# Patient Record
Sex: Male | Born: 1950
Health system: Southern US, Community
[De-identification: ages and names within clinical notes are randomized; demographics above are authoritative.]

## PROBLEM LIST (undated history)

## (undated) DIAGNOSIS — N419 Inflammatory disease of prostate, unspecified: Secondary | ICD-10-CM

## (undated) DIAGNOSIS — M75100 Unspecified rotator cuff tear or rupture of unspecified shoulder, not specified as traumatic: Secondary | ICD-10-CM

## (undated) DIAGNOSIS — R413 Other amnesia: Secondary | ICD-10-CM

## (undated) DIAGNOSIS — E785 Hyperlipidemia, unspecified: Secondary | ICD-10-CM

## (undated) DIAGNOSIS — I10 Essential (primary) hypertension: Secondary | ICD-10-CM

## (undated) DIAGNOSIS — Z87442 Personal history of urinary calculi: Secondary | ICD-10-CM

## (undated) DIAGNOSIS — C801 Malignant (primary) neoplasm, unspecified: Secondary | ICD-10-CM

## (undated) DIAGNOSIS — R569 Unspecified convulsions: Secondary | ICD-10-CM

## (undated) DIAGNOSIS — Z9989 Dependence on other enabling machines and devices: Secondary | ICD-10-CM

## (undated) DIAGNOSIS — T8859XA Other complications of anesthesia, initial encounter: Secondary | ICD-10-CM

## (undated) DIAGNOSIS — F32A Depression, unspecified: Secondary | ICD-10-CM

## (undated) DIAGNOSIS — E119 Type 2 diabetes mellitus without complications: Secondary | ICD-10-CM

## (undated) DIAGNOSIS — G4733 Obstructive sleep apnea (adult) (pediatric): Secondary | ICD-10-CM

## (undated) DIAGNOSIS — C642 Malignant neoplasm of left kidney, except renal pelvis: Secondary | ICD-10-CM

## (undated) DIAGNOSIS — N289 Disorder of kidney and ureter, unspecified: Secondary | ICD-10-CM

## (undated) DIAGNOSIS — R51 Headache: Secondary | ICD-10-CM

## (undated) DIAGNOSIS — N189 Chronic kidney disease, unspecified: Secondary | ICD-10-CM

## (undated) HISTORY — DX: Disorder of kidney and ureter, unspecified: N28.9

## (undated) HISTORY — DX: Unspecified convulsions: R56.9

## (undated) HISTORY — DX: Dependence on other enabling machines and devices: Z99.89

## (undated) HISTORY — DX: Essential (primary) hypertension: I10

## (undated) HISTORY — PX: OTHER SURGICAL HISTORY: SHX169

## (undated) HISTORY — DX: Malignant neoplasm of left kidney, except renal pelvis: C64.2

## (undated) HISTORY — DX: Unspecified rotator cuff tear or rupture of unspecified shoulder, not specified as traumatic: M75.100

## (undated) HISTORY — DX: Obstructive sleep apnea (adult) (pediatric): G47.33

## (undated) HISTORY — DX: Other amnesia: R41.3

## (undated) HISTORY — DX: Inflammatory disease of prostate, unspecified: N41.9

## (undated) HISTORY — DX: Hyperlipidemia, unspecified: E78.5

## (undated) HISTORY — DX: Headache: R51

## (undated) HISTORY — PX: INCISIONAL HERNIA REPAIR: SHX193

## (undated) HISTORY — PX: SKIN CANCER DESTRUCTION: SHX778

## (undated) HISTORY — DX: Malignant (primary) neoplasm, unspecified: C80.1

## (undated) HISTORY — DX: Chronic kidney disease, unspecified: N18.9

---

## 1998-07-30 ENCOUNTER — Emergency Department (HOSPITAL_COMMUNITY): Admission: EM | Admit: 1998-07-30 | Discharge: 1998-07-30 | Payer: Self-pay | Admitting: Emergency Medicine

## 2002-12-06 ENCOUNTER — Ambulatory Visit (HOSPITAL_BASED_OUTPATIENT_CLINIC_OR_DEPARTMENT_OTHER): Admission: RE | Admit: 2002-12-06 | Discharge: 2002-12-06 | Payer: Self-pay | Admitting: Orthopedic Surgery

## 2004-06-24 ENCOUNTER — Ambulatory Visit (HOSPITAL_COMMUNITY): Admission: RE | Admit: 2004-06-24 | Discharge: 2004-06-24 | Payer: Self-pay | Admitting: *Deleted

## 2004-06-24 ENCOUNTER — Encounter (INDEPENDENT_AMBULATORY_CARE_PROVIDER_SITE_OTHER): Payer: Self-pay | Admitting: Specialist

## 2005-02-15 ENCOUNTER — Emergency Department (HOSPITAL_COMMUNITY): Admission: EM | Admit: 2005-02-15 | Discharge: 2005-02-15 | Payer: Self-pay | Admitting: Emergency Medicine

## 2005-02-20 ENCOUNTER — Emergency Department (HOSPITAL_COMMUNITY): Admission: EM | Admit: 2005-02-20 | Discharge: 2005-02-20 | Payer: Self-pay | Admitting: Emergency Medicine

## 2005-10-25 HISTORY — PX: KIDNEY SURGERY: SHX687

## 2006-03-23 ENCOUNTER — Emergency Department (HOSPITAL_COMMUNITY): Admission: EM | Admit: 2006-03-23 | Discharge: 2006-03-24 | Payer: Self-pay | Admitting: Emergency Medicine

## 2006-04-07 ENCOUNTER — Encounter (INDEPENDENT_AMBULATORY_CARE_PROVIDER_SITE_OTHER): Payer: Self-pay | Admitting: Specialist

## 2006-04-07 ENCOUNTER — Inpatient Hospital Stay (HOSPITAL_COMMUNITY): Admission: RE | Admit: 2006-04-07 | Discharge: 2006-04-10 | Payer: Self-pay | Admitting: Urology

## 2006-05-17 ENCOUNTER — Ambulatory Visit (HOSPITAL_COMMUNITY): Admission: RE | Admit: 2006-05-17 | Discharge: 2006-05-17 | Payer: Self-pay | Admitting: Urology

## 2007-07-13 ENCOUNTER — Ambulatory Visit (HOSPITAL_COMMUNITY): Admission: RE | Admit: 2007-07-13 | Discharge: 2007-07-14 | Payer: Self-pay | Admitting: General Surgery

## 2007-07-19 ENCOUNTER — Inpatient Hospital Stay (HOSPITAL_COMMUNITY): Admission: EM | Admit: 2007-07-19 | Discharge: 2007-07-22 | Payer: Self-pay | Admitting: Emergency Medicine

## 2007-12-06 ENCOUNTER — Encounter: Admission: RE | Admit: 2007-12-06 | Discharge: 2007-12-06 | Payer: Self-pay | Admitting: Internal Medicine

## 2008-01-08 ENCOUNTER — Ambulatory Visit (HOSPITAL_COMMUNITY): Admission: RE | Admit: 2008-01-08 | Discharge: 2008-01-08 | Payer: Self-pay | Admitting: *Deleted

## 2009-04-30 DIAGNOSIS — C4491 Basal cell carcinoma of skin, unspecified: Secondary | ICD-10-CM

## 2009-04-30 HISTORY — DX: Basal cell carcinoma of skin, unspecified: C44.91

## 2009-10-29 DIAGNOSIS — D229 Melanocytic nevi, unspecified: Secondary | ICD-10-CM

## 2009-10-29 HISTORY — DX: Melanocytic nevi, unspecified: D22.9

## 2010-04-14 ENCOUNTER — Ambulatory Visit (HOSPITAL_COMMUNITY): Admission: RE | Admit: 2010-04-14 | Discharge: 2010-04-14 | Payer: Self-pay | Admitting: Oncology

## 2010-11-15 ENCOUNTER — Encounter: Payer: Self-pay | Admitting: Internal Medicine

## 2011-03-09 NOTE — Discharge Summary (Signed)
NAMEMarland Kitchen  ABDO, DENAULT NO.:  [id number]   MEDICAL RECORD NO.:  [id number]          PATIENT TYPE:  INP   LOCATION:  1417                         FACILITY:  [facility]   PHYSICIAN:  Madaline Savage, MD        DATE OF BIRTH:  [birth year]   DATE OF ADMISSION:  07/19/2007  DATE OF DISCHARGE:  07/22/2007                               DISCHARGE SUMMARY   PRIMARY CARE PHYSICIAN:  Massie Maroon, M.D.   CONSULTATION:  He was seen by Dr. Lesia Sago from neurology.   DISCHARGE DIAGNOSES:  1. Altered mental status secondary to seizures.  2. Seizure disorder.  3. Dyslipidemia.  4. Pulmonary nodule.  5. History of renal cell carcinoma.   DISCHARGE MEDICATIONS:  1. Flomax 0.4 mg daily.  2. Prozac 20 mg daily.  3. Zocor 40 mg daily.  4. Lamictal 200 mg once daily.  5. Keppra 500 mg 3 times daily.  6. Norvasc 10 mg once daily.  7. Zithromax 250 mg daily for 3 more days.  8. Vicodin 5/325 1 tablet twice daily as needed.  9. Ambien 10 mg at bedtime as needed.   HISTORY OF PRESENT ILLNESS:  For full history and physical, see the  history and physical dictated by Dr. Christella Noa on July 19, 2007.  In  short, Mr. Mendiola is a 60 year old gentleman with a history of  depression and seizures who was brought to the ED by his wife with acute  confusion, unsteady gait, and lightheadedness.  He was recently  discharged from the hospital on July 13, 2007 by Dr. Abbey Chatters  after he had a lumbar hernia surgery.  He was apparently on narcotic  pain medications for it.  He is admitted with a diagnosis of altered  mental status for further evaluation and management.   PROCEDURES PERFORMED:  1. He had a CT of the head with and without contrast done on July 19, 2007 which showed no acute abnormalities, but it did show      paranasal sinus disease.  2. He had a CT of the chest without contrast done on July 20, 2007 which showed mild pulmonary edema and stable left  lower      nodule.  3. He had an MRI and MRA of the brain done July 20, 2007 which      showed no acute ischemic event, no occlusions, stenosis, or      dissection in the intracranial vessels noted.  4. He had an EEG done on July 20, 2007 which showed abnormal slow      EEG with a central dominant rhythm of about 6 Hz and high amplitude      sharp wave and occasionally spike wave discharges which appeared      bilaterally frontal and frontopolar dominant.  This is a clearly      abnormal EEG.   PROBLEMS:  1. Altered mental status.  Mr. Kielty altered mental status      resolved in the first 24-48 hours.  This is deemed secondary to  his      seizure.  He had an MRI/MRA which did not show any ischemic event.  2. Seizure disorder.  He was seen by neurology, and he was started on      Keppra.  He was on Lamictal at home.  His Lamictal dose is at 200      mg a day at this time, and the plan is to titrate it up to 250 mg.      He is on Keppra 500 mg 3 times daily.  We will check a Lamictal      level as an outpatient in a week's time.  3. Sinusitis.  He did have some nasal discharge, and he had a CT of      the head which showed paranasal sinusitis.  He was started on      Zithromax.  He will be continued on it for 3 more days.  4. Hypertension.  His blood pressure was elevated on admission, and he      was started on Norvasc.  His blood pressure is well-controlled on      his current dose of Norvasc.  We will continue it at this time.   DISPOSITION:  He is now being discharged home in stable condition.   FOLLOW UP:  He is asked to follow up with his primary care doctor, Dr.  Selena Batten, in 1 week.  He also asked to follow up with his neurologist, Dr.  Anne Hahn, in 1-2 weeks.  He is recommend to get a Lamictal level checked  before he sees Dr. Anne Hahn.      Madaline Savage, MD  Electronically Signed     PKN/MEDQ  D:  07/22/2007  T:  07/23/2007  Job:  [id number]   cc:   Massie Maroon,  MD  Fax: [phone]   C. Lesia Sago, M.D.  Fax: [phone]   Adolph Pollack, M.D.  [address]  [place]  [place] [address]

## 2011-03-09 NOTE — Op Note (Signed)
NAME:  REEDY, BIERNAT NO.:  [id number]   MEDICAL RECORD NO.:  [id number]          PATIENT TYPE:  AMB   LOCATION:  ENDO                         FACILITY:  [facility]   PHYSICIAN:  Georgiana Spinner, M.D.    DATE OF BIRTH:  [birth year]   DATE OF PROCEDURE:  01/08/2008  DATE OF DISCHARGE:                               OPERATIVE REPORT   PROCEDURE:  Upper endoscopy.   INDICATIONS:  Hemoccult positivity.   ANESTHESIA:  Fentanyl 75 mcg, Versed 7.5 mg.   DESCRIPTION OF PROCEDURE:  With the patient mildly sedated in the left  lateral decubitus position the Pentax videoscopic endoscope was inserted  in the mouth, and passed under direct vision through the esophagus,  which appeared normal; into the stomach.  Fundus, body, antrum, duodenal  bulb, and second portion of the duodenum all appeared normal.  From this  point the endoscope was slowly withdrawn taking circumferential views of  duodenal mucosa until the endoscope had been pulled back, and the  stomach placed in retroflexion to view the stomach from below.  The  endoscope was then straightened and withdrawn taking circumferential  views of the remaining gastric and esophageal mucosa; stopping, in the  fundus, to photograph an area where there was a small fleck of blood  seen, but without any underlying pathology.  The endoscope was withdrawn  taking circumferential views of the remaining gastric and esophageal  mucosa.  The patient's vital signs, and pulse oximeter remained stable.  The patient tolerated the procedure well without apparent complications.   FINDINGS:  A small amount of blood seen in the stomach etiology of which  was not clear from this exam.   PLAN:  Proceed to colonoscopy.           ______________________________  Georgiana Spinner, M.D.     GMO/MEDQ  D:  01/08/2008  T:  01/08/2008  Job:  [id number]

## 2011-03-09 NOTE — Procedures (Signed)
EEG NUMBER:  [id number].   This patient is followed by Dr. Lesia Sago as a neurologic consultant.  The primary attending is not mentioned. This is a portable EEG study on  this patient without hyperventilation and performed without photic  stimulation maneuvers.  The patient is described as admitted with  confusion, disorientation, poor mobility and weakness.  The mental  status changes were the reason for the EEG evaluation.   CURRENT MEDICATIONS:  1. Lovenox.  2. Aspirin.  3. Protonix.  4. Zocor.  5. Aggrenox.  6. Norvasc.  7. Lamictal.  8. Phenergan.  9. Vasotec.  10.Ativan.  11.Oxycodone.  12.Prozac.   DESCRIPTION:  A posterior dominant rhythm cannot be established.  This  EEG shows an ongoing seizure activity with high amplitude sharp waves as  well as artifact over the left temporal region.  Remarkable is that the  EKG remains in normal sinus rhythm throughout at about 80-88 beats per  minute.  High amplitude discharges are seen throughout the EEG recording  in transverse, bilateral and referential montages.  There is also  intermittent movement noticed which the technician noted as feet  movement.  She did not notice any convulsions.   CONCLUSION:  This is an abnormal slow EEG with a central dominant rhythm  of about 6 Hz and high amplitude sharp wave and occasionally spike wave  discharges which appear bilaterally frontal and frontal polar dominant.  This is clearly an abnormal EEG.      Melvyn Novas, M.D.  Electronically Signed     EA:VWUJ  D:  07/20/2007 19:33:41  T:  07/21/2007 12:09:58  Job #:  [id number]   cc:   Marlan Palau, M.D.  Fax: [phone]

## 2011-03-09 NOTE — Op Note (Signed)
NAME:  JOHNAVON, MCCLAFFERTY NO.:  [id number]   MEDICAL RECORD NO.:  [id number]          PATIENT TYPE:  AMB   LOCATION:  ENDO                         FACILITY:  [facility]   PHYSICIAN:  Georgiana Spinner, M.D.    DATE OF BIRTH:  1951-01-13   DATE OF PROCEDURE:  DATE OF DISCHARGE:                               OPERATIVE REPORT   PROCEDURE:  Colonoscopy.   INDICATIONS:  Colon cancer screening, hemoccult positivity.   ANESTHESIA:  Fentanyl 50 mcg, Versed 5 mg.   PROCEDURE:  With the patient mildly sedated in the left lateral  decubitus position, a rectal exam was performed which was unremarkable.  The prostate felt normal.  Subsequently, the Pentax videoscopic  colonoscope was inserted in the rectum and passed under direct vision to  the cecum, identified by ileocecal valve and appendiceal orifice, both  of which were photographed.  From this point the colonoscope was slowly  withdrawn, taking circumferential views of the colonic mucosa, stopping  only in the rectum which appeared normal on direct and retroflexed view.  The endoscope was straightened and withdrawn.  The patient's vital signs  and pulse oximeter remained stable.  The patient tolerated the procedure  well without apparent complication.   FINDINGS:  Negative colonoscopic examination.   PLAN:  Consider repeat examination in 5-10 years.           ______________________________  Georgiana Spinner, M.D.     GMO/MEDQ  D:  01/08/2008  T:  01/08/2008  Job:  [id number]

## 2011-03-09 NOTE — H&P (Signed)
NAMESLOAN, TAKAGI NO.:  [id number]   MEDICAL RECORD NO.:  [id number]          PATIENT TYPE:  INP   LOCATION:  1417                         [facility]ACILITY:  [facility]   PHYSICIAN:  Herbie Saxon, MDDATE OF BIRTH:  08-20-1951   DATE OF ADMISSION:  07/19/2007  DATE OF DISCHARGE:                              HISTORY & PHYSICAL   PRIMARY CARE PHYSICIAN:  Massie Maroon, M.D.   PRESENTING COMPLAINT:  Confusion of 1 day duration.   HISTORY OF PRESENTING COMPLAINT:  This is a 60 year old Caucasian male  who was quite well, according to wife, until this morning, when he  became suddenly acutely confused, associated with unsteadiness of gait,  lightheadedness, and dizziness.  There was no weakness or seizure.  The  patient has a past medical history of multiple partial  seizures, and  wife has noticed that the patient has been having auditory and  visualizations for the last 3 days, which she was attributing to the  oxycodone he was placed on post his hernia surgery.  The patient was  just discharged from the hospital on July 13, 2007, status post  left lumbar hernia surgery by Dr. Zoila Shutter.  He had been quite well  until 2 days ago, when he was having constipation.  He used 2 laxative  tablets, and then subsequently developed diarrhea.  He had been having,  according to the wife, low-grade fever associated cough with blood-  streaked mucoid phlegm in the last 3 days.  No wheezing, no shortness of  breath, no syncopal episodes.  The patient has not been complaining of  headache, blurred vision, or difficulty with hearing to her.   PAST MEDICAL HISTORY:  1. Kidney cancer.  2. Depression.  3. Seizures.  4. BPH.  5. Chronic prostatitis.   PAST SURGERIES:  Left kidney removal in 2007.   FAMILY HISTORY:  Father died of throat cancer.  Mother is alive.  She  has breast cancer.   The patient has been married 34 years.  He is an Equities trader.  There are no  kids.   Note:  The patient is now recognizing the wife.  He is still confused  and is only saying I don't know to all the questions presented to him.  Review of systems could not be obtained, as the patient is too confused  and has no memory of recent events.  There are no signs of lip or tongue  biting or trauma to the extremities.   The patient has been diagnosed in the last 1 year with elevated blood  pressure but has not been put on any medications for this.   There are no known drug allergies.   MEDICATIONS AT HOME:  1. Fluoxetine 20 mg daily.  2. Zocor 40 mg daily.  3. Oxycodone/APAP 5/500, 1 q.6 h. p.r.n.   PHYSICAL EXAMINATION:  GENERAL:  He is an elderly man, not in acute  respiratory distress.  He is confused, pleasantly.  Currently not  agitated.  He is not oriented in time, place, or person.  VITAL SIGNS:  Temperature is 98, pulse  is 86, respiratory rate is 20,  blood pressure 155/103.  HEENT:  Right pupil is 2 mm, left pupil is 4 mm, sluggishly reacting.  NECK:  Supple.  CHEST:  Clear.  HEART:  Heart sounds 1 and 2 regular.  No murmurs.  ABDOMEN : There is left lumbar ecchymosis at the surgery site.  Mild  bulge in the left lumbar region.  No organomegaly.  Bowel sounds are  normoactive.  NEUROLOGIC:  Cranial nerves and sensory system could not be tested.  Power is 5 globally.  Deep tendon reflexes 2 globally.  There is no  dysarthria.  There is no obvious nystagmus or ataxia.  SKIN:  There are no skin ulcers, no pedal edema.   Available labs showed urine toxicology is negative.  Alcohol level is  less than 5.  Chest x-ray shows mild peribronchial thickening, otherwise  normal.  EKG shows normal sinus rhythm at 78 per minute, with left  atrial enlargement.  WBC is 10, hematocrit 41, platelet count is 343.  Urinalysis is negative.  Troponin is less than 0.05.  Chemistry shows a  sodium of 140, potassium 4.7, chloride 104, bicarbonate 24, BUN 22,  creatinine 1.3,  glucose is 134.   ASSESSMENT:  Altered mental status, rule out seizure ?brain metastases .  Rule out psychiatric manifestation.  Query underlying cerebrovascular  accident.  Rule out brain metastases.  Rule out lung metastases from the  kidney cancer.  The patient is to be admitted to a telemetry bed.  Will  obtain a CT brain and CT chest.  Consider doing MRI brain and MRA head  and neck.  His diet will be low cholesterol, cardiac.  IV fluids of  normal saline at 25 cc/hour.  Will obtain ESR, RPR, and Hemoccult.  Will  get a sputum analysis and culture.  Will put him on Aggrenox 1 tablet  b.i.d.  Will continue with his fluoxetine 20 mg p.o. daily.  Will get  physical therapy and speech therapy to evaluate him.  Hold off with his  oxycodone.  Tylenol 650 mg p.r.n. for pain.  DVT prophylaxis with  Lovenox 40 mg subcutaneously daily, and Phenergan 12.5 mg IV q.8 h.  p.r.n. nausea.  Start him on amlodipine 10 mg daily, and enalapril 2.5  mg IV q.6 h. p.r.n. for blood pressure greater than 160/109.  Will  obtain an EEG.  Consider neurology and psychiatric input after reviewing  the labs and radiology tests.  Rule out viral encephalitis.  Will  consider starting the patient on Rocephin and acyclovir if  neurologically not improving.  Possibly also consider doing a lumbar  puncture for CSF analysis.      Herbie Saxon, MD  Electronically Signed     MIO/MEDQ  D:  07/19/2007  T:  07/20/2007  Job:  [id number]   cc:   Massie Maroon, MD  Fax: [phone]

## 2011-03-09 NOTE — Op Note (Signed)
NAME:  AVYUKT, CIMO NO.:  [id number]   MEDICAL RECORD NO.:  [id number]          PATIENT TYPE:  AMB   LOCATION:  DAY                          FACILITY:  [facility]   PHYSICIAN:  Adolph Pollack, M.D.DATE OF BIRTH:  30-Apr-1951   DATE OF PROCEDURE:  07/13/2007  DATE OF DISCHARGE:                               OPERATIVE REPORT   PREOPERATIVE DIAGNOSIS:  Left flank ventral incisional hernia.   POSTOPERATIVE DIAGNOSIS:  Left flank ventral incisional hernia.   PROCEDURE:  Laparoscopic repair of left flank incisional hernia with  mesh and mobilization of splenic flexure.   SURGEON:  Adolph Pollack, M.D.   ASSISTANT:  Leonie Man, M.D.   ANESTHESIA:  General.   INDICATION:  The patient is a 60 year old male who underwent a left  nephrectomy for renal cell carcinoma.  He had been noticing a bulge in  the incision and he states he can hear gurgling out of it sometimes.  On  examination, he has an obvious hernia and upon reduction, it appears  that his bowel is being reduced.  He now presents for laparoscopic,  possible open repair.   TECHNIQUE:  He was seen in the holding area and then brought to the  operating room, placed supine on the operating table.  General  anesthetic was administered.  He was then placed into the right lateral  decubitus position and padded appropriately.  The hair on the abdominal  wall and flank area was clipped and then the abdominal wall flank and  left back were sterilely prepped and draped.  The patient was then  turned into a near supine position and a transverse incision was made in  the supraumbilical region through the skin and subcutaneous tissue.  The  midline fascia was identified and a small incision made in it and the  peritoneal cavity was entered.  A pursestring suture of 0 Vicryl was  placed around the fascial edges.  A Hassan trocar was introduced into  the peritoneal cavity and the pneumoperitoneum was created by  insufflation of CO2 gas.   Next, the angled laparoscope was introduced.  I then placed a 5 mm  trocar into the left lower quadrant region and one into the upper  midline just to the left of the midline.  I identified one hernia  defect.  The patient was then placed back in the decubitus position.  I  perform adhesiolysis of some of the omentum that was around the hernias.  I then identified the left colon and found that part of it was going up  into a second hernia which was more lateral.  There was a medial defect  with a normal fascial bridge and then a lateral defect present.  Using  careful sharp and blunt dissection, I dissect the colon free from the  lateral hernia without injuring it.  I then dissected some of the  extraperitoneal fat away from the rim of both hernias.  I end up having  to mobilize the splenic flexure of the colon in order to have an area of  abdominal wall for anchoring purposes.  I did this sharply and bluntly  without injury to the spleen or colon.  I then lysed more adhesions of  scar exposing more abdominal wall laterally.  Once I had both hernias  identified with adequate abdominal wall around them for overlap, I then  brought a piece of Parietex mesh with a nonadherent barrier into the  field measuring 20 x 15.  I placed four anchoring sutures of #1 Novofil  at four quadrants of the mesh.  I then hydrated the mesh and then placed  it into abdominal cavity, positioned it so that the rough side was  facing upward and the nonadherent barrier side was facing the viscera.  I then made four stab incisions.  The superior stab incision was made  just over the twelfth rib and I had the anesthetist hold  him in end  expiration for this.  Through the four stab incisions, I threaded up the  stay sutures across a fascial bridge and then tied these down, initially  anchoring it to the abdominal wall.  I then further anchored to the mesh  to the abdominal wall with spiral  tacks of both the outer and inner  layer.  This provided for more than adequate overlap and good anchoring.   I then irrigated the area and inspected the colon once again and small  bile and noticed no intestinal injury.  There was no bleeding noted.  I  then evacuated the irrigation fluid and released the CO2 and watch the  viscera approximate the mesh.  Trocars were then removed.   The supraumbilical fascial defect was closed by tightening up and tying  down the pursestring tissue.  All incisions were closed with 4-0  Monocryl subcuticular stitches followed by sterile dressings.   He tolerated the procedure well without any apparent complications and  was taken to the recovery room in satisfactory condition.      Adolph Pollack, M.D.  Electronically Signed     TJR/MEDQ  D:  07/13/2007  T:  07/13/2007  Job:  [id number]   cc:   Veverly Fells. Vernie Ammons, M.D.  Fax: [phone]   Janae Bridgeman. Eloise Harman., M.D.  Fax: [phone]

## 2011-03-09 NOTE — Consult Note (Signed)
NAME:  Kenneth Ortega, Kenneth Ortega NO.:  [id number]   MEDICAL RECORD NO.:  [id number]          PATIENT TYPE:  INP   LOCATION:  1417                         FACILITY:  [facility]   PHYSICIAN:  Marlan Palau, M.D.  DATE OF BIRTH:  01/28/1951   DATE OF CONSULTATION:  07/19/2007  DATE OF DISCHARGE:                                 CONSULTATION   HISTORY OF PRESENT ILLNESS:  The patient is a 59 year old right-handed  white male born 01/17/1951, with a history of seizure disorder  and renal cell carcinoma status post resection.  This patient comes to  the [facility] Emergency Room after being noted to be somewhat  confused this morning.  Onset was sometime between 5:30 a.m. and 7  o'clock a.m.  The patient has not noted any numbness or weakness of arms  or legs.  Denies headache, but has had significant confusion that  mainly manifests itself with language disorder with dysnomia  perseveration.  Patient has undergone a CT scan of the brain that is  unremarkable.  He is admitted for evaluation.  The patient has been on  Lamictal prior to coming in.  The wife is not sure with the dosing  regimen is.  Neurology is called for an evaluation.   PAST MEDICAL HISTORY:  Significant for  1. Acute onset of confusion and aphasia, rule out stroke versus      seizure.  2. History of seizures.  3. Hernia repair a week ago.  4. Nephrectomy secondary to renal cell carcinoma on the left.  5. Benign prostatic hypertrophy.  6. Renal calculi.   MEDICATIONS:  1. Prozac 20 mg daily.  2. Zocor 40 mg daily.  3. Tylox p.r.n.  4. Lamictal unknown dose.   The patient does not smoke or drink,   ALLERGIES:  No known allergies.   SOCIAL HISTORY:  Patient is married, has no children, lives in the  [place], [place], area.   FAMILY MEDICAL HISTORY:  Notable that mother is alive with rheumatoid  arthritis and breast cancer.  Father died with throat cancer.  The  patient has one  brother and two sisters, all alive and well, some have  borderline diabetes.   REVIEW OF SYSTEMS:  Notable for no fevers, chills.  Denies headache,  vision changes, trouble swallowing, chest pain, shortness of breath,  abdominal pain, problems controlling bowels or bladder, numbness,  weakness on the arms or legs.   PHYSICAL EXAMINATION:  VITALS:  Blood pressure is 153/97, heart rate 85,  respiratory 20, temperature afebrile.  GENERAL:  This patient is a fairly well-developed white male who is  alert, cooperative at the time of examination.  HEENT:  Head is atraumatic.  Eyes:  Pupils equal, round and react to  light.  Disks are flat bilaterally.  NECK:  Supple.  No carotid bruits noted.  RESPIRATORY:  Examination is clear.  CARDIOVASCULAR:  Regular rate and rhythm.  No obvious murmurs or rubs  noted.  EXTREMITIES:  Without significant edema.  NEUROLOGIC:  Cranial nerves as above.  Facial symmetry is present.  The  patient has good sensation of the face to pinprick, soft touch  bilaterally.  Has good strength to facial muscles, muscles to head  turning, shrug bilaterally.  Speech is well enunciated but is at times  nonsensical, a lot of paraphasic errors, dysnomias.  Spontaneous speech  is significantly impaired.  Repetition is good, however.  Naming is very  poor.  The patient appears to have full extraocular movements, visual  fields are full.  Motor testing shows 5/5 strength in all fours.  Good  symmetric motor tone is noted throughout.  Sensory testing is intact to  pinprick, soft touch, vibratory sensation throughout.  The patient has  fair finger-nose-finger and toe-to-finger bilaterally.  Gait was not  tested.  Deep tendon reflexes depressed but symmetric.  Toes neutral  bilaterally.  No drift is seen in the upper extremities.  The patient  has a lot of perseveration.  The patient could not state place or date.   LABORATORY VALUES:  Notable for white count of 10, hemoglobin  of 14.2,  hematocrit 41.4, MCV 89.3, platelets of 343, sed rate of 52.  INR 1.1.  Sodium 140, potassium of 4.7, chloride of 104, CO2 24, glucose 134, BUN  of 22, creatinine 1.32.  Total bilirubin 0.5, alkaline phosphatase of  89, SGOT 27, SGPT of 30, total protein 7.6, albumin 3.7, calcium 9.5,  phos of 3.4, magnesium 2.3.  CK of 46, MB fraction 1.4, troponin-I less  than 0.05.  Urine drug screen negative.  Alcohol level less than 5.  Urinalysis reveals specific gravity of 1.020, pH of 7.   CT of the head is as above.   IMPRESSION:  Onset of confusion/aphasia.   This patient does have a history of seizures.  The patient is on  Lamictal, dose is not known.  Will need to evaluate this patient for  possible stroke or a postictal confusion.  Main deficit currently is an  aphasia/dysnomia syndrome.   PLAN:  1. Will obtain MRI of the brain.  2. MRI angiogram of intracranial vessels.  3. EEG study.  4. Continue Lamictal.  5. Will follow the patient's clinical course while in-house.  Will      need to discontinue the Depakote as this interacts significantly      with the Lamictal.  Will follow the patient's clinical course.      Marlan Palau, M.D.  Electronically Signed     CKW/MEDQ  D:  07/19/2007  T:  07/20/2007  Job:  [id number]   cc:   [facility]  [address]  [address].

## 2011-03-12 NOTE — Discharge Summary (Signed)
NAMEMarland Kitchen  Kenneth Ortega, Kenneth Ortega NO.:  [id number]   MEDICAL RECORD NO.:  [id number]          PATIENT TYPE:  INP   LOCATION:  1430                         FACILITY:  [facility]   PHYSICIAN:  Mark C. Vernie Ammons, M.D.  DATE OF BIRTH:  10-26-1950   DATE OF ADMISSION:  04/07/2006  DATE OF DISCHARGE:  04/10/2006                                 DISCHARGE SUMMARY   PRINCIPAL DIAGNOSIS:  Renal cell carcinoma of the left kidney.   OTHER DIAGNOSIS:  Benign prostatic hypertrophy with bladder outlet  obstruction.   MAJOR OPERATION:  Left radical nephrectomy.   DISPOSITION:  The patient is discharged home in stable and satisfactory  condition.  He is overall improved.  He is voiding without difficulty.  His  discharge medications will include his preadmission admission medications  with no change except for the addition of Demerol 50 mg tabs one to two  q.4h. p.r.n. pain, #40, and Flomax 0.4 mg daily.  Follow-up will be in my  office in 1 week for skin staple removal.  His activity will be limited to  no heavy lifting, straining or vigorous activity.  His diet will be  unrestricted.   BRIEF HISTORY:  The patient is a 60 year old white male who was found on CT  scan to have a left renal mass.  The scan was performed for left flank pain.  The mass appeared to be enhancing and was without evidence of any evidence  of metastatic disease.  He was therefore electively admitted for left  radical nephrectomy.  His entire history and physical was previously  dictated and will not be repeated at this time.   HOSPITAL COURSE:  On April 07, 2006, he was taken to the operating room,  where he underwent left radical nephrectomy without difficulty.  The evening  of his surgery his Foley was draining clear and his incision was intact, and  by the following morning his creatinine had elevated slightly to 1.6 from  his admission creatinine of 1.0.  Hemoglobin was stable at 12.2.  The  remainder of his  laboratory results were unremarkable.  By the following  morning he was doing well but his Foley catheter was removed and was he  unable to void the previous day, so I&O catheterization was reformed.  The  Foley was then replaced and Flomax was started.  He then underwent repeat  voiding trial on the following morning and voided without difficulty.  His  hemoglobin remained stable.  His creatinine on discharge was 1.5.  His  incision was healing well without any sign of infection.  He was felt ready  for discharge at that time.      Mark C. Vernie Ammons, M.D.  Electronically Signed     MCO/MEDQ  D:  05/17/2006  T:  05/17/2006  Job:  [id number]

## 2011-03-12 NOTE — Op Note (Signed)
NAME:  Kenneth Ortega, Kenneth Ortega NO.:  [id number]   MEDICAL RECORD NO.:  [id number]                   PATIENT TYPE:  AMB   LOCATION:  DSC                                  FACILITY:  [facility]   PHYSICIAN:  Loreta Ave, M.D.              DATE OF BIRTH:  08/14/1951   DATE OF PROCEDURE:  DATE OF DISCHARGE:                                 OPERATIVE REPORT   PREOPERATIVE DIAGNOSIS:  Left knee medial meniscal tear.   POSTOPERATIVE DIAGNOSIS:  Left knee medial lateral meniscal tear with grade  2-3 chondromalacia patella and medial femoral condyle.   PROCEDURE:  1. Left knee examination under anesthesia.  2. Arthroscopy.  3. Partial medial and lateral meniscectomy.  4. Chondroplasty of the medial and femoral condyle and patella.   SURGEON:  Dr. Loreta Ave.   ANESTHESIA:  Knee block with sedation.   SPECIMENS:  None.   CULTURES:  None.   COMPLICATIONS:  None.   DRESSINGS:  Sterile compressive.   DESCRIPTION OF PROCEDURE:  The patient was brought to the operating room,  placed on the operating table in the supine position.  After adequate  anesthesia had been obtained, the left knee was examined. Full motion, good  stability, good patellofemoral tracking, minimal crepitus of the  patellofemoral joint.  No instability.  Tourniquet and leg holder applied,  the leg was prepped and draped in the usual sterile fashion.  Three portals  were created, one superolateral, one each medial and lateral parapatellar.  Inflow catheter induced.  Knee distended.  Arthroscope introduced and  inspected.  Local grade 2-3 chondromalacia in the patella and over towards  the medial facet, treated with chondroplasty to a stable surface.  Good  tracking.  Trochlea looked good.  Hypertrophic synovitis removed.  No  substantial medial plica.  Medial compartment had grade 2-3 changes, weight  bearing dome medial femoral condyle, treated with chondroplasty.  Medial  meniscus  otherwise had extensive tearing over the posterior half, multiple  displaced removable fragments.  Posterior __________ with a shaver to a  stable rim.  Tapered into remaining meniscus, salvaging much in the anterior  half.  Lateral meniscus had complex tearing with a flap off the posterior  third and a radial tear in the middle third.  These were both saucerized  out, tapered down smoothly, retaining a fair amount of meniscus at  completion.  No significant degenerative changes laterally.  Cruciate  ligament was intact.  The entire knee was examined, no other findings were  appreciated.  Instruments were completely  removed.  Portals of the knee were injected with Marcaine.  Portals were  closed with 4-0 nylon.  Sterile compressive dressing applied.  Anesthesia  reversed.  Brought to the recovery room.  Tolerated the surgery well with no  complications.  Loreta Ave, M.D.    DFM/MEDQ  D:  12/06/2002  T:  12/06/2002  Job:  [id number]

## 2011-03-12 NOTE — Op Note (Signed)
NAME:  Kenneth Ortega, Kenneth Ortega NO.:  [id number]   MEDICAL RECORD NO.:  [id number]                   PATIENT TYPE:  AMB   LOCATION:  ENDO                                 FACILITY:  [facility]   PHYSICIAN:  Georgiana Spinner, M.D.                 DATE OF BIRTH:  June 26, 1951   DATE OF PROCEDURE:  06/24/2004  DATE OF DISCHARGE:                                 OPERATIVE REPORT   PROCEDURE:  Colonoscopy.   INDICATIONS:  Colon polyps.   ANESTHESIA:  An additional Demerol 30, Versed 8 mg.   DESCRIPTION OF PROCEDURE:  With patient mildly sedated in the left lateral  decubitus position, a rectal examination was performed which was  unremarkable.  Subsequently, the Olympus videoscopic colonoscope was  inserted in the rectum and passed under direct vision to the cecum,  identified by the ileocecal valve and appendiceal orifice.  In the cecum  were 2 polyps that were photographed and removed using snare cautery  technique setting of 20/200 blended current.  Both were suctioned into the  endoscope and retrieved.  The endoscope was then withdrawn, taking  circumferential views of the remaining colonic mucosa, stopping in the  descending colon where a polyp was seen, photographed, and removed using hot  biopsy forceps technique with the same setting, and then also we stopped in  the rectum where a polyp was seen just above the anal verge, and it too was  photographed and removed using hot biopsy forceps technique.  The endoscope  was then placed in retroflexion to view the anal canal from above.  It was  then straightened and withdrawn, taking circumferential views of the  remaining mucosa.  The patient's vital signs and pulse oximeter remained  stable.  The patient tolerated the procedure well without apparent  complications.   FINDINGS:  Polyp at cecum, descending colon, rectum.   PLAN:  1.  Await biopsy report.  2.  The patient will call me for results and follow up with me as  an      outpatient.                                               Georgiana Spinner, M.D.    GMO/MEDQ  D:  06/24/2004  T:  06/24/2004  Job:  [id number]
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
## 2011-03-12 NOTE — Op Note (Signed)
NAME:  BJ, MORLOCK NO.:  [id number]   MEDICAL RECORD NO.:  [id number]                   PATIENT TYPE:  AMB   LOCATION:  ENDO                                 FACILITY:  [facility]   PHYSICIAN:  Georgiana Spinner, M.D.                 DATE OF BIRTH:  09-25-1951   DATE OF PROCEDURE:  06/24/2004  DATE OF DISCHARGE:                                 OPERATIVE REPORT   PROCEDURE:  Upper endoscopy.   INDICATIONS:  Abdominal pain.   ANESTHESIA:  1.  Demerol 50 mg.  2.  Versed 7 mg.   DESCRIPTION OF PROCEDURE:  With patient mildly sedated in the left lateral  decubitus position, the Olympus videoscopic endoscope was inserted in the  mouth, passed under direct vision through the esophagus, which appeared  normal, into the stomach.  Fundus, body, antrum, duodenal bulb, and second  portion of duodenum appeared normal.  From this point, the endoscope was  slowly withdrawn, taking circumferential views of duodenal mucosa until the  endoscope then pulled back into the stomach, placed in retroflexion to view  the stomach from below.  The endoscope was straightened and withdrawn,  taking circumferential views of the remaining gastric and esophageal mucosa.  The patient's vital signs and pulse oximeter remained stable.  The patient  tolerated the procedure well without apparent complications.   FINDINGS:  Unremarkable examination.   PLAN:  Proceed to colonoscopy.                                               Georgiana Spinner, M.D.    GMO/MEDQ  D:  06/24/2004  T:  06/24/2004  Job:  [id number]

## 2011-03-12 NOTE — Op Note (Signed)
NAMEMarland Kitchen  BRAHM, BARBEAU NO.:  [id number]   MEDICAL RECORD NO.:  [id number]          PATIENT TYPE:  INP   LOCATION:  1430                         FACILITY:  [facility]   PHYSICIAN:  Mark C. Vernie Ammons, M.D.  DATE OF BIRTH:  July 02, 1951   DATE OF PROCEDURE:  04/07/2006  DATE OF DISCHARGE:                                 OPERATIVE REPORT   PREOPERATIVE DIAGNOSIS:  Left renal mass.   POSTOPERATIVE DIAGNOSIS:  Left renal mass.   PROCEDURE:  Left radical nephrectomy.   SURGEON:  Mark C. Vernie Ammons, M.D.   ASSISTANT SURGEON:  Terie Purser, M.D.   ANESTHESIA:  General endotracheal.   COMPLICATIONS:  None.   DRAINS:  16-French Foley catheter to straight drain.   ESTIMATED BLOOD LOSS:  200 mL.   SPECIMENS:  Left kidney and adrenal to pathology.   DISPOSITION:  Stable to postanesthesia care unit.   INDICATION FOR PROCEDURE:  Mr. Lindeman is a 60 year old male, who had a  recent episode of left flank pain.  He underwent evaluation and was found to  have an approximate 3.5-cm enhancing renal mass, involving the left upper  pole.  This tumor appeared to extend deep into the parenchyma, abuting the  hilum.  Because of the above, the patient was offered a left radical  nephrectomy.  The benefits and risks of the procedure were explained and his  consent was obtained.   DESCRIPTION OF PROCEDURE:  The patient was brought to the operating room and  properly identified.  A time out was performed to determine correct patient,  procedure, and side.  He was administered general anesthesia, given  preoperative antibiotics, then placed in a left flank position and prepped  and draped in a sterile fashion.  Careful attention was paid to proper  positioning to eliminate DVT, rhabdomyolysis, or neuropraxia.  We then made  an approximate 12-cm subcostal incision on the left.  Dissection was carried  down through the subcutaneous tissues.  The muscles were divided in three  layers.  Our  dissection was just off of the 12th rib.  We incised the  lumbodorsal fascia and entered the retroperitoneum.  The peritoneum was  dissected medially.  We then identified the left kidney and mobilized it  posterolaterally.  We then worked our way inferiorly to identify the ureter  and the gonadal vessel.  The ureter and gonadal were doubly clipped and  divided.  We then used the ureter to work our way superiorly to the renal  hilum.  We encountered the renal vein first, and this was carefully  dissected free and isolated with a vessel loop.  We were then able to  isolate the renal artery after mobilizing the kidney anteriorly and came at  the artery from the posterior side of the kidney.  We were able to isolate  the single renal artery.  This was ligated with 0-silk suture and clipped  proximally and then divided.  When we had taken the artery, we then doubly  ligated the renal vein, clipped it proximally, and divided the vein.  We  then continued our mobilization  of the kidney outside of Gerota's fascia  superiorly.  This was somewhat difficult, given our incision, but we were  able to free up the kidney with blunt dissection with the Bovie  electrocautery.  The kidney was then removed, without difficulty.  Careful  inspection of the surgical bed did reveal the adrenal gland.  This was  carefully dissected free of its attachments and the vasculature was clipped  and divided and the left adrenal was passed off with the specimen.  We then  copiously irrigated the surgical bed.  Hemostasis was excellent.  Upon  examination, we did find a small opening in the peritoneum.  This was closed  in a running fashion with a 3-0 Chromic suture.  We then removed our  retractors and proceeded to close the wound.  The muscle layers were closed  in two layers with 1-0 PDS.  The skin was then closed with staples.  There  were no complications.  All sponge and needle counts were correct x2 at the  end of  the case.  A sterile dressing was applied.  The patient was awoken  from anesthesia and transported to the recovery room in stable condition.  There were no complications.   Please note that Dr. Vernie Ammons was present and participated in all aspects of  this procedure, as he is primary [employer] consultant.     ______________________________  Terie Purser, MD      Veverly Fells. Vernie Ammons, M.D.     JH/MEDQ  D:  04/07/2006  T:  04/07/2006  Job:  [id number]

## 2011-06-29 ENCOUNTER — Other Ambulatory Visit: Payer: Self-pay | Admitting: Internal Medicine

## 2011-06-29 DIAGNOSIS — M25512 Pain in left shoulder: Secondary | ICD-10-CM

## 2011-07-03 ENCOUNTER — Ambulatory Visit
Admission: RE | Admit: 2011-07-03 | Discharge: 2011-07-03 | Disposition: A | Discharge status: Home / Self Care | Payer: BC Managed Care – PPO | Source: Ambulatory Visit | Attending: Internal Medicine | Admitting: Internal Medicine

## 2011-07-03 DIAGNOSIS — M25512 Pain in left shoulder: Secondary | ICD-10-CM

## 2011-08-05 LAB — URINALYSIS, ROUTINE W REFLEX MICROSCOPIC
Bilirubin Urine: NEGATIVE
Ketones, ur: NEGATIVE
Nitrite: NEGATIVE
Specific Gravity, Urine: 1.02
Urobilinogen, UA: 0.2
pH: 7

## 2011-08-05 LAB — COMPREHENSIVE METABOLIC PANEL
ALT: 16
ALT: 30
AST: 15
Albumin: 4.1
Alkaline Phosphatase: 59
Alkaline Phosphatase: 89
BUN: 22
CO2: 24
Calcium: 10.1
Chloride: 104
Creatinine, Ser: 1.47
GFR calc Af Amer: 60 — ABNORMAL LOW
GFR calc non Af Amer: 56 — ABNORMAL LOW
Glucose, Bld: 134 — ABNORMAL HIGH
Potassium: 4.7
Sodium: 140
Total Bilirubin: 0.5
Total Protein: 7.2

## 2011-08-05 LAB — CALCIUM
Calcium: 9
Calcium: 9.5

## 2011-08-05 LAB — DIFFERENTIAL
Basophils Absolute: 0
Basophils Relative: 0
Basophils Relative: 0
Eosinophils Absolute: 0.2
Eosinophils Absolute: 0.3
Monocytes Relative: 5
Neutro Abs: 8.1 — ABNORMAL HIGH
Neutrophils Relative %: 81 — ABNORMAL HIGH

## 2011-08-05 LAB — PROTIME-INR
Prothrombin Time: 14.2
Prothrombin Time: 14.8

## 2011-08-05 LAB — CBC
HCT: 41.4
HCT: 41.5
Hemoglobin: 14.2
Hemoglobin: 14.3
MCHC: 34.3
RBC: 4.63
RDW: 12.1
WBC: 8.8

## 2011-08-05 LAB — TROPONIN I: Troponin I: 0.02

## 2011-08-05 LAB — LIPID PANEL
LDL Cholesterol: 85
Total CHOL/HDL Ratio: 10.6
Triglycerides: 295 — ABNORMAL HIGH
VLDL: 59 — ABNORMAL HIGH

## 2011-08-05 LAB — POCT CARDIAC MARKERS
Myoglobin, poc: 149
Operator id: 1627

## 2011-08-05 LAB — MAGNESIUM
Magnesium: 2.1
Magnesium: 2.3

## 2011-08-05 LAB — TYPE AND SCREEN

## 2011-08-05 LAB — RAPID URINE DRUG SCREEN, HOSP PERFORMED
Cocaine: NOT DETECTED
Opiates: NOT DETECTED
Tetrahydrocannabinol: NOT DETECTED

## 2011-08-05 LAB — BASIC METABOLIC PANEL
BUN: 19
Chloride: 104
Glucose, Bld: 149 — ABNORMAL HIGH
Potassium: 4.2

## 2011-08-05 LAB — CK TOTAL AND CKMB (NOT AT ARMC)
CK, MB: 1.4
CK, MB: 1.4

## 2011-08-05 LAB — SEDIMENTATION RATE: Sed Rate: 52 — ABNORMAL HIGH

## 2011-08-05 LAB — HSV 2 ANTIBODY, IGG: HSV 2 Glycoprotein G Ab, IgG: 0.06

## 2011-08-05 LAB — ETHANOL: Alcohol, Ethyl (B): <5

## 2011-11-20 ENCOUNTER — Ambulatory Visit: Payer: BC Managed Care – PPO

## 2011-11-20 DIAGNOSIS — M25519 Pain in unspecified shoulder: Secondary | ICD-10-CM

## 2012-03-14 ENCOUNTER — Other Ambulatory Visit: Payer: Self-pay | Admitting: Dermatology

## 2012-03-14 DIAGNOSIS — C439 Malignant melanoma of skin, unspecified: Secondary | ICD-10-CM

## 2012-03-14 HISTORY — DX: Malignant melanoma of skin, unspecified: C43.9

## 2012-05-02 ENCOUNTER — Other Ambulatory Visit (HOSPITAL_COMMUNITY): Payer: Self-pay | Admitting: Urology

## 2012-05-02 ENCOUNTER — Ambulatory Visit (HOSPITAL_COMMUNITY)
Admission: RE | Admit: 2012-05-02 | Discharge: 2012-05-02 | Disposition: A | Discharge status: Home / Self Care | Payer: BC Managed Care – PPO | Source: Ambulatory Visit | Attending: Urology | Admitting: Urology

## 2012-05-02 DIAGNOSIS — C649 Malignant neoplasm of unspecified kidney, except renal pelvis: Secondary | ICD-10-CM | POA: Insufficient documentation

## 2012-05-02 DIAGNOSIS — Z85528 Personal history of other malignant neoplasm of kidney: Secondary | ICD-10-CM

## 2013-05-07 ENCOUNTER — Ambulatory Visit (HOSPITAL_COMMUNITY)
Admission: RE | Admit: 2013-05-07 | Discharge: 2013-05-07 | Disposition: A | Discharge status: Home / Self Care | Payer: BC Managed Care – PPO | Source: Ambulatory Visit | Attending: Urology | Admitting: Urology

## 2013-05-07 ENCOUNTER — Other Ambulatory Visit (HOSPITAL_COMMUNITY): Payer: Self-pay | Admitting: Urology

## 2013-05-07 DIAGNOSIS — Z125 Encounter for screening for malignant neoplasm of prostate: Secondary | ICD-10-CM

## 2013-05-07 DIAGNOSIS — Z8546 Personal history of malignant neoplasm of prostate: Secondary | ICD-10-CM | POA: Insufficient documentation

## 2013-05-07 DIAGNOSIS — Z122 Encounter for screening for malignant neoplasm of respiratory organs: Secondary | ICD-10-CM | POA: Insufficient documentation

## 2013-06-19 ENCOUNTER — Encounter: Payer: Self-pay | Admitting: Neurology

## 2013-06-19 DIAGNOSIS — G43909 Migraine, unspecified, not intractable, without status migrainosus: Secondary | ICD-10-CM | POA: Insufficient documentation

## 2013-06-19 DIAGNOSIS — D649 Anemia, unspecified: Secondary | ICD-10-CM | POA: Insufficient documentation

## 2013-06-19 DIAGNOSIS — N189 Chronic kidney disease, unspecified: Secondary | ICD-10-CM

## 2013-06-19 DIAGNOSIS — F329 Major depressive disorder, single episode, unspecified: Secondary | ICD-10-CM

## 2013-06-19 DIAGNOSIS — E785 Hyperlipidemia, unspecified: Secondary | ICD-10-CM

## 2013-06-19 DIAGNOSIS — G4733 Obstructive sleep apnea (adult) (pediatric): Secondary | ICD-10-CM | POA: Insufficient documentation

## 2013-06-19 DIAGNOSIS — N183 Chronic kidney disease, stage 3 unspecified: Secondary | ICD-10-CM | POA: Insufficient documentation

## 2013-06-19 DIAGNOSIS — N411 Chronic prostatitis: Secondary | ICD-10-CM

## 2013-06-19 DIAGNOSIS — G40209 Localization-related (focal) (partial) symptomatic epilepsy and epileptic syndromes with complex partial seizures, not intractable, without status epilepticus: Secondary | ICD-10-CM | POA: Insufficient documentation

## 2013-06-19 DIAGNOSIS — N179 Acute kidney failure, unspecified: Secondary | ICD-10-CM | POA: Insufficient documentation

## 2013-06-19 DIAGNOSIS — M75102 Unspecified rotator cuff tear or rupture of left shoulder, not specified as traumatic: Secondary | ICD-10-CM | POA: Insufficient documentation

## 2013-06-19 DIAGNOSIS — I10 Essential (primary) hypertension: Secondary | ICD-10-CM | POA: Insufficient documentation

## 2013-06-28 ENCOUNTER — Ambulatory Visit (INDEPENDENT_AMBULATORY_CARE_PROVIDER_SITE_OTHER): Payer: BC Managed Care – PPO | Admitting: Neurology

## 2013-06-28 ENCOUNTER — Encounter: Payer: Self-pay | Admitting: Neurology

## 2013-06-28 VITALS — BP 120/65 | HR 82 | Temp 97.6°F | Ht 71.0 in | Wt 194.0 lb

## 2013-06-28 DIAGNOSIS — R569 Unspecified convulsions: Secondary | ICD-10-CM | POA: Insufficient documentation

## 2013-06-28 DIAGNOSIS — G43909 Migraine, unspecified, not intractable, without status migrainosus: Secondary | ICD-10-CM

## 2013-06-28 MED ORDER — ZOLPIDEM TARTRATE 10 MG PO TABS: 10.0000 mg | ORAL_TABLET | Freq: Every evening | ORAL | Status: DC | PRN

## 2013-06-28 MED ORDER — LAMOTRIGINE 200 MG PO TABS: 200.0000 mg | ORAL_TABLET | Freq: Two times a day (BID) | ORAL | Status: DC

## 2013-06-28 NOTE — Progress Notes (Signed)
Reason for visit: Seizures  Kenneth Ortega is an 62 y.o. male  History of present illness:  Kenneth Ortega is a 62 year old right-handed white male with a history of seizures with partial complex features. The patient indicates that as far as he knows, he has not had any seizures. The patient indicates that if he is alone, he will not know if a seizure has occurred. The patient indicates that his wife is usually the one who will indicate that he has had an event. The patient was having migraine headaches when he was seen last in October 2013. The patient was placed on Topamax in low dose, and the headaches went away. The patient stopped the Topamax. The patient remains on Lamictal for the seizures, and he is tolerating the seizure medications well. The patient had a blood level done of the Lamictal on the last visit which was adequate. The patient returns for an evaluation. Since last seen, the patient has had a melanoma resected.  Past Medical History  Diagnosis Date  . Seizure   . Cancer     Skin, melanoma  . Prostatitis   . Renal cell carcinoma of left kidney   . Chronic renal insufficiency   . Memory disturbance   . Headache(784.0)     Migraine  . Hypertension   . Dyslipidemia   . Obstructive sleep apnea on CPAP   . Rotator cuff tear     Left    Past Surgical History  Procedure Laterality Date  . Skin cancer destruction    . Kidney surgery  2007    Removed  . Pilonidal cyst resection    . Arthroscopic surgery      Left knee  . Incisional hernia repair      Left flank    Family History  Problem Relation Age of Onset  . Cancer Mother   . Heart Problems Mother   . Throat cancer Father     Social history:  reports that he has never smoked. He has never used smokeless tobacco. He reports that he drinks about 0.6 ounces of alcohol per week. He reports that he does not use illicit drugs.    Allergies  Allergen Reactions  . Tylox [Oxycodone-Acetaminophen]   . Keppra  [Levetiracetam]     Medications:  Current Outpatient Prescriptions on File Prior to Visit  Medication Sig Dispense Refill  . amLODipine (NORVASC) 10 MG tablet Take 10 mg by mouth daily.      Boris Lown Oil 1000 MG CAPS Take 1,000 mg by mouth daily.      . tamsulosin (FLOMAX) 0.4 MG CAPS capsule Take 0.4 mg by mouth daily.       No current facility-administered medications on file prior to visit.    ROS:  Out of a complete 14 system review of symptoms, the patient complains only of the following symptoms, and all other reviewed systems are negative.  Fatigue Itching Allergies Mild memory disturbance History of seizures Sleep apnea Depression  Blood pressure 120/65, pulse 82, temperature 97.6 F (36.4 C), height 5\' 11"  (1.803 m), weight 194 lb (87.998 kg).  Physical Exam  General: The patient is alert and cooperative at the time of the examination.  Skin: No significant peripheral edema is noted.   Neurologic Exam  Cranial nerves: Facial symmetry is present. Speech is normal, no aphasia or dysarthria is noted. Extraocular movements are full. Visual fields are full.  Motor: The patient has good strength in all 4 extremities.  Coordination:  The patient has good finger-nose-finger and heel-to-shin bilaterally.  Gait and station: The patient has a normal gait. Tandem gait is normal. Romberg is negative. No drift is seen.  Reflexes: Deep tendon reflexes are symmetric.   Assessment/Plan:  1. History seizures  2. Mild memory disturbance  3. Migraine headache  The patient appears to be doing fairly well at this time. The patient will continue the Lamictal, and he will remain off of the Topamax. The patient will followup in one year. The patient will contact our office if new issues arise.  Marlan Palau MD 06/28/2013 7:17 PM  [facility] [address] [place], [place] [address]  Phone [phone] Fax [phone]

## 2013-08-30 ENCOUNTER — Other Ambulatory Visit: Payer: Self-pay

## 2013-10-02 ENCOUNTER — Encounter (HOSPITAL_COMMUNITY): Payer: Self-pay | Admitting: Emergency Medicine

## 2013-10-02 ENCOUNTER — Emergency Department (HOSPITAL_COMMUNITY)
Admission: EM | Admit: 2013-10-02 | Discharge: 2013-10-02 | Disposition: A | Discharge status: Home / Self Care | Payer: BC Managed Care – PPO | Attending: Emergency Medicine | Admitting: Emergency Medicine

## 2013-10-02 DIAGNOSIS — Z85528 Personal history of other malignant neoplasm of kidney: Secondary | ICD-10-CM | POA: Insufficient documentation

## 2013-10-02 DIAGNOSIS — I129 Hypertensive chronic kidney disease with stage 1 through stage 4 chronic kidney disease, or unspecified chronic kidney disease: Secondary | ICD-10-CM | POA: Insufficient documentation

## 2013-10-02 DIAGNOSIS — Z87448 Personal history of other diseases of urinary system: Secondary | ICD-10-CM | POA: Insufficient documentation

## 2013-10-02 DIAGNOSIS — Z87828 Personal history of other (healed) physical injury and trauma: Secondary | ICD-10-CM | POA: Insufficient documentation

## 2013-10-02 DIAGNOSIS — Z85820 Personal history of malignant melanoma of skin: Secondary | ICD-10-CM | POA: Insufficient documentation

## 2013-10-02 DIAGNOSIS — N189 Chronic kidney disease, unspecified: Secondary | ICD-10-CM | POA: Insufficient documentation

## 2013-10-02 DIAGNOSIS — B029 Zoster without complications: Secondary | ICD-10-CM | POA: Insufficient documentation

## 2013-10-02 DIAGNOSIS — G4733 Obstructive sleep apnea (adult) (pediatric): Secondary | ICD-10-CM | POA: Insufficient documentation

## 2013-10-02 DIAGNOSIS — IMO0002 Reserved for concepts with insufficient information to code with codable children: Secondary | ICD-10-CM | POA: Insufficient documentation

## 2013-10-02 DIAGNOSIS — G40909 Epilepsy, unspecified, not intractable, without status epilepticus: Secondary | ICD-10-CM | POA: Insufficient documentation

## 2013-10-02 DIAGNOSIS — Z8639 Personal history of other endocrine, nutritional and metabolic disease: Secondary | ICD-10-CM | POA: Insufficient documentation

## 2013-10-02 DIAGNOSIS — Z79899 Other long term (current) drug therapy: Secondary | ICD-10-CM | POA: Insufficient documentation

## 2013-10-02 DIAGNOSIS — Z862 Personal history of diseases of the blood and blood-forming organs and certain disorders involving the immune mechanism: Secondary | ICD-10-CM | POA: Insufficient documentation

## 2013-10-02 MED ORDER — VALACYCLOVIR HCL 500 MG PO TABS
1000.0000 mg | ORAL_TABLET | Freq: Three times a day (TID) | ORAL | Status: DC
  Administered 2013-10-02: 1000 mg via ORAL
  Filled 2013-10-02 (×3): qty 2

## 2013-10-02 MED ORDER — IBUPROFEN 600 MG PO TABS: 600.0000 mg | ORAL_TABLET | Freq: Three times a day (TID) | ORAL | Status: DC | PRN

## 2013-10-02 MED ORDER — HYDROCODONE-ACETAMINOPHEN 5-325 MG PO TABS: 1.0000 | ORAL_TABLET | Freq: Four times a day (QID) | ORAL | Status: DC | PRN

## 2013-10-02 MED ORDER — VALACYCLOVIR HCL 500 MG PO TABS: 500.0000 mg | ORAL_TABLET | Freq: Three times a day (TID) | ORAL | Status: DC

## 2013-10-02 MED ORDER — VALACYCLOVIR HCL 1 G PO TABS: 1000.0000 mg | ORAL_TABLET | Freq: Three times a day (TID) | ORAL | Status: DC

## 2013-10-02 NOTE — ED Notes (Signed)
Pt c/o pain left sided upper abd pain radiating to left flank area; tender to touch; pain x 3 days; no other symptoms

## 2013-10-02 NOTE — ED Provider Notes (Signed)
I saw and evaluated the patient, reviewed the resident's note and I agree with the findings and plan.  EKG Interpretation   None       Patient's pain is in a dermatomal location on the left side of his abdomen.  I suspect this is shingles prior to rash.  Doubt intra-abdominal pathology.  Patient be treated as if this is herpes zoster outbreak.  Patient understands return to the ER for new or worsening symptoms  Lyanne Co, MD 10/02/13 1635

## 2013-10-02 NOTE — ED Provider Notes (Signed)
CSN: [id number]     Arrival date & time 10/02/13  1030 History   First MD Initiated Contact with Patient 10/02/13 1039     Chief Complaint  Patient presents with  . Abdominal Pain   (Consider location/radiation/quality/duration/timing/severity/associated sxs/prior Treatment) HPI Comments: Kenneth Ortega is a 62 year old male with a PMH of RCC (L nephrectomy in 2007), epilepsy, melanoma and HTN presenting with a three day history of "sensitive skin" of left side of abdomen and mid back.  He feels 4/10 discomfort that he says is worse when clothing brushes up against the skin and when he stands up to walk.  He denies rash, trauma, recent strenuous exercise, N/V, diarrhea, constipation, change in appetite, symptoms of reflux or CP/XOB.  He did have chicken pox as a child and he had a shingles shot about a year ago.   Past Medical History  Diagnosis Date  . Seizure   . Cancer     Skin, melanoma  . Prostatitis   . Renal cell carcinoma of left kidney   . Chronic renal insufficiency   . Memory disturbance   . Headache(784.0)     Migraine  . Hypertension   . Dyslipidemia   . Obstructive sleep apnea on CPAP   . Rotator cuff tear     Left   Past Surgical History  Procedure Laterality Date  . Skin cancer destruction    . Kidney surgery  2007    Removed  . Pilonidal cyst resection    . Arthroscopic surgery      Left knee  . Incisional hernia repair      Left flank   Family History  Problem Relation Age of Onset  . Cancer Mother   . Heart Problems Mother   . Throat cancer Father    History  Substance Use Topics  . Smoking status: Never Smoker   . Smokeless tobacco: Never Used  . Alcohol Use: 0.6 oz/week    1 Cans of beer per week     Comment: OCC    Review of Systems  Constitutional: Negative for fever, activity change and appetite change.  HENT: Negative for congestion, rhinorrhea, sneezing and sore throat.   Respiratory: Negative for shortness of breath.   Cardiovascular:  Negative for chest pain and leg swelling.  Gastrointestinal: Positive for abdominal pain. Negative for nausea, vomiting, diarrhea, constipation and blood in stool.       Patient describes "sensitive skin" on left side of abdomen.  Genitourinary: Negative for dysuria and hematuria.  Musculoskeletal: Negative for gait problem.  Skin: Negative for rash.  Neurological: Negative for headaches.  Hematological: Does not bruise/bleed easily.    Allergies  Tylox and Keppra  Home Medications   Current Outpatient Rx  Name  Route  Sig  Dispense  Refill  . BENICAR 20 MG tablet   Oral   Take 20 mg by mouth daily.         Marland Kitchen FLUoxetine (PROZAC) 20 MG capsule   Oral   Take 20 mg by mouth daily.         Kenneth Ortega 1000 MG CAPS   Oral   Take 1,000 mg by mouth daily.         Marland Kitchen lamoTRIgine (LAMICTAL) 200 MG tablet   Oral   Take 1 tablet (200 mg total) by mouth 2 (two) times daily.   180 tablet   3   . mometasone (NASONEX) 50 MCG/ACT nasal spray   Nasal   Place  2 sprays into the nose daily.         . tamsulosin (FLOMAX) 0.4 MG CAPS capsule   Oral   Take 0.4 mg by mouth daily.         Marland Kitchen zolpidem (AMBIEN) 10 MG tablet   Oral   Take 1 tablet (10 mg total) by mouth at bedtime as needed for sleep.   90 tablet   0    BP 125/72  Pulse 64  Temp(Src) 98 F (36.7 C) (Oral)  Resp 14  SpO2 99% Physical Exam  Constitutional: He is oriented to person, place, and time. He appears well-developed and well-nourished. No distress.  HENT:  Head: Normocephalic and atraumatic.  Mouth/Throat: Oropharynx is clear and moist. No oropharyngeal exudate.  Eyes: Conjunctivae and EOM are normal. Pupils are equal, round, and reactive to light. Right eye exhibits no discharge. Left eye exhibits no discharge. No scleral icterus.  Neck: Neck supple.  Cardiovascular: Normal rate, regular rhythm and normal heart sounds.   Pulmonary/Chest: Effort normal and breath sounds normal. No respiratory  distress. He has no wheezes. He has no rales.  Abdominal: Soft. Bowel sounds are normal. He exhibits no distension. There is no guarding.  Skin of LUQ tender to light palpation (tenderness does not cross midline). No rashes, vesicles or lesions apparent.  LLQ, RUQ and RLQ non-tender.  Musculoskeletal: Normal range of motion. He exhibits no edema and no tenderness.  Back from mid thoracic vertebrae to left flank tender to light palpation.  The area of discomfort follows a mid thoracic dermatomal pattern, does not cross midline.  Spine non-tender.  No rashes, vesicles or lesions apparent.  Patient has full ROM.   Neurological: He is alert and oriented to person, place, and time.  Skin: Skin is warm. He is not diaphoretic.  Psychiatric: He has a normal mood and affect. His behavior is normal.    ED Course  Procedures (including critical care time) Labs Review Labs Reviewed - No data to display Imaging Review No results found.  EKG Interpretation   None       MDM  62 year old with PMH of epilepsy, HTN, RCC (L nephrectomy in 2007) and melanoma presenting with three day history of sensitive skin of the L back and abdomen.  Unremarkable abdominal exam without concern for acute abdomen; no N/V or change in bowel or bladder habits, no fever.  Pain is in a dermatomal pattern consistent with shingles.  Will give dose of Valtrex in ED and d/c home with 1 week of Valtrex 1g TID, ibuprofen and Vicodin prn.   Patient informed that a rash will likely appear within the next day or so.  He was instructed to take Valtrex for 1 week to help reduce the chance of post-herpetic neuralgia.  He was instructed to return to the ED with new or worsening symptoms.  Evelena Peat, DO 10/02/13 1238

## 2013-11-02 ENCOUNTER — Other Ambulatory Visit: Payer: Self-pay

## 2013-11-02 MED ORDER — ZOLPIDEM TARTRATE 10 MG PO TABS: 10.0000 mg | ORAL_TABLET | Freq: Every evening | ORAL | Status: DC | PRN

## 2013-11-29 DIAGNOSIS — M6281 Muscle weakness (generalized): Secondary | ICD-10-CM | POA: Diagnosis not present

## 2013-11-29 DIAGNOSIS — M75120 Complete rotator cuff tear or rupture of unspecified shoulder, not specified as traumatic: Secondary | ICD-10-CM | POA: Diagnosis not present

## 2013-12-03 DIAGNOSIS — N182 Chronic kidney disease, stage 2 (mild): Secondary | ICD-10-CM | POA: Diagnosis not present

## 2013-12-03 DIAGNOSIS — Z85528 Personal history of other malignant neoplasm of kidney: Secondary | ICD-10-CM | POA: Diagnosis not present

## 2013-12-03 DIAGNOSIS — I10 Essential (primary) hypertension: Secondary | ICD-10-CM | POA: Diagnosis not present

## 2013-12-03 DIAGNOSIS — E785 Hyperlipidemia, unspecified: Secondary | ICD-10-CM | POA: Diagnosis not present

## 2013-12-05 DIAGNOSIS — M75120 Complete rotator cuff tear or rupture of unspecified shoulder, not specified as traumatic: Secondary | ICD-10-CM | POA: Diagnosis not present

## 2013-12-05 DIAGNOSIS — M6281 Muscle weakness (generalized): Secondary | ICD-10-CM | POA: Diagnosis not present

## 2013-12-07 DIAGNOSIS — M75120 Complete rotator cuff tear or rupture of unspecified shoulder, not specified as traumatic: Secondary | ICD-10-CM | POA: Diagnosis not present

## 2013-12-07 DIAGNOSIS — M6281 Muscle weakness (generalized): Secondary | ICD-10-CM | POA: Diagnosis not present

## 2013-12-10 DIAGNOSIS — M6281 Muscle weakness (generalized): Secondary | ICD-10-CM | POA: Diagnosis not present

## 2013-12-10 DIAGNOSIS — M75120 Complete rotator cuff tear or rupture of unspecified shoulder, not specified as traumatic: Secondary | ICD-10-CM | POA: Diagnosis not present

## 2013-12-14 DIAGNOSIS — M75120 Complete rotator cuff tear or rupture of unspecified shoulder, not specified as traumatic: Secondary | ICD-10-CM | POA: Diagnosis not present

## 2013-12-14 DIAGNOSIS — M6281 Muscle weakness (generalized): Secondary | ICD-10-CM | POA: Diagnosis not present

## 2013-12-17 DIAGNOSIS — M6281 Muscle weakness (generalized): Secondary | ICD-10-CM | POA: Diagnosis not present

## 2013-12-17 DIAGNOSIS — M75120 Complete rotator cuff tear or rupture of unspecified shoulder, not specified as traumatic: Secondary | ICD-10-CM | POA: Diagnosis not present

## 2013-12-24 DIAGNOSIS — M75120 Complete rotator cuff tear or rupture of unspecified shoulder, not specified as traumatic: Secondary | ICD-10-CM | POA: Diagnosis not present

## 2013-12-24 DIAGNOSIS — M6281 Muscle weakness (generalized): Secondary | ICD-10-CM | POA: Diagnosis not present

## 2013-12-28 DIAGNOSIS — M75120 Complete rotator cuff tear or rupture of unspecified shoulder, not specified as traumatic: Secondary | ICD-10-CM | POA: Diagnosis not present

## 2013-12-28 DIAGNOSIS — M6281 Muscle weakness (generalized): Secondary | ICD-10-CM | POA: Diagnosis not present

## 2013-12-31 DIAGNOSIS — M75120 Complete rotator cuff tear or rupture of unspecified shoulder, not specified as traumatic: Secondary | ICD-10-CM | POA: Diagnosis not present

## 2013-12-31 DIAGNOSIS — M6281 Muscle weakness (generalized): Secondary | ICD-10-CM | POA: Diagnosis not present

## 2014-01-02 DIAGNOSIS — IMO0002 Reserved for concepts with insufficient information to code with codable children: Secondary | ICD-10-CM | POA: Diagnosis not present

## 2014-01-04 DIAGNOSIS — M6281 Muscle weakness (generalized): Secondary | ICD-10-CM | POA: Diagnosis not present

## 2014-01-04 DIAGNOSIS — M75120 Complete rotator cuff tear or rupture of unspecified shoulder, not specified as traumatic: Secondary | ICD-10-CM | POA: Diagnosis not present

## 2014-01-08 DIAGNOSIS — M75120 Complete rotator cuff tear or rupture of unspecified shoulder, not specified as traumatic: Secondary | ICD-10-CM | POA: Diagnosis not present

## 2014-01-08 DIAGNOSIS — M6281 Muscle weakness (generalized): Secondary | ICD-10-CM | POA: Diagnosis not present

## 2014-01-11 DIAGNOSIS — M75120 Complete rotator cuff tear or rupture of unspecified shoulder, not specified as traumatic: Secondary | ICD-10-CM | POA: Diagnosis not present

## 2014-01-11 DIAGNOSIS — M6281 Muscle weakness (generalized): Secondary | ICD-10-CM | POA: Diagnosis not present

## 2014-01-14 DIAGNOSIS — M75120 Complete rotator cuff tear or rupture of unspecified shoulder, not specified as traumatic: Secondary | ICD-10-CM | POA: Diagnosis not present

## 2014-01-14 DIAGNOSIS — M6281 Muscle weakness (generalized): Secondary | ICD-10-CM | POA: Diagnosis not present

## 2014-01-18 DIAGNOSIS — M75120 Complete rotator cuff tear or rupture of unspecified shoulder, not specified as traumatic: Secondary | ICD-10-CM | POA: Diagnosis not present

## 2014-01-18 DIAGNOSIS — M6281 Muscle weakness (generalized): Secondary | ICD-10-CM | POA: Diagnosis not present

## 2014-01-21 DIAGNOSIS — M75120 Complete rotator cuff tear or rupture of unspecified shoulder, not specified as traumatic: Secondary | ICD-10-CM | POA: Diagnosis not present

## 2014-01-21 DIAGNOSIS — M6281 Muscle weakness (generalized): Secondary | ICD-10-CM | POA: Diagnosis not present

## 2014-01-23 DIAGNOSIS — M75120 Complete rotator cuff tear or rupture of unspecified shoulder, not specified as traumatic: Secondary | ICD-10-CM | POA: Diagnosis not present

## 2014-01-23 DIAGNOSIS — M6281 Muscle weakness (generalized): Secondary | ICD-10-CM | POA: Diagnosis not present

## 2014-05-17 ENCOUNTER — Other Ambulatory Visit (HOSPITAL_COMMUNITY): Payer: Self-pay | Admitting: Urology

## 2014-05-17 ENCOUNTER — Ambulatory Visit (HOSPITAL_COMMUNITY)
Admission: RE | Admit: 2014-05-17 | Discharge: 2014-05-17 | Disposition: A | Discharge status: Home / Self Care | Payer: Medicare Other | Source: Ambulatory Visit | Attending: Urology | Admitting: Urology

## 2014-05-17 DIAGNOSIS — Z85528 Personal history of other malignant neoplasm of kidney: Secondary | ICD-10-CM

## 2014-05-17 DIAGNOSIS — C649 Malignant neoplasm of unspecified kidney, except renal pelvis: Secondary | ICD-10-CM | POA: Diagnosis not present

## 2014-05-17 DIAGNOSIS — Z905 Acquired absence of kidney: Secondary | ICD-10-CM | POA: Diagnosis not present

## 2014-05-17 DIAGNOSIS — N139 Obstructive and reflux uropathy, unspecified: Secondary | ICD-10-CM | POA: Diagnosis not present

## 2014-05-17 DIAGNOSIS — Z125 Encounter for screening for malignant neoplasm of prostate: Secondary | ICD-10-CM | POA: Diagnosis not present

## 2014-05-17 DIAGNOSIS — N138 Other obstructive and reflux uropathy: Secondary | ICD-10-CM | POA: Diagnosis not present

## 2014-05-17 DIAGNOSIS — N401 Enlarged prostate with lower urinary tract symptoms: Secondary | ICD-10-CM | POA: Diagnosis not present

## 2014-05-24 DIAGNOSIS — N401 Enlarged prostate with lower urinary tract symptoms: Secondary | ICD-10-CM | POA: Diagnosis not present

## 2014-05-24 DIAGNOSIS — N138 Other obstructive and reflux uropathy: Secondary | ICD-10-CM | POA: Diagnosis not present

## 2014-05-24 DIAGNOSIS — Z85528 Personal history of other malignant neoplasm of kidney: Secondary | ICD-10-CM | POA: Diagnosis not present

## 2014-06-17 DIAGNOSIS — H04129 Dry eye syndrome of unspecified lacrimal gland: Secondary | ICD-10-CM | POA: Diagnosis not present

## 2014-06-17 DIAGNOSIS — H2510 Age-related nuclear cataract, unspecified eye: Secondary | ICD-10-CM | POA: Diagnosis not present

## 2014-06-28 ENCOUNTER — Encounter: Payer: Self-pay | Admitting: Neurology

## 2014-06-28 ENCOUNTER — Ambulatory Visit (INDEPENDENT_AMBULATORY_CARE_PROVIDER_SITE_OTHER): Payer: Medicare Other | Admitting: Neurology

## 2014-06-28 VITALS — BP 110/80 | HR 68 | Wt 192.0 lb

## 2014-06-28 DIAGNOSIS — G43909 Migraine, unspecified, not intractable, without status migrainosus: Secondary | ICD-10-CM | POA: Diagnosis not present

## 2014-06-28 DIAGNOSIS — Z5181 Encounter for therapeutic drug level monitoring: Secondary | ICD-10-CM | POA: Diagnosis not present

## 2014-06-28 DIAGNOSIS — R569 Unspecified convulsions: Secondary | ICD-10-CM

## 2014-06-28 MED ORDER — LAMOTRIGINE 200 MG PO TABS: 200.0000 mg | ORAL_TABLET | Freq: Two times a day (BID) | ORAL | Status: DC

## 2014-06-28 NOTE — Patient Instructions (Signed)

## 2014-06-28 NOTE — Progress Notes (Signed)
Reason for visit: Seizures  Kenneth Ortega is an 63 y.o. male  History of present illness:  Kenneth Ortega is a 63 year old right-handed white male with a history of seizures and migraine headache. He has done well with both of these issues since last seen one year ago. The patient reports no recurrent seizures, and he remains on Lamictal, tolerating the medication well. The patient denies any migraine headaches over the last year. The patient sleeps well at night, and he is operating a motor vehicle. The patient denies any new medical issues that have come up since last seen.  Past Medical History  Diagnosis Date  . Seizure   . Cancer     Skin, melanoma  . Prostatitis   . Renal cell carcinoma of left kidney   . Chronic renal insufficiency   . Memory disturbance   . Headache(784.0)     Migraine  . Hypertension   . Dyslipidemia   . Obstructive sleep apnea on CPAP   . Rotator cuff tear     Left    Past Surgical History  Procedure Laterality Date  . Skin cancer destruction    . Kidney surgery  2007    Removed  . Pilonidal cyst resection    . Arthroscopic surgery      Left knee  . Incisional hernia repair      Left flank    Family History  Problem Relation Age of Onset  . Cancer Mother   . Heart Problems Mother   . Throat cancer Father     Social history:  reports that he has never smoked. He has never used smokeless tobacco. He reports that he drinks about .6 ounces of alcohol per week. He reports that he does not use illicit drugs.    Allergies  Allergen Reactions  . Tylox [Oxycodone-Acetaminophen]   . Keppra [Levetiracetam]     Patient unsure of reaction    Medications:  Current Outpatient Prescriptions on File Prior to Visit  Medication Sig Dispense Refill  . BENICAR 20 MG tablet Take 20 mg by mouth daily.      Marland Kitchen FLUoxetine (PROZAC) 20 MG capsule Take 20 mg by mouth daily.      Marland Kitchen HYDROcodone-acetaminophen (NORCO/VICODIN) 5-325 MG per tablet Take 1-2  tablets by mouth every 6 (six) hours as needed for moderate pain or severe pain.  30 tablet  0  . Krill Oil 1000 MG CAPS Take 1,000 mg by mouth daily.      . mometasone (NASONEX) 50 MCG/ACT nasal spray Place 2 sprays into the nose daily.      . tamsulosin (FLOMAX) 0.4 MG CAPS capsule Take 0.4 mg by mouth daily.      . valACYclovir (VALTREX) 1000 MG tablet Take 1 tablet (1,000 mg total) by mouth 3 (three) times daily.  20 tablet  0  . zolpidem (AMBIEN) 10 MG tablet Take 1 tablet (10 mg total) by mouth at bedtime as needed for sleep.  90 tablet  1   No current facility-administered medications on file prior to visit.    ROS:  Out of a complete 14 system review of symptoms, the patient complains only of the following symptoms, and all other reviewed systems are negative.  History of headache History of seizures  Blood pressure 110/80, pulse 68, weight 192 lb (87.091 kg).  Physical Exam  General: The patient is alert and cooperative at the time of the examination.  Skin: No significant peripheral edema is noted.  Neurologic Exam  Mental status: The patient is oriented x 3. Mini-Mental status examination done today shows a total score of 25/30.  Cranial nerves: Facial symmetry is present. Speech is normal, no aphasia or dysarthria is noted. Extraocular movements are full. Visual fields are full.  Motor: The patient has good strength in all 4 extremities.  Sensory examination: Soft touch sensation is symmetric on the face, arms, and legs.  Coordination: The patient has good finger-nose-finger and heel-to-shin bilaterally.  Gait and station: The patient has a normal gait. Tandem gait is normal. Romberg is negative. No drift is seen.  Reflexes: Deep tendon reflexes are symmetric.   Assessment/Plan:  1. History of seizures  2. History of migraine headache  3. Mild memory disturbance  The patient is doing well at this time. He will continue on the Lamictal, and he will  followup in one year. Blood work for a Lamictal level will be done.  Jill Alexanders MD 06/29/2014 9:43 AM  [facility] [address] [place], [place] [address]  Phone [phone] Fax [phone]

## 2014-07-03 LAB — LAMOTRIGINE LEVEL: LAMOTRIGINE LVL: 12.6 ug/mL (ref 2.0–20.0)

## 2014-07-08 DIAGNOSIS — D239 Other benign neoplasm of skin, unspecified: Secondary | ICD-10-CM | POA: Diagnosis not present

## 2014-07-08 DIAGNOSIS — L280 Lichen simplex chronicus: Secondary | ICD-10-CM | POA: Diagnosis not present

## 2014-08-03 DIAGNOSIS — Z23 Encounter for immunization: Secondary | ICD-10-CM | POA: Diagnosis not present

## 2014-09-12 DIAGNOSIS — F329 Major depressive disorder, single episode, unspecified: Secondary | ICD-10-CM | POA: Diagnosis not present

## 2014-09-13 DIAGNOSIS — I10 Essential (primary) hypertension: Secondary | ICD-10-CM | POA: Diagnosis not present

## 2014-09-13 DIAGNOSIS — Z125 Encounter for screening for malignant neoplasm of prostate: Secondary | ICD-10-CM | POA: Diagnosis not present

## 2014-09-13 DIAGNOSIS — E780 Pure hypercholesterolemia: Secondary | ICD-10-CM | POA: Diagnosis not present

## 2014-09-18 DIAGNOSIS — E780 Pure hypercholesterolemia: Secondary | ICD-10-CM | POA: Diagnosis not present

## 2014-09-18 DIAGNOSIS — I10 Essential (primary) hypertension: Secondary | ICD-10-CM | POA: Diagnosis not present

## 2014-09-18 DIAGNOSIS — Z23 Encounter for immunization: Secondary | ICD-10-CM | POA: Diagnosis not present

## 2014-09-18 DIAGNOSIS — F329 Major depressive disorder, single episode, unspecified: Secondary | ICD-10-CM | POA: Diagnosis not present

## 2014-09-18 DIAGNOSIS — R5383 Other fatigue: Secondary | ICD-10-CM | POA: Diagnosis not present

## 2015-01-01 DIAGNOSIS — K649 Unspecified hemorrhoids: Secondary | ICD-10-CM | POA: Diagnosis not present

## 2015-01-20 DIAGNOSIS — I10 Essential (primary) hypertension: Secondary | ICD-10-CM | POA: Diagnosis not present

## 2015-01-20 DIAGNOSIS — N182 Chronic kidney disease, stage 2 (mild): Secondary | ICD-10-CM | POA: Diagnosis not present

## 2015-01-20 DIAGNOSIS — E785 Hyperlipidemia, unspecified: Secondary | ICD-10-CM | POA: Diagnosis not present

## 2015-01-20 DIAGNOSIS — Z85528 Personal history of other malignant neoplasm of kidney: Secondary | ICD-10-CM | POA: Diagnosis not present

## 2015-01-22 DIAGNOSIS — K641 Second degree hemorrhoids: Secondary | ICD-10-CM | POA: Diagnosis not present

## 2015-01-22 DIAGNOSIS — K645 Perianal venous thrombosis: Secondary | ICD-10-CM | POA: Diagnosis not present

## 2015-01-24 DIAGNOSIS — N419 Inflammatory disease of prostate, unspecified: Secondary | ICD-10-CM | POA: Diagnosis not present

## 2015-01-24 DIAGNOSIS — R399 Unspecified symptoms and signs involving the genitourinary system: Secondary | ICD-10-CM | POA: Diagnosis not present

## 2015-02-05 DIAGNOSIS — Z2089 Contact with and (suspected) exposure to other communicable diseases: Secondary | ICD-10-CM | POA: Diagnosis not present

## 2015-02-05 DIAGNOSIS — K641 Second degree hemorrhoids: Secondary | ICD-10-CM | POA: Diagnosis not present

## 2015-02-19 DIAGNOSIS — K641 Second degree hemorrhoids: Secondary | ICD-10-CM | POA: Diagnosis not present

## 2015-03-06 ENCOUNTER — Encounter: Payer: Self-pay | Admitting: Nurse Practitioner

## 2015-03-06 ENCOUNTER — Ambulatory Visit (INDEPENDENT_AMBULATORY_CARE_PROVIDER_SITE_OTHER): Payer: Medicare Other | Admitting: Nurse Practitioner

## 2015-03-06 VITALS — BP 113/73 | HR 58 | Ht 70.5 in | Wt 190.8 lb

## 2015-03-06 DIAGNOSIS — I951 Orthostatic hypotension: Secondary | ICD-10-CM | POA: Insufficient documentation

## 2015-03-06 DIAGNOSIS — R569 Unspecified convulsions: Secondary | ICD-10-CM

## 2015-03-06 DIAGNOSIS — Z5181 Encounter for therapeutic drug level monitoring: Secondary | ICD-10-CM | POA: Diagnosis not present

## 2015-03-06 DIAGNOSIS — G43909 Migraine, unspecified, not intractable, without status migrainosus: Secondary | ICD-10-CM

## 2015-03-06 DIAGNOSIS — R42 Dizziness and giddiness: Secondary | ICD-10-CM | POA: Diagnosis not present

## 2015-03-06 MED ORDER — LAMOTRIGINE 200 MG PO TABS: 200.0000 mg | ORAL_TABLET | Freq: Two times a day (BID) | ORAL | Status: DC

## 2015-03-06 NOTE — Progress Notes (Signed)
I have read the note, and I agree with the clinical assessment and plan.  Chaneka Trefz KEITH   

## 2015-03-06 NOTE — Patient Instructions (Signed)
B/P lying 120/70 Seated 124/70 Standing 100/70 Please strength plenty fluids daily which will help with your symptoms of dizziness and unsteadiness Continue Lamictal at current dose will refill Check Lamictal level today Follow-up in 6-8 months

## 2015-03-06 NOTE — Progress Notes (Signed)
[facility]  PATIENT: Kenneth Ortega DOB: 16-Sep-1951   REASON FOR VISIT: follow up for seizure disorder, new complaint of dizziness  HISTORY FROM:patient and wife    HISTORY OF PRESENT ILLNESS:Kenneth Ortega is a 64 year old right-handed white male with a history of seizures and migraine headache. He was last seen by Kenneth Ortega 06/28/2014. He has done well with both of these issues since last seen one year ago. The patient reports no recurrent seizures, and he remains on Lamictal, tolerating the medication well.  Last seizure according to the patient 2007 .The patient denies any migraine headaches over the last year.He has a new complaint today of dizziness with changing positions especially when arising from the bed  . He only has one kidney and drinks little water during the day The patient sleeps well at night, and he is operating a motor vehicle. The patient denies any new medical issues that have come up since last seen.    REVIEW OF SYSTEMS: Full 14 system review of systems performed and notable only for those listed, all others are neg:  Constitutional: neg  Cardiovascular: neg Ear/Nose/Throat: neg  Skin: Itching Eyes: neg Respiratory: neg Gastroitestinal: neg  Hematology/Lymphatic: neg  Endocrine: neg Musculoskeletal: Back pain neck pain Stiffness Allergy/Immunology: neg Neurological: History of migraine, seizure disorder, dizziness Psychiatric: Depression Sleep : neg   ALLERGIES: Allergies  Allergen Reactions  . Tylox [Oxycodone-Acetaminophen]   . Keppra [Levetiracetam]     Patient unsure of reaction    HOME MEDICATIONS: Outpatient Prescriptions Prior to Visit  Medication Sig Dispense Refill  . BENICAR 20 MG tablet Take 20 mg by mouth daily.    Marland Kitchen FLUoxetine (PROZAC) 20 MG capsule Take 20 mg by mouth daily.    . mometasone (NASONEX) 50 MCG/ACT nasal spray Place 2 sprays into the nose daily.    . tamsulosin (FLOMAX) 0.4 MG CAPS capsule Take  0.4 mg by mouth daily.    Marland Kitchen zolpidem (AMBIEN) 10 MG tablet Take 1 tablet (10 mg total) by mouth at bedtime as needed for sleep. 90 tablet 1  . lamoTRIgine (LAMICTAL) 200 MG tablet Take 1 tablet (200 mg total) by mouth 2 (two) times daily. 180 tablet 3  . Krill Oil 1000 MG CAPS Take 1,000 mg by mouth daily.    . valACYclovir (VALTREX) 1000 MG tablet Take 1 tablet (1,000 mg total) by mouth 3 (three) times daily. (Patient not taking: Reported on 03/06/2015) 20 tablet 0  . HYDROcodone-acetaminophen (NORCO/VICODIN) 5-325 MG per tablet Take 1-2 tablets by mouth every 6 (six) hours as needed for moderate pain or severe pain. (Patient not taking: Reported on 03/06/2015) 30 tablet 0   No facility-administered medications prior to visit.    PAST MEDICAL HISTORY: Past Medical History  Diagnosis Date  . Seizure   . Cancer     Skin, melanoma  . Prostatitis   . Renal cell carcinoma of left kidney   . Chronic renal insufficiency   . Memory disturbance   . Headache(784.0)     Migraine  . Hypertension   . Dyslipidemia   . Obstructive sleep apnea on CPAP   . Rotator cuff tear     Left    PAST SURGICAL HISTORY: Past Surgical History  Procedure Laterality Date  . Skin cancer destruction    . Kidney surgery  2007    Removed  . Pilonidal cyst resection    . Arthroscopic surgery      Left knee  . Incisional hernia repair  Left flank    FAMILY HISTORY: Family History  Problem Relation Age of Onset  . Cancer Mother   . Heart Problems Mother   . Throat cancer Father   . Diabetes Sister     SOCIAL HISTORY: History   Social History  . Marital Status: Married    Spouse Name: Olin Hauser  . Number of Children: 0  . Years of Education: HS   Occupational History  . 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 Smoking status: Never Smoker   . Smokeless tobacco: Never Used  . Alcohol Use: 0.6 oz/week    1 Cans of beer per week     Comment: OCC  . Drug Use: No  .  Sexual Activity: Not on file   Other Topics Concern  . Not on file   Social History Narrative   Patient lives at home with his wife Olin Hauser). Patient is retired. High school education.   Right handed.   Caffeine- one or two cups daily     PHYSICAL EXAM  Filed Vitals:   03/06/15 1008  BP: lying 120/70, seated 124/70/ standing 100/70  Pulse: 58  Height: 5' 10.5" (1.791 m)  Weight: 190 lb 12.8 oz (86.546 kg)   Body mass index is 26.98 kg/(m^2). General: The patient is alert and cooperative at the time of the examination. Skin: No significant peripheral edema is noted.  Neurologic Exam Mental status: The patient is oriented x 3.  Cranial nerves: Facial symmetry is present. Speech is normal, no aphasia or dysarthria is noted. Extraocular movements are full. Visual fields are full. Motor: The patient has good strength in all 4 extremities. No focal weakness Sensory examination: Soft touch sensation is symmetric on the face, arms, and legs. Coordination: The patient has good finger-nose-finger and heel-to-shin bilaterally. Gait and station: The patient has a normal gait. Tandem gait is normal. Romberg is negative. No drift is seen. Reflexes: Deep tendon reflexes are symmetric.    DIAGNOSTIC DATA (LABS, IMAGING, TESTING) -  ASSESSMENT AND PLAN  64 y.o. year old male  has a past medical history of Seizure; Renal cell carcinoma of left kidney; Chronic renal insufficiency; Memory disturbance; Headache(784.0); Hypertension; Dyslipidemia; Obstructive sleep apnea on CPAP; and orthostatic hypotension here to follow-up  B/P lying 120/70, Seated 124/70 Standing 100/70 Please drink plenty fluids daily which will help with your symptoms of dizziness and unsteadiness Sit on the side of bed for 5 minutes prior to getting up to get your  bearings Continue Lamictal at current dose will refill Check Lamictal level today Follow-up in 6-8 months Dennie Bible, [facility], [facility], [facility] [address], [place], [place] [address] [phone]

## 2015-03-07 LAB — LAMOTRIGINE LEVEL: LAMOTRIGINE LVL: 1.2 ug/mL — AB (ref 2.0–20.0)

## 2015-03-11 ENCOUNTER — Telehealth: Payer: Self-pay | Admitting: Neurology

## 2015-03-11 DIAGNOSIS — Z5181 Encounter for therapeutic drug level monitoring: Secondary | ICD-10-CM

## 2015-03-11 MED ORDER — LAMOTRIGINE 200 MG PO TABS: 200.0000 mg | ORAL_TABLET | Freq: Two times a day (BID) | ORAL | Status: DC

## 2015-03-11 MED ORDER — LAMOTRIGINE 25 MG PO TABS: 25.0000 mg | ORAL_TABLET | Freq: Every day | ORAL | Status: DC

## 2015-03-11 MED ORDER — LAMOTRIGINE 25 MG PO TABS: ORAL_TABLET | ORAL | Status: DC

## 2015-03-11 NOTE — Telephone Encounter (Signed)
I called back.  They asked if we can send Rx's to them instead of [facility] because the patient wants to pick up Rx's today rather than waiting on mail order.  Rx's have been resent.  Pharmacy confirmed receipt.

## 2015-03-11 NOTE — Telephone Encounter (Signed)
Mechele Claude from [facility] in [place] called and needs some clarification on the Rx.  lamoTRIgine (LAMICTAL) 25 MG tablet and Rx. lamoTRIgine (LAMICTAL) 200 MG tablet. Please call and advise.

## 2015-03-11 NOTE — Telephone Encounter (Signed)
Lamictal level 1.2 trough. Please add 50mg  to night dose. Repeat level in 2 weeks Patient verbalizes understanding

## 2015-03-11 NOTE — Telephone Encounter (Signed)
Patient is returning Kenneth Ortega's call in regard his blood work last week. Please call him at his home # but if you cannot reach him call on his cell [phone].  Thanks!

## 2015-03-14 DIAGNOSIS — I10 Essential (primary) hypertension: Secondary | ICD-10-CM | POA: Diagnosis not present

## 2015-03-14 DIAGNOSIS — F329 Major depressive disorder, single episode, unspecified: Secondary | ICD-10-CM | POA: Diagnosis not present

## 2015-03-14 DIAGNOSIS — R5383 Other fatigue: Secondary | ICD-10-CM | POA: Diagnosis not present

## 2015-03-19 DIAGNOSIS — E780 Pure hypercholesterolemia: Secondary | ICD-10-CM | POA: Diagnosis not present

## 2015-03-19 DIAGNOSIS — I10 Essential (primary) hypertension: Secondary | ICD-10-CM | POA: Diagnosis not present

## 2015-03-19 DIAGNOSIS — E291 Testicular hypofunction: Secondary | ICD-10-CM | POA: Diagnosis not present

## 2015-03-19 DIAGNOSIS — F329 Major depressive disorder, single episode, unspecified: Secondary | ICD-10-CM | POA: Diagnosis not present

## 2015-03-25 ENCOUNTER — Other Ambulatory Visit (INDEPENDENT_AMBULATORY_CARE_PROVIDER_SITE_OTHER): Payer: Self-pay

## 2015-03-25 DIAGNOSIS — Z0289 Encounter for other administrative examinations: Secondary | ICD-10-CM

## 2015-03-25 DIAGNOSIS — Z5181 Encounter for therapeutic drug level monitoring: Secondary | ICD-10-CM

## 2015-03-26 LAB — LAMOTRIGINE LEVEL: Lamotrigine Lvl: 11.9 ug/mL (ref 2.0–20.0)

## 2015-03-27 ENCOUNTER — Telehealth: Payer: Self-pay

## 2015-03-27 NOTE — Telephone Encounter (Signed)
Patient wife was informed that husbands Lamictal level was good.  She has no questions at this time.

## 2015-03-31 DIAGNOSIS — Z86010 Personal history of colonic polyps: Secondary | ICD-10-CM | POA: Diagnosis not present

## 2015-03-31 DIAGNOSIS — D123 Benign neoplasm of transverse colon: Secondary | ICD-10-CM | POA: Diagnosis not present

## 2015-03-31 DIAGNOSIS — Z1211 Encounter for screening for malignant neoplasm of colon: Secondary | ICD-10-CM | POA: Diagnosis not present

## 2015-03-31 DIAGNOSIS — D122 Benign neoplasm of ascending colon: Secondary | ICD-10-CM | POA: Diagnosis not present

## 2015-03-31 DIAGNOSIS — D126 Benign neoplasm of colon, unspecified: Secondary | ICD-10-CM | POA: Diagnosis not present

## 2015-03-31 DIAGNOSIS — D120 Benign neoplasm of cecum: Secondary | ICD-10-CM | POA: Diagnosis not present

## 2015-03-31 DIAGNOSIS — K5730 Diverticulosis of large intestine without perforation or abscess without bleeding: Secondary | ICD-10-CM | POA: Diagnosis not present

## 2015-04-01 DIAGNOSIS — R238 Other skin changes: Secondary | ICD-10-CM | POA: Diagnosis not present

## 2015-04-01 DIAGNOSIS — W57XXXA Bitten or stung by nonvenomous insect and other nonvenomous arthropods, initial encounter: Secondary | ICD-10-CM | POA: Diagnosis not present

## 2015-04-07 ENCOUNTER — Telehealth: Payer: Self-pay | Admitting: Neurology

## 2015-04-07 MED ORDER — LAMOTRIGINE 25 MG PO TABS: ORAL_TABLET | ORAL | Status: DC

## 2015-04-07 NOTE — Telephone Encounter (Signed)
Rx has been sent.  Receipt confirmed by pharmacy.   

## 2015-04-07 NOTE — Telephone Encounter (Signed)
I called pt and confirmed dose lamictal 200mg  po am and lamictal 250mg  po pm.  He would like to get the 25mg  tabs (2 tabs ) [facility] ([facility] days supply and 3 refills).   More cost effective.

## 2015-04-07 NOTE — Telephone Encounter (Signed)
Patient is calling in regard to Lamictal 25 1 x day and 2 at night. According to results of bloodwork please advise patient of dosage he needs to take of the Lamictal.  He need his Rx Lamictal 25 called to [facility] for 90 day supply.  Thanks!

## 2015-04-21 ENCOUNTER — Other Ambulatory Visit: Payer: Self-pay

## 2015-05-19 ENCOUNTER — Other Ambulatory Visit (HOSPITAL_COMMUNITY): Payer: Self-pay | Admitting: Urology

## 2015-05-19 ENCOUNTER — Ambulatory Visit (HOSPITAL_COMMUNITY)
Admission: RE | Admit: 2015-05-19 | Discharge: 2015-05-19 | Disposition: A | Discharge status: Home / Self Care | Payer: Medicare Other | Source: Ambulatory Visit | Attending: Urology | Admitting: Urology

## 2015-05-19 DIAGNOSIS — Z85528 Personal history of other malignant neoplasm of kidney: Secondary | ICD-10-CM | POA: Insufficient documentation

## 2015-05-19 DIAGNOSIS — Z125 Encounter for screening for malignant neoplasm of prostate: Secondary | ICD-10-CM | POA: Diagnosis not present

## 2015-05-19 DIAGNOSIS — N401 Enlarged prostate with lower urinary tract symptoms: Secondary | ICD-10-CM | POA: Diagnosis not present

## 2015-05-28 DIAGNOSIS — Z85528 Personal history of other malignant neoplasm of kidney: Secondary | ICD-10-CM | POA: Diagnosis not present

## 2015-05-28 DIAGNOSIS — Z125 Encounter for screening for malignant neoplasm of prostate: Secondary | ICD-10-CM | POA: Diagnosis not present

## 2015-05-28 DIAGNOSIS — N183 Chronic kidney disease, stage 3 (moderate): Secondary | ICD-10-CM | POA: Diagnosis not present

## 2015-05-28 DIAGNOSIS — N401 Enlarged prostate with lower urinary tract symptoms: Secondary | ICD-10-CM | POA: Diagnosis not present

## 2015-05-28 DIAGNOSIS — N138 Other obstructive and reflux uropathy: Secondary | ICD-10-CM | POA: Diagnosis not present

## 2015-06-12 DIAGNOSIS — F329 Major depressive disorder, single episode, unspecified: Secondary | ICD-10-CM | POA: Diagnosis not present

## 2015-06-12 DIAGNOSIS — I10 Essential (primary) hypertension: Secondary | ICD-10-CM | POA: Diagnosis not present

## 2015-06-12 DIAGNOSIS — E780 Pure hypercholesterolemia: Secondary | ICD-10-CM | POA: Diagnosis not present

## 2015-06-12 DIAGNOSIS — E291 Testicular hypofunction: Secondary | ICD-10-CM | POA: Diagnosis not present

## 2015-06-18 DIAGNOSIS — E0590 Thyrotoxicosis, unspecified without thyrotoxic crisis or storm: Secondary | ICD-10-CM | POA: Diagnosis not present

## 2015-06-18 DIAGNOSIS — R42 Dizziness and giddiness: Secondary | ICD-10-CM | POA: Diagnosis not present

## 2015-06-20 ENCOUNTER — Other Ambulatory Visit: Payer: Self-pay | Admitting: Internal Medicine

## 2015-06-20 DIAGNOSIS — R42 Dizziness and giddiness: Secondary | ICD-10-CM

## 2015-06-24 ENCOUNTER — Ambulatory Visit
Admission: RE | Admit: 2015-06-24 | Discharge: 2015-06-24 | Disposition: A | Discharge status: Home / Self Care | Payer: Medicare Other | Source: Ambulatory Visit | Attending: Internal Medicine | Admitting: Internal Medicine

## 2015-06-24 DIAGNOSIS — R42 Dizziness and giddiness: Secondary | ICD-10-CM | POA: Diagnosis not present

## 2015-06-24 DIAGNOSIS — Z6827 Body mass index (BMI) 27.0-27.9, adult: Secondary | ICD-10-CM | POA: Diagnosis not present

## 2015-06-24 DIAGNOSIS — N183 Chronic kidney disease, stage 3 (moderate): Secondary | ICD-10-CM | POA: Diagnosis not present

## 2015-06-24 DIAGNOSIS — R569 Unspecified convulsions: Secondary | ICD-10-CM | POA: Diagnosis not present

## 2015-06-24 DIAGNOSIS — I10 Essential (primary) hypertension: Secondary | ICD-10-CM | POA: Diagnosis not present

## 2015-06-26 DIAGNOSIS — R42 Dizziness and giddiness: Secondary | ICD-10-CM | POA: Diagnosis not present

## 2015-06-26 DIAGNOSIS — I10 Essential (primary) hypertension: Secondary | ICD-10-CM | POA: Diagnosis not present

## 2015-07-01 ENCOUNTER — Ambulatory Visit: Payer: BC Managed Care – PPO | Admitting: Adult Health

## 2015-07-02 DIAGNOSIS — H2513 Age-related nuclear cataract, bilateral: Secondary | ICD-10-CM | POA: Diagnosis not present

## 2015-07-16 DIAGNOSIS — E0590 Thyrotoxicosis, unspecified without thyrotoxic crisis or storm: Secondary | ICD-10-CM | POA: Diagnosis not present

## 2015-07-16 DIAGNOSIS — R739 Hyperglycemia, unspecified: Secondary | ICD-10-CM | POA: Diagnosis not present

## 2015-07-21 DIAGNOSIS — R739 Hyperglycemia, unspecified: Secondary | ICD-10-CM | POA: Diagnosis not present

## 2015-07-21 DIAGNOSIS — E780 Pure hypercholesterolemia: Secondary | ICD-10-CM | POA: Diagnosis not present

## 2015-07-21 DIAGNOSIS — F329 Major depressive disorder, single episode, unspecified: Secondary | ICD-10-CM | POA: Diagnosis not present

## 2015-07-21 DIAGNOSIS — I10 Essential (primary) hypertension: Secondary | ICD-10-CM | POA: Diagnosis not present

## 2015-07-22 ENCOUNTER — Other Ambulatory Visit: Payer: Self-pay | Admitting: Dermatology

## 2015-07-22 DIAGNOSIS — D239 Other benign neoplasm of skin, unspecified: Secondary | ICD-10-CM | POA: Diagnosis not present

## 2015-07-22 DIAGNOSIS — D485 Neoplasm of uncertain behavior of skin: Secondary | ICD-10-CM | POA: Diagnosis not present

## 2015-07-22 DIAGNOSIS — D2262 Melanocytic nevi of left upper limb, including shoulder: Secondary | ICD-10-CM | POA: Diagnosis not present

## 2015-07-22 DIAGNOSIS — L570 Actinic keratosis: Secondary | ICD-10-CM | POA: Diagnosis not present

## 2015-07-22 DIAGNOSIS — Z85820 Personal history of malignant melanoma of skin: Secondary | ICD-10-CM | POA: Diagnosis not present

## 2015-07-23 DIAGNOSIS — R42 Dizziness and giddiness: Secondary | ICD-10-CM | POA: Diagnosis not present

## 2015-08-04 DIAGNOSIS — R42 Dizziness and giddiness: Secondary | ICD-10-CM | POA: Diagnosis not present

## 2015-08-04 DIAGNOSIS — I119 Hypertensive heart disease without heart failure: Secondary | ICD-10-CM | POA: Diagnosis not present

## 2015-08-04 DIAGNOSIS — N183 Chronic kidney disease, stage 3 (moderate): Secondary | ICD-10-CM | POA: Diagnosis not present

## 2015-09-05 ENCOUNTER — Encounter: Payer: Self-pay | Admitting: Neurology

## 2015-09-05 ENCOUNTER — Ambulatory Visit (INDEPENDENT_AMBULATORY_CARE_PROVIDER_SITE_OTHER): Payer: Medicare Other | Admitting: Neurology

## 2015-09-05 VITALS — BP 122/68 | HR 62 | Ht 70.5 in | Wt 194.5 lb

## 2015-09-05 DIAGNOSIS — G43909 Migraine, unspecified, not intractable, without status migrainosus: Secondary | ICD-10-CM | POA: Diagnosis not present

## 2015-09-05 DIAGNOSIS — R569 Unspecified convulsions: Secondary | ICD-10-CM

## 2015-09-05 NOTE — Patient Instructions (Signed)

## 2015-09-05 NOTE — Progress Notes (Signed)
Reason for visit: Seizures  Kenneth Ortega is an 64 y.o. male  History of present illness:  Kenneth Ortega is a 64 year old right-handed white male with a history of seizures. The patient has done very well, he has not had any recurrence since last seen. The patient is on Lamictal, tolerating the medication well. The patient had some episodes of dizziness in the spring of 2016, the patient underwent a cardiology evaluation, and he had MRI evaluation of the brain. He underwent 2-D echocardiogram that was relatively unremarkable. He had adjustments in his blood pressure medications, this seemed to alleviate his symptoms. The patient denies any other new medical issues that have come up since last seen. He does complain of some arthritis in the left shoulder and left knee.   Past Medical History  Diagnosis Date  . Seizure ([place])   . Cancer ([facility])     Skin, melanoma  . Prostatitis   . Renal cell carcinoma of left kidney ([facility])   . Chronic renal insufficiency   . Memory disturbance   . Headache(784.0)     Migraine  . Hypertension   . Dyslipidemia   . Obstructive sleep apnea on CPAP   . Rotator cuff tear     Left    Past Surgical History  Procedure Laterality Date  . Skin cancer destruction    . Kidney surgery  2007    Removed  . Pilonidal cyst resection    . Arthroscopic surgery      Left knee  . Incisional hernia repair      Left flank    Family History  Problem Relation Age of Onset  . Cancer Mother   . Heart Problems Mother   . Throat cancer Father   . Diabetes Sister     Social history:  reports that he has never smoked. He has never used smokeless tobacco. He reports that he drinks about 0.6 oz of alcohol per week. He reports that he does not use illicit drugs.    Allergies  Allergen Reactions  . Tylox [Oxycodone-Acetaminophen]   . Keppra [Levetiracetam]     Patient unsure of reaction    Medications:  Prior to Admission medications   Medication Sig Start  Date End Date Taking? Authorizing Provider  BENICAR 5 MG tablet Take 1 tablet by mouth daily. 08/04/15  Yes Historical Provider, MD  FLUoxetine (PROZAC) 20 MG capsule Take 20 mg by mouth daily. 06/09/13  Yes Historical Provider, MD  lamoTRIgine (LAMICTAL) 200 MG tablet Take 1 tablet (200 mg total) by mouth 2 (two) times daily. 03/11/15  Yes Dennie Bible, NP  Medium Chain Triglycerides 70 GM/100GM POWD Take 1 scoop by mouth daily.   Yes Historical Provider, MD  mometasone (NASONEX) 50 MCG/ACT nasal spray Place 2 sprays into the nose daily.   Yes Historical Provider, MD  simvastatin (ZOCOR) 20 MG tablet Take 1 tablet by mouth daily. 06/09/15  Yes Historical Provider, MD  tamsulosin (FLOMAX) 0.4 MG CAPS capsule Take 0.4 mg by mouth daily.   Yes Historical Provider, MD  zolpidem (AMBIEN) 10 MG tablet Take 1 tablet (10 mg total) by mouth at bedtime as needed for sleep. 11/02/13  Yes Kathrynn Ducking, MD    ROS:  Out of a complete 14 system review of symptoms, the patient complains only of the following symptoms, and all other reviewed systems are negative.  Fatigue Sleep apnea, daytime sleepiness, snoring Joint pain, achy muscles, neck pain, neck stiffness Dizziness, seizures Depression  Blood pressure 122/68, pulse 62, height 5' 10.5" (1.791 m), weight 194 lb 8 oz (88.225 kg).  Physical Exam  General: The patient is alert and cooperative at the time of the examination.  Skin: No significant peripheral edema is noted.   Neurologic Exam  Mental status: The patient is alert and oriented x 3 at the time of the examination. The patient has apparent normal recent and remote memory, with an apparently normal attention span and concentration ability.   Cranial nerves: Facial symmetry is present. Speech is normal, no aphasia or dysarthria is noted. Extraocular movements are full. Visual fields are full.  Motor: The patient has good strength in all 4 extremities.  Sensory examination:  Soft touch sensation is symmetric on the face, arms, and legs.  Coordination: The patient has good finger-nose-finger and heel-to-shin bilaterally.  Gait and station: The patient has a normal gait. Tandem gait is normal. Romberg is negative. No drift is seen.  Reflexes: Deep tendon reflexes are symmetric.   Assessment/Plan:  1. History of seizures  The patient is doing quite well at this time. He is operating a motor vehicle without difficulty. We will continue the Lamictal, he will follow-up in one year, sooner if needed.  Jill Alexanders MD 09/05/2015 1:12 PM  [facility] [address] [place], [place] [address]  Phone [phone] Fax [phone]

## 2015-11-14 DIAGNOSIS — F329 Major depressive disorder, single episode, unspecified: Secondary | ICD-10-CM | POA: Diagnosis not present

## 2015-11-14 DIAGNOSIS — I10 Essential (primary) hypertension: Secondary | ICD-10-CM | POA: Diagnosis not present

## 2015-11-14 DIAGNOSIS — R739 Hyperglycemia, unspecified: Secondary | ICD-10-CM | POA: Diagnosis not present

## 2015-11-20 DIAGNOSIS — Z1389 Encounter for screening for other disorder: Secondary | ICD-10-CM | POA: Diagnosis not present

## 2015-11-20 DIAGNOSIS — G4700 Insomnia, unspecified: Secondary | ICD-10-CM | POA: Diagnosis not present

## 2015-11-20 DIAGNOSIS — E119 Type 2 diabetes mellitus without complications: Secondary | ICD-10-CM | POA: Diagnosis not present

## 2015-11-20 DIAGNOSIS — E785 Hyperlipidemia, unspecified: Secondary | ICD-10-CM | POA: Diagnosis not present

## 2015-11-20 DIAGNOSIS — N400 Enlarged prostate without lower urinary tract symptoms: Secondary | ICD-10-CM | POA: Diagnosis not present

## 2016-01-26 ENCOUNTER — Ambulatory Visit (INDEPENDENT_AMBULATORY_CARE_PROVIDER_SITE_OTHER): Payer: Medicare Other | Admitting: Neurology

## 2016-01-26 ENCOUNTER — Encounter: Payer: Self-pay | Admitting: Neurology

## 2016-01-26 VITALS — BP 130/80 | HR 70 | Resp 20 | Ht 70.0 in | Wt 189.0 lb

## 2016-01-26 DIAGNOSIS — G4730 Sleep apnea, unspecified: Secondary | ICD-10-CM

## 2016-01-26 DIAGNOSIS — Z9989 Dependence on other enabling machines and devices: Secondary | ICD-10-CM

## 2016-01-26 DIAGNOSIS — G4700 Insomnia, unspecified: Secondary | ICD-10-CM

## 2016-01-26 DIAGNOSIS — G4733 Obstructive sleep apnea (adult) (pediatric): Secondary | ICD-10-CM | POA: Diagnosis not present

## 2016-01-26 NOTE — Progress Notes (Signed)
SLEEP MEDICINE CLINIC   Provider:  Larey Seat, M D  Referring Provider: [name], MD Primary Care Physician:  [name], MD  Chief Complaint  Patient presents with  . New Patient (Initial Visit)    needs new cpap, uses [facility], rm 10, alone    HPI:  Kenneth Ortega is a 65 y.o. male , seen here as a referral  from Dr. Maudie Mercury and Dr. Jannifer Franklin,  Kenneth Ortega is an established sleep patient and has been followed by Dr. Floyde Parkins here in the practice for many years.  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 He is referred by Dr. Maudie Mercury for a new consultation because his sleep apnea machine is no longer technically supported, there are no longer parts available. He states that he sleeps terrible without CPAP and therefore needs soon and new one. The patient has a history of diabetes type 2, sleep apnea with insomnia, benign prostatic hyperplasia, hyperlipidemia, he is taking a generic form of Ambien at bedtime as needed I had seen Kenneth Ortega for a sleep study 10 years ago in 2007. The patient's medical history has to be updated he has now high cholesterol, he has some respiratory allergies, depression, leg edema, he was diagnosed with a hypernephroma kidney cancer and his left kidney was removed, he also was diagnosed with melanoma and he had a hernia following the kidney surgery.  The patient was seen for a diagnostic polysomnography on 08/29/2006 at the time he presented at age 66 with a history of a seizure disorder, depression, this a sleep complaint of snoring and nonrestorative sleep. His AHI was 15.9, oxygen nadir 88% saturation without prolonged desaturation time. He was snoring loudly, total arousal index was only 5.2. He returned for titration. And he has been on CPAP ever since it is the same machine he originally acquired. He brought his CPAP  with him today. Unfortunately the are no longer able to download data from the machine. He is definitely qualify to have a new machine but as he is now on Medicare we need to requalify him for that criteria. I will therefore order a new sleep study, officially as a CPAP re-titration. He cannot sleep without the machine. He uses a fit bit, too. He is a restless sleeper.    Sleep habits are as follows: 9 PM to 7 AM.  He reads in bed. He sleeps quickly. He has periods of insomnia, but less on CPAP.  1-2 nocturia, usually he sleeps through the night until about 7 AM. He will wake spontaneously at that time if he has to rise early he will need to set an alarm. He sleeps supine and in lateral position, and usually sleeps on one pillow only. The bedroom is cool, quiet and dark. Because he is still a very restless sleeper his wife sleeps in a separate room.  Sleep medical history and family sleep history:  Insomnia, Snorer, OSA now on CPAP for almost 10 years.   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    Review of Systems: Out of a complete 14 system review, the patient complains of only the following symptoms, and all other reviewed systems are negative. Restless, snoring.   Epworth score 3  , Fatigue severity  score 27  , depression score    Social History   Social History  . Marital Status: Married    Spouse Name: Olin Hauser  . Number of Children: 0  . Years of Education: HS   Occupational History  . 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 Smoking status: Never Smoker   . Smokeless tobacco: Never Used  . Alcohol Use: 0.6 oz/week    1 Cans of beer per week     Comment: OCC  . Drug Use: No  . Sexual Activity: Not on file   Other Topics Concern  . Not on file   Social History Narrative   Patient lives at home with his wife Olin Hauser). Patient is retired. High school education.     Right handed.   Caffeine- one or two cups daily    Family History  Problem Relation Age of Onset  . Cancer Mother   . Heart Problems Mother   . Throat cancer Father   . Diabetes Sister     Past Medical History  Diagnosis Date  . Seizure ([place])   . Cancer (HCC)     Skin, melanoma  . Prostatitis   . Renal cell carcinoma of left kidney (HCC)   . Chronic renal insufficiency   . Memory disturbance   . Headache(784.0)     Migraine  . Hypertension   . Dyslipidemia   . Obstructive sleep apnea on CPAP   . Rotator cuff tear     Left    Past Surgical History  Procedure Laterality Date  . Skin cancer destruction    . Kidney surgery  2007    Removed  . Pilonidal cyst resection    . Arthroscopic surgery      Left knee  . Incisional hernia repair      Left flank    Current Outpatient Prescriptions  Medication Sig Dispense Refill  . BENICAR 5 MG tablet Take 1 tablet by mouth daily.    Marland Kitchen FLUoxetine (PROZAC) 20 MG capsule Take 20 mg by mouth daily.    Marland Kitchen lamoTRIgine (LAMICTAL) 200 MG tablet Take 1 tablet (200 mg total) by mouth 2 (two) times daily. 60 tablet 2  . Medium Chain Triglycerides 70 GM/100GM POWD Take 1 scoop by mouth daily.    . mometasone (NASONEX) 50 MCG/ACT nasal spray Place 2 sprays into the nose daily.    . simvastatin (ZOCOR) 20 MG tablet Take 1 tablet by mouth daily.    . tamsulosin (FLOMAX) 0.4 MG CAPS capsule Take 0.4 mg by mouth daily.    Marland Kitchen zolpidem (AMBIEN) 10 MG tablet Take 1 tablet (10 mg total) by mouth at bedtime as needed for sleep. 90 tablet 1   No current facility-administered medications for this visit.    Allergies as of 01/26/2016 - Review Complete 01/26/2016  Allergen Reaction Noted  . Tylox [oxycodone-acetaminophen]  06/19/2013  . Keppra [levetiracetam]  06/19/2013  . Oxycodone Other (See Comments) 01/26/2016    Vitals: BP 130/80 mmHg  Pulse 70  Resp 20  Ht 5\' 10"  (1.778 m)  Wt 189 lb (85.73 kg)  BMI 27.12 kg/m2 Last Weight:  Wt  Readings from Last 1 Encounters:  01/26/16 189 lb (85.73 kg)   [id number] mass index is 27.12 kg/(m^2).     Last Height:   Ht Readings from Last 1 Encounters:  01/26/16 5\' 10"  (1.778 m)    Physical exam:  General: The patient is awake, alert and  appears not in acute distress. The patient is well groomed. Head: Normocephalic, atraumatic. Neck is supple. Mallampati 3,  neck circumference:16.75 . Nasal airflow unrestricted , TMJ click not evident  . Retrognathia is not seen.  Cardiovascular:  Regular rate and rhythm , without  murmurs or carotid bruit, and without distended neck veins. Respiratory: Lungs are clear to auscultation. Skin:  Without evidence of edema, or rash Trunk:  The patient's posture is erect   Neurologic exam : The patient is awake and alert, oriented to place and time.   Memory subjective described as intact.  Attention span & concentration ability appears normal.  Speech is fluent,  without dysarthria, dysphonia or aphasia.  Mood and affect are appropriate.  Cranial nerves: Pupils are equal and briskly reactive to light.  Extraocular movements  in vertical and horizontal planes intact and without nystagmus. Visual fields by finger perimetry are intact. Hearing to finger rub intact.   Facial sensation intact to fine touch.  Facial motor strength is symmetric and tongue and uvula move midline. Shoulder shrug was symmetrical.   Motor exam:   Normal tone, muscle bulk and symmetric strength in all extremities. Sensory:  Fine touch, pinprick and vibration were tested in all extremities. Proprioception tested in the upper extremities was normal. Coordination: Rapid alternating movements in the fingers/hands are normal.  Gait and station: Patient walks without assistive device and is able unassisted to climb up to the exam table. Strength within normal limits. Stance is stable and normal.   Deep tendon reflexes: in the upper and lower extremities are symmetric and intact.  Babinski maneuver response is downgoing.  The patient was advised of the nature of the diagnosed sleep disorder, the treatment options and risks for general a health and wellness arising from not treating the condition.  I spent more than 30 minutes of face to face time with the patient. Greater than 50% of time was spent in counseling and coordination of care. We have discussed the diagnosis and differential and I answered the patient's questions.     Assessment:  After physical and neurologic examination, review of laboratory studies,  Personal review of imaging studies, reports of other /same  Imaging studies,  Results of polysomnography/ neurophysiology testing and pre-existing records as far as provided in visit., my assessment is   1) Retirtration ordered, with one hour baseline as patient needs to qualify for medicare for a new CPAP.   2) patient has been a very compliant patient for 10 years, insomnia when not using CPAP !   Plan:  Treatment plan and additional workup :  CPAP retitration as a modified SPLIT- one hour baseline.   Asencion Partridge Heston Widener MD  01/26/2016   CC: [name], Md [address] [place], [place] [address]

## 2016-01-28 ENCOUNTER — Encounter: Payer: Self-pay | Admitting: *Deleted

## 2016-01-29 ENCOUNTER — Ambulatory Visit (INDEPENDENT_AMBULATORY_CARE_PROVIDER_SITE_OTHER): Payer: Medicare Other | Admitting: Neurology

## 2016-01-29 DIAGNOSIS — G4730 Sleep apnea, unspecified: Principal | ICD-10-CM

## 2016-01-29 DIAGNOSIS — G4700 Insomnia, unspecified: Secondary | ICD-10-CM

## 2016-01-29 DIAGNOSIS — G4733 Obstructive sleep apnea (adult) (pediatric): Secondary | ICD-10-CM

## 2016-01-29 DIAGNOSIS — Z9989 Dependence on other enabling machines and devices: Secondary | ICD-10-CM

## 2016-01-30 NOTE — Sleep Study (Signed)
Please see the scanned sleep study interpretation located in the procedure tab in the chart view section.  

## 2016-02-05 ENCOUNTER — Telehealth: Payer: Self-pay

## 2016-02-05 DIAGNOSIS — G4733 Obstructive sleep apnea (adult) (pediatric): Secondary | ICD-10-CM

## 2016-02-05 NOTE — Telephone Encounter (Signed)
Pt called back, was parked, but when I picked up, he was not there. I called his cell and home again but no answer on either one.

## 2016-02-05 NOTE — Telephone Encounter (Signed)
Called pt to discuss his sleep study results. No answer, left a message asking him to call me back. Left a message on both home and cell numbers.

## 2016-02-09 NOTE — Telephone Encounter (Signed)
Spoke to pt regarding his sleep study results. I advised him that moderate sleep apnea was seen in his study and Dr. Brett Fairy recommends starting a cpap. Pt is agreeable and says that he has been using [facility]. I advised pt that I would send the order to [facility] and they should call him within a week to get his cpap set up. Pt verbalized understanding. A follow up appt was made for 6/22 at 2:30. Pt verbalized understanding.

## 2016-02-16 DIAGNOSIS — G4700 Insomnia, unspecified: Secondary | ICD-10-CM | POA: Diagnosis not present

## 2016-02-16 DIAGNOSIS — Z79899 Other long term (current) drug therapy: Secondary | ICD-10-CM | POA: Diagnosis not present

## 2016-02-19 DIAGNOSIS — I10 Essential (primary) hypertension: Secondary | ICD-10-CM | POA: Diagnosis not present

## 2016-02-19 DIAGNOSIS — E7800 Pure hypercholesterolemia, unspecified: Secondary | ICD-10-CM | POA: Diagnosis not present

## 2016-02-19 DIAGNOSIS — J301 Allergic rhinitis due to pollen: Secondary | ICD-10-CM | POA: Diagnosis not present

## 2016-02-19 DIAGNOSIS — Z23 Encounter for immunization: Secondary | ICD-10-CM | POA: Diagnosis not present

## 2016-02-19 DIAGNOSIS — R05 Cough: Secondary | ICD-10-CM | POA: Diagnosis not present

## 2016-04-02 DIAGNOSIS — Z9989 Dependence on other enabling machines and devices: Secondary | ICD-10-CM | POA: Diagnosis not present

## 2016-04-02 DIAGNOSIS — D649 Anemia, unspecified: Secondary | ICD-10-CM | POA: Diagnosis not present

## 2016-04-02 DIAGNOSIS — E785 Hyperlipidemia, unspecified: Secondary | ICD-10-CM | POA: Diagnosis not present

## 2016-04-02 DIAGNOSIS — G4733 Obstructive sleep apnea (adult) (pediatric): Secondary | ICD-10-CM | POA: Diagnosis not present

## 2016-04-02 DIAGNOSIS — C439 Malignant melanoma of skin, unspecified: Secondary | ICD-10-CM | POA: Diagnosis not present

## 2016-04-02 DIAGNOSIS — N182 Chronic kidney disease, stage 2 (mild): Secondary | ICD-10-CM | POA: Diagnosis not present

## 2016-04-02 DIAGNOSIS — Z85528 Personal history of other malignant neoplasm of kidney: Secondary | ICD-10-CM | POA: Diagnosis not present

## 2016-04-02 DIAGNOSIS — I10 Essential (primary) hypertension: Secondary | ICD-10-CM | POA: Diagnosis not present

## 2016-04-15 ENCOUNTER — Encounter: Payer: Self-pay | Admitting: Neurology

## 2016-04-15 ENCOUNTER — Ambulatory Visit (INDEPENDENT_AMBULATORY_CARE_PROVIDER_SITE_OTHER): Payer: Medicare Other | Admitting: Neurology

## 2016-04-15 VITALS — BP 126/82 | HR 70 | Resp 20 | Ht 70.0 in | Wt 190.0 lb

## 2016-04-15 DIAGNOSIS — G4733 Obstructive sleep apnea (adult) (pediatric): Secondary | ICD-10-CM | POA: Diagnosis not present

## 2016-04-15 DIAGNOSIS — Z9989 Dependence on other enabling machines and devices: Principal | ICD-10-CM

## 2016-04-15 DIAGNOSIS — G4761 Periodic limb movement disorder: Secondary | ICD-10-CM | POA: Diagnosis not present

## 2016-04-15 NOTE — Progress Notes (Signed)
SLEEP MEDICINE CLINIC   Provider:  Larey Seat, M D  Referring Provider: [name], MD Primary Care Physician:  [name], MD  Chief Complaint  Patient presents with  . Follow-up    cpap going well    HPI:  Kenneth Ortega is a 65 y.o. male , seen here as a referral  from Dr. Maudie Mercury and Dr. Jannifer Franklin,  Mr. Bessett is an established sleep patient and has been followed by Dr. Floyde Parkins here in the practice for many years.  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 He is referred by Dr. Maudie Mercury for a new consultation because his sleep apnea machine is no longer technically supported, there are no longer parts available. He states that he sleeps terrible without CPAP and therefore needs soon and new one. The patient has a history of diabetes type 2, sleep apnea with insomnia, benign prostatic hyperplasia, hyperlipidemia, he is taking a generic form of Ambien at bedtime as needed I had seen Mr. Lowers for a sleep study 10 years ago in 2007. The patient's medical history has to be updated he has now high cholesterol, he has some respiratory allergies, depression, leg edema, he was diagnosed with a hypernephroma kidney cancer and his left kidney was removed, he also was diagnosed with melanoma and he had a hernia following the kidney surgery.The patient was seen for a diagnostic polysomnography on 08/29/2006 at the time he presented at age 1 with a history of a seizure disorder, depression, this a sleep complaint of snoring and nonrestorative sleep. His AHI was 15.9, oxygen nadir 88% saturation without prolonged desaturation time. He was snoring loudly, total arousal index was only 5.2. He returned for titration. And he has been on CPAP ever since it is the same machine he originally acquired. He brought his CPAP with him today. Unfortunately the are no longer able  to download data from the machine. He is definitely qualify to have a new machine but as he is now on Medicare we need to requalify him for that criteria. I will therefore order a new sleep study, officially as a CPAP re-titration. He cannot sleep without the machine. He uses a fit bit, too. He is a restless sleeper.    Sleep habits are as follows: 9 PM to 7 AM.  He reads in bed. He sleeps quickly. He has periods of insomnia, but less on CPAP.  1-2 nocturia, usually he sleeps through the night until about 7 AM. He will wake spontaneously at that time if he has to rise early he will need to set an alarm. He sleeps supine and in lateral position, and usually sleeps on one pillow only. The bedroom is cool, quiet and dark. Because he is still a very restless sleeper his wife sleeps in a separate room.  Sleep medical history and family sleep history:  Insomnia, Snorer, OSA now on CPAP for almost 10 years.  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     Interval history 04/15/2016, Mr. Romberg underwent a sleep study on 01/29/2016 he was diagnosed with an AHI of 21.3 during supine sleep there was a significant increase in his apnea to 76.4, REM sleep was not seen during the first 2 hours of the study. In  addition to the significant positional apnea there were also frequent periodic limb movements with an arousal index of 12.2. He had no signs of tachybradycardia arrhythmia. He was titrated starting with 5 cm water pressure to a final CPAP pressure of 9 cm the AHI was still not completely alleviated but reduced to 2.4 per hour. The periodic limb movements did not improve significantly he was very restless throughout the night with frequent leg limb twitching. I decided to order an auto Pap machine between 6 and 12 cm water and he was fitted for an interface in form of a small size nasal pillow by [employer] the so-called  P10. Mr. Goodreau has seldom felt restless leg symptoms before initiating sleep but he states that the twitchiness at night is running in the family. His mother had the same sleep habits as him. I was also able to see today's CPAP compliance data. The patient is 100% compliant over the last 30 days with 7 hours and 47 minutes of daily use, the pressure is set between 6 and 12 cm water he does have quite significant air leaks with his nasal pillow. The AHI was 9.4 related to be air leaks.  I will change his interface . Rv in 90 days.   Review of Systems: Out of a complete 14 system review, the patient complains of only the following symptoms, and all other reviewed systems are negative. Restless, snoring.   Epworth score 3  , Fatigue severity score 27  , depression score    Social History   Social History  . Marital Status: Married    Spouse Name: Olin Hauser  . Number of Children: 0  . Years of Education: HS   Occupational History  . 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 Smoking status: Never Smoker   . Smokeless tobacco: Never Used  . Alcohol Use: 0.6 oz/week    1 Cans of beer per week     Comment: OCC  . Drug Use: No  . Sexual Activity: Not on file   Other Topics Concern  . Not on file   Social History Narrative   Patient lives at home with his wife Olin Hauser). Patient is retired. High school education.   Right handed.   Caffeine- one or two cups daily    Family History  Problem Relation Age of Onset  . Cancer Mother   . Heart Problems Mother   . Throat cancer Father   . Diabetes Sister     Past Medical History  Diagnosis Date  . Seizure ([facility])   . Cancer (H[facility])     Skin, melanoma  . Prostatitis   . Renal cell carcinoma of left kidney (HCC)   . Chronic renal insufficiency   . Memory disturbance   . Headache(784.0)     Migraine  . Hypertension   . Dyslipidemia   . Obstructive sleep apnea on CPAP   . Rotator cuff tear     Left     Past Surgical History  Procedure Laterality Date  . Skin cancer destruction    . Kidney surgery  2007    Removed  . Pilonidal cyst resection    . Arthroscopic surgery      Left knee  . Incisional hernia repair      Left flank    Current Outpatient Prescriptions  Medication Sig Dispense Refill  . BENICAR 5 MG tablet Take 1 tablet by mouth daily.    Marland Kitchen FLUoxetine (  PROZAC) 20 MG capsule Take 20 mg by mouth daily.    Marland Kitchen lamoTRIgine (LAMICTAL) 200 MG tablet Take 1 tablet (200 mg total) by mouth 2 (two) times daily. 60 tablet 2  . mometasone (NASONEX) 50 MCG/ACT nasal spray Place 2 sprays into the nose daily.    . simvastatin (ZOCOR) 20 MG tablet Take 1 tablet by mouth daily.    . tamsulosin (FLOMAX) 0.4 MG CAPS capsule Take 0.4 mg by mouth daily.    Marland Kitchen zolpidem (AMBIEN) 10 MG tablet Take 1 tablet (10 mg total) by mouth at bedtime as needed for sleep. 90 tablet 1   No current facility-administered medications for this visit.    Allergies as of 04/15/2016 - Review Complete 04/15/2016  Allergen Reaction Noted  . Tylox [oxycodone-acetaminophen]  06/19/2013  . Keppra [levetiracetam]  06/19/2013  . Oxycodone Other (See Comments) 01/26/2016    Vitals: BP 126/82 mmHg  Pulse 70  Resp 20  Ht 5\' 10"  (1.778 m)  Wt 190 lb (86.183 kg)  BMI 27.26 kg/m2 Last Weight:  Wt Readings from Last 1 Encounters:  04/15/16 190 lb (86.183 kg)   [id number] mass index is 27.26 kg/(m^2).     Last Height:   Ht Readings from Last 1 Encounters:  04/15/16 5\' 10"  (1.778 m)    Physical exam:  General: The patient is awake, alert and appears not in acute distress. The patient is well groomed. Head: Normocephalic, atraumatic. Neck is supple. Mallampati 3,  neck circumference:16.75 . Nasal airflow unrestricted , TMJ click not evident  . Retrognathia is not seen.  Cardiovascular:  Regular rate and rhythm , without  murmurs or carotid bruit, and without distended neck veins. Respiratory: Lungs are clear to  auscultation. Skin:  Without evidence of edema, or rash Trunk:  The patient's posture is erect   Neurologic exam : The patient is awake and alert, oriented to place and time.   Memory subjective described as intact.  Attention span & concentration ability appears normal.  Speech is fluent,  without dysarthria, dysphonia or aphasia.  Mood and affect are appropriate.  Cranial nerves: Pupils are equal and briskly reactive to light.  Extraocular movements  in vertical and horizontal planes intact and without nystagmus. Visual fields by finger perimetry are intact. Hearing to finger rub intact.   Facial sensation intact to fine touch.  Facial motor strength is symmetric and tongue and uvula move midline. Shoulder shrug was symmetrical.   The patient was advised of the nature of the diagnosed sleep disorder, the treatment options and risks for general a health and wellness arising from not treating the condition.  I spent more than 30 minutes of face to face time with the patient. Greater than 50% of time was spent in counseling and coordination of care. We have discussed the diagnosis and differential and I answered the patient's questions.     Assessment:  After physical and neurologic examination, review of laboratory studies,  Personal review of imaging studies, reports of other /same  Imaging studies,  Results of polysomnography/ neurophysiology testing and pre-existing records as far as provided in visit., my assessment is   1)CPAP for OSA, 2) PLM disorder, 3) insomnia when not using CPAP !   Plan:  Treatment plan and additional workup :  CPAP refitted for nasal mask with Robin Hyler.   Asencion Partridge Deandra Goering MD  04/15/2016   CC: [name], Md [address] [place], [place] [address]

## 2016-04-17 ENCOUNTER — Other Ambulatory Visit: Payer: Self-pay | Admitting: Nurse Practitioner

## 2016-04-29 ENCOUNTER — Other Ambulatory Visit: Payer: Self-pay | Admitting: Internal Medicine

## 2016-04-29 DIAGNOSIS — R200 Anesthesia of skin: Secondary | ICD-10-CM | POA: Diagnosis not present

## 2016-04-29 DIAGNOSIS — W57XXXA Bitten or stung by nonvenomous insect and other nonvenomous arthropods, initial encounter: Secondary | ICD-10-CM | POA: Diagnosis not present

## 2016-05-03 ENCOUNTER — Telehealth: Payer: Self-pay | Admitting: Neurology

## 2016-05-03 ENCOUNTER — Ambulatory Visit
Admission: RE | Admit: 2016-05-03 | Discharge: 2016-05-03 | Disposition: A | Discharge status: Home / Self Care | Payer: Medicare Other | Source: Ambulatory Visit | Attending: Internal Medicine | Admitting: Internal Medicine

## 2016-05-03 DIAGNOSIS — R200 Anesthesia of skin: Secondary | ICD-10-CM | POA: Diagnosis not present

## 2016-05-03 MED ORDER — LAMOTRIGINE 200 MG PO TABS
200.0000 mg | ORAL_TABLET | Freq: Two times a day (BID) | ORAL | Status: DC
Start: 2016-05-03 — End: 2017-05-17

## 2016-05-03 NOTE — Telephone Encounter (Signed)
Patient requesting refill of lamoTRIgine (LAMICTAL) 200 MG tablet Pharmacy [facility]

## 2016-05-03 NOTE — Telephone Encounter (Signed)
Refills sent to mailorder pharmacy as requested

## 2016-05-25 ENCOUNTER — Encounter: Payer: Self-pay | Admitting: Neurology

## 2016-05-25 ENCOUNTER — Ambulatory Visit (INDEPENDENT_AMBULATORY_CARE_PROVIDER_SITE_OTHER): Payer: Medicare Other | Admitting: Neurology

## 2016-05-25 VITALS — BP 114/71 | HR 60 | Ht 70.0 in | Wt 185.5 lb

## 2016-05-25 DIAGNOSIS — I10 Essential (primary) hypertension: Secondary | ICD-10-CM | POA: Diagnosis not present

## 2016-05-25 DIAGNOSIS — R202 Paresthesia of skin: Secondary | ICD-10-CM

## 2016-05-25 DIAGNOSIS — R739 Hyperglycemia, unspecified: Secondary | ICD-10-CM | POA: Diagnosis not present

## 2016-05-25 DIAGNOSIS — R569 Unspecified convulsions: Secondary | ICD-10-CM

## 2016-05-25 DIAGNOSIS — Z125 Encounter for screening for malignant neoplasm of prostate: Secondary | ICD-10-CM | POA: Diagnosis not present

## 2016-05-25 DIAGNOSIS — E291 Testicular hypofunction: Secondary | ICD-10-CM | POA: Diagnosis not present

## 2016-05-25 NOTE — Progress Notes (Signed)
Reason for visit: Seizures  Kenneth Ortega is an 65 y.o. male  History of present illness:  Kenneth Ortega is a 65 year old right-handed white male with a history of seizures that have been well controlled on Lamictal. The patient also has obstructive sleep apnea on CPAP. He is doing well with this. He denies any further seizures since last seen. The patient had an episode of numbness of the lower face, hands, and feet that developed around 04/24/2016. Interestingly, the patient indicates that he had run out of his Lamictal several days prior, over the next 2 weeks, the numbness gradually improved. The patient underwent a CT scan of the head on 05/03/2016 that was unremarkable. The patient has had some blood work done through his primary care physician, the patient indicates that this included a Lyme antibody panel that was unremarkable. The patient at this time feels back to his usual baseline. He returns to this office for an evaluation.  Past Medical History:  Diagnosis Date  . Cancer (HCC)    Skin, melanoma  . Chronic renal insufficiency   . Dyslipidemia   . Headache(784.0)    Migraine  . Hypertension   . Memory disturbance   . Obstructive sleep apnea on CPAP   . Prostatitis   . Renal cell carcinoma of left kidney (HCC)   . Rotator cuff tear    Left  . Seizure [facility])     Past Surgical History:  Procedure Laterality Date  . Arthroscopic surgery     Left knee  . INCISIONAL HERNIA REPAIR     Left flank  . KIDNEY SURGERY  2007   Removed  . Pilonidal cyst resection    . SKIN CANCER DESTRUCTION      Family History  Problem Relation Age of Onset  . Cancer Mother   . Heart Problems Mother   . Throat cancer Father   . Diabetes Sister     Social history:  reports that he has never smoked. He has never used smokeless tobacco. He reports that he drinks about 0.6 oz of alcohol per week . He reports that he does not use drugs.    Allergies  Allergen Reactions  . Tylox  [Oxycodone-Acetaminophen]   . Keppra [Levetiracetam]     Patient unsure of reaction  . Oxycodone Other (See Comments)    hallucinations     Medications:  Prior to Admission medications   Medication Sig Start Date End Date Taking? Authorizing Provider  BENICAR 5 MG tablet Take 1 tablet by mouth daily. 08/04/15  Yes Historical Provider, MD  FLUoxetine (PROZAC) 20 MG capsule Take 20 mg by mouth daily. 06/09/13  Yes Historical Provider, MD  lamoTRIgine (LAMICTAL) 200 MG tablet Take 1 tablet (200 mg total) by mouth 2 (two) times daily. 05/03/16  Yes Kathrynn Ducking, MD  mometasone (NASONEX) 50 MCG/ACT nasal spray Place 2 sprays into the nose daily.   Yes Historical Provider, MD  simvastatin (ZOCOR) 20 MG tablet Take 1 tablet by mouth daily. 06/09/15  Yes Historical Provider, MD  tamsulosin (FLOMAX) 0.4 MG CAPS capsule Take 0.4 mg by mouth daily.   Yes Historical Provider, MD  zolpidem (AMBIEN) 10 MG tablet Take 1 tablet (10 mg total) by mouth at bedtime as needed for sleep. 11/02/13  Yes Kathrynn Ducking, MD    ROS:  Out of a complete 14 system review of symptoms, the patient complains only of the following symptoms, and all other reviewed systems are negative.  Numbness,  seizures Depression Sleep apnea Itching  Blood pressure 114/71, pulse 60, height 5\' 10"  (1.778 m), weight 185 lb 8 oz (84.1 kg).  Physical Exam  General: The patient is alert and cooperative at the time of the examination.  Skin: No significant peripheral edema is noted.   Neurologic Exam  Mental status: The patient is alert and oriented x 3 at the time of the examination. The patient has apparent normal recent and remote memory, with an apparently normal attention span and concentration ability.   Cranial nerves: Facial symmetry is present. Speech is normal, no aphasia or dysarthria is noted. Extraocular movements are full. Visual fields are full.  Motor: The patient has good strength in all 4  extremities.  Sensory examination: Soft touch sensation is symmetric on the face, arms, and legs.  Coordination: The patient has good finger-nose-finger and heel-to-shin bilaterally.  Gait and station: The patient has a normal gait. Tandem gait is normal. Romberg is negative. No drift is seen.  Reflexes: Deep tendon reflexes are symmetric.   CT head 05/03/16:  IMPRESSION: 1.  Negative head CT  2.  Mild brain atrophy   Assessment/Plan:  1. History of seizures  2. Numbness, resolved  3. Sleep apnea  It is possible that the sensations of numbness and malaise may have been related to sudden withdrawal from the Lamictal. The patient will be sent for MRI of the brain, the CT scan of the brain was unremarkable but is inadequate to fully assess the onset of the sensory alteration. I recommended further blood work to include a vitamin B12 level, copper level, and ANA, sedimentation rate. The patient wishes to get this done through his primary care doctor. The patient will follow-up in 6 months.  Jill Alexanders MD 05/25/2016 9:18 AM  [facility] [address] [place], [place] [address]  Phone [phone] Fax [phone]

## 2016-05-25 NOTE — Patient Instructions (Signed)
   Consider vitamin B12, copper level, ANA, ESR evaluation for the numbness.

## 2016-05-31 ENCOUNTER — Ambulatory Visit (HOSPITAL_COMMUNITY)
Admission: RE | Admit: 2016-05-31 | Discharge: 2016-05-31 | Disposition: A | Discharge status: Home / Self Care | Payer: Medicare Other | Source: Ambulatory Visit | Attending: Urology | Admitting: Urology

## 2016-05-31 ENCOUNTER — Other Ambulatory Visit (HOSPITAL_COMMUNITY): Payer: Self-pay | Admitting: Urology

## 2016-05-31 DIAGNOSIS — Z125 Encounter for screening for malignant neoplasm of prostate: Secondary | ICD-10-CM | POA: Diagnosis not present

## 2016-05-31 DIAGNOSIS — Z85528 Personal history of other malignant neoplasm of kidney: Secondary | ICD-10-CM | POA: Insufficient documentation

## 2016-05-31 DIAGNOSIS — N183 Chronic kidney disease, stage 3 unspecified: Secondary | ICD-10-CM

## 2016-05-31 DIAGNOSIS — N138 Other obstructive and reflux uropathy: Secondary | ICD-10-CM

## 2016-05-31 DIAGNOSIS — N401 Enlarged prostate with lower urinary tract symptoms: Secondary | ICD-10-CM | POA: Diagnosis not present

## 2016-05-31 DIAGNOSIS — E7800 Pure hypercholesterolemia, unspecified: Secondary | ICD-10-CM | POA: Diagnosis not present

## 2016-05-31 DIAGNOSIS — R739 Hyperglycemia, unspecified: Secondary | ICD-10-CM | POA: Diagnosis not present

## 2016-05-31 DIAGNOSIS — I10 Essential (primary) hypertension: Secondary | ICD-10-CM | POA: Diagnosis not present

## 2016-06-01 ENCOUNTER — Telehealth: Payer: Self-pay | Admitting: Neurology

## 2016-06-01 NOTE — Telephone Encounter (Signed)
I have received blood work results from primary care physician done on 05/25/2016. The white blood count was 6.0, hemoglobin 13.6, hematocrit 41.2, platelets of 187. Chemistry profile shows a BUN of 22, creatinine 1.4, GFR of 52. Sodium 143, potassium 4.6 chloride of 104, CO2 28, calcium 9.5, total protein 7.3, albumin of 4.3, alk phosphatase of 63, AST 15, ALT 25, hemoglobin A1c of 6.0. PSA 2.67, sedimentation rate of 8 TSH of 0.41. Serum copper level was 98 vitamin B12 level was 458 ANA was negative.

## 2016-06-02 NOTE — Telephone Encounter (Signed)
Called w/ unremarkable lab results. Verbalized understanding and appreciation for call. 

## 2016-06-07 DIAGNOSIS — Z85528 Personal history of other malignant neoplasm of kidney: Secondary | ICD-10-CM | POA: Diagnosis not present

## 2016-06-07 DIAGNOSIS — Z125 Encounter for screening for malignant neoplasm of prostate: Secondary | ICD-10-CM | POA: Diagnosis not present

## 2016-06-08 ENCOUNTER — Ambulatory Visit
Admission: RE | Admit: 2016-06-08 | Discharge: 2016-06-08 | Disposition: A | Discharge status: Home / Self Care | Payer: Medicare Other | Source: Ambulatory Visit | Attending: Neurology | Admitting: Neurology

## 2016-06-08 DIAGNOSIS — R202 Paresthesia of skin: Secondary | ICD-10-CM | POA: Diagnosis not present

## 2016-06-09 ENCOUNTER — Telehealth: Payer: Self-pay | Admitting: Neurology

## 2016-06-09 NOTE — Telephone Encounter (Signed)
  I called the patient. The MRI of the brain shows no brain changes. He is to call if he has any questions.  MRI brain 06/09/16:  IMPRESSION:  Unremarkable MRI scan of the brain. Incidental changes of chronic paranasal sinusitis appears slightly more advanced compared with previous MRI dated 06/24/2015

## 2016-07-14 DIAGNOSIS — D239 Other benign neoplasm of skin, unspecified: Secondary | ICD-10-CM | POA: Diagnosis not present

## 2016-07-14 DIAGNOSIS — Z85820 Personal history of malignant melanoma of skin: Secondary | ICD-10-CM | POA: Diagnosis not present

## 2016-07-14 DIAGNOSIS — L281 Prurigo nodularis: Secondary | ICD-10-CM | POA: Diagnosis not present

## 2016-07-14 DIAGNOSIS — L570 Actinic keratosis: Secondary | ICD-10-CM | POA: Diagnosis not present

## 2016-07-28 DIAGNOSIS — K641 Second degree hemorrhoids: Secondary | ICD-10-CM | POA: Diagnosis not present

## 2016-07-28 DIAGNOSIS — K625 Hemorrhage of anus and rectum: Secondary | ICD-10-CM | POA: Diagnosis not present

## 2016-07-28 DIAGNOSIS — K645 Perianal venous thrombosis: Secondary | ICD-10-CM | POA: Diagnosis not present

## 2016-08-11 DIAGNOSIS — K625 Hemorrhage of anus and rectum: Secondary | ICD-10-CM | POA: Diagnosis not present

## 2016-09-06 ENCOUNTER — Ambulatory Visit: Payer: Medicare Other | Admitting: Neurology

## 2016-09-28 DIAGNOSIS — H2513 Age-related nuclear cataract, bilateral: Secondary | ICD-10-CM | POA: Diagnosis not present

## 2016-09-28 DIAGNOSIS — H43393 Other vitreous opacities, bilateral: Secondary | ICD-10-CM | POA: Diagnosis not present

## 2016-09-28 DIAGNOSIS — H04123 Dry eye syndrome of bilateral lacrimal glands: Secondary | ICD-10-CM | POA: Diagnosis not present

## 2016-10-20 ENCOUNTER — Other Ambulatory Visit: Payer: Self-pay | Admitting: Dermatology

## 2016-10-20 DIAGNOSIS — C44319 Basal cell carcinoma of skin of other parts of face: Secondary | ICD-10-CM | POA: Diagnosis not present

## 2016-10-21 DIAGNOSIS — Z23 Encounter for immunization: Secondary | ICD-10-CM | POA: Diagnosis not present

## 2016-11-18 ENCOUNTER — Other Ambulatory Visit: Payer: Self-pay | Admitting: Dermatology

## 2016-11-18 DIAGNOSIS — C44319 Basal cell carcinoma of skin of other parts of face: Secondary | ICD-10-CM | POA: Diagnosis not present

## 2016-11-24 DIAGNOSIS — K648 Other hemorrhoids: Secondary | ICD-10-CM | POA: Diagnosis not present

## 2016-11-25 DIAGNOSIS — C44319 Basal cell carcinoma of skin of other parts of face: Secondary | ICD-10-CM | POA: Diagnosis not present

## 2016-11-29 ENCOUNTER — Encounter: Payer: Self-pay | Admitting: Neurology

## 2016-11-29 ENCOUNTER — Ambulatory Visit (INDEPENDENT_AMBULATORY_CARE_PROVIDER_SITE_OTHER): Payer: Medicare Other | Admitting: Neurology

## 2016-11-29 VITALS — BP 117/78 | HR 71 | Resp 18 | Ht 70.0 in | Wt 189.0 lb

## 2016-11-29 DIAGNOSIS — E7800 Pure hypercholesterolemia, unspecified: Secondary | ICD-10-CM | POA: Diagnosis not present

## 2016-11-29 DIAGNOSIS — R739 Hyperglycemia, unspecified: Secondary | ICD-10-CM | POA: Diagnosis not present

## 2016-11-29 DIAGNOSIS — R202 Paresthesia of skin: Secondary | ICD-10-CM | POA: Diagnosis not present

## 2016-11-29 DIAGNOSIS — I10 Essential (primary) hypertension: Secondary | ICD-10-CM | POA: Diagnosis not present

## 2016-11-29 DIAGNOSIS — R569 Unspecified convulsions: Secondary | ICD-10-CM | POA: Diagnosis not present

## 2016-11-29 NOTE — Progress Notes (Signed)
Reason for visit: Seizures  Kenneth Ortega is an 66 y.o. male  History of present illness:  Kenneth Ortega is a 66 year old right-handed white male with a history of seizures that are associated with brief episodes of altered speech lasting only a few seconds. The patient may have up to 2 episodes a day, but this does not impair his ability to function. The patient remains on Lamictal, he is tolerating the medication well. He had a transient episode of numbness that was evaluated, MRI of the brain done previously was unremarkable. The patient indicates that he is having some paresthesias in this toes of both feet currently, this does not keep him awake at night, it is not painful for him. He does have a family history of diabetes, he has been followed for borderline blood sugars. He returns to the office today for an evaluation.  Past Medical History:  Diagnosis Date  . Cancer (HCC)    Skin, melanoma  . Chronic renal insufficiency   . Dyslipidemia   . Headache(784.0)    Migraine  . Hypertension   . Memory disturbance   . Obstructive sleep apnea on CPAP   . Prostatitis   . Renal cell carcinoma of left kidney (HCC)   . Rotator cuff tear    Left  . Seizure [facility])     Past Surgical History:  Procedure Laterality Date  . Arthroscopic surgery     Left knee  . INCISIONAL HERNIA REPAIR     Left flank  . KIDNEY SURGERY  2007   Removed  . Pilonidal cyst resection    . SKIN CANCER DESTRUCTION      Family History  Problem Relation Age of Onset  . Cancer Mother   . Heart Problems Mother   . Throat cancer Father   . Diabetes Sister     Social history:  reports that he has never smoked. He has never used smokeless tobacco. He reports that he drinks about 0.6 oz of alcohol per week . He reports that he does not use drugs.    Allergies  Allergen Reactions  . Tylox [Oxycodone-Acetaminophen]   . Keppra [Levetiracetam]     Patient unsure of reaction  . Oxycodone Other (See  Comments)    hallucinations     Medications:  Prior to Admission medications   Medication Sig Start Date End Date Taking? Authorizing Provider  BENICAR 5 MG tablet Take 1 tablet by mouth daily. 08/04/15  Yes Historical Provider, MD  FLUoxetine (PROZAC) 20 MG capsule Take 20 mg by mouth daily. 06/09/13  Yes Historical Provider, MD  lamoTRIgine (LAMICTAL) 200 MG tablet Take 1 tablet (200 mg total) by mouth 2 (two) times daily. 05/03/16  Yes Kathrynn Ducking, MD  mometasone (NASONEX) 50 MCG/ACT nasal spray Place 2 sprays into the nose daily.   Yes Historical Provider, MD  simvastatin (ZOCOR) 20 MG tablet Take 1 tablet by mouth daily. 06/09/15  Yes Historical Provider, MD  tamsulosin (FLOMAX) 0.4 MG CAPS capsule Take 0.4 mg by mouth daily.   Yes Historical Provider, MD  zolpidem (AMBIEN) 10 MG tablet Take 1 tablet (10 mg total) by mouth at bedtime as needed for sleep. 11/02/13  Yes Kathrynn Ducking, MD    ROS:  Out of a complete 14 system review of symptoms, the patient complains only of the following symptoms, and all other reviewed systems are negative.  Sleep apnea Joint pain Memory loss, seizures Decreased concentration, depression  Blood pressure 117/78, pulse  71, resp. rate 18, height 5\' 10"  (1.778 m), weight 189 lb (85.7 kg).  Physical Exam  General: The patient is alert and cooperative at the time of the examination.  Skin: No significant peripheral edema is noted.   Neurologic Exam  Mental status: The patient is alert and oriented x 3 at the time of the examination. The patient has apparent normal recent and remote memory, with an apparently normal attention span and concentration ability.   Cranial nerves: Facial symmetry is present. Speech is normal, no aphasia or dysarthria is noted. Extraocular movements are full. Visual fields are full.  Motor: The patient has good strength in all 4 extremities.  Sensory examination: Soft touch sensation is symmetric on the face, arms,  and legs.  Coordination: The patient has good finger-nose-finger and heel-to-shin bilaterally.  Gait and station: The patient has a normal gait. Tandem gait is normal. Romberg is negative. No drift is seen.  Reflexes: Deep tendon reflexes are symmetric.   MRI brain 06/09/16:  IMPRESSION: Unremarkable MRI scan of the brain. Incidental changes of chronic paranasal sinusitis appears slightly more advanced compared with previous MRI dated 06/24/2015  * MRI scan images were reviewed online. I agree with the written report.    Assessment/Plan:  1. History of seizures  2. Paresthesias of the feet  The patient has been about the same with his seizure-type events. He will remain on Lamictal for now. He has been noticing a new problem with paresthesias of the toes. This could represent an early peripheral neuropathy. We will check blood work in this regard today. He is having blood work through his primary care physician, I have written out a prescription for the blood work that we need. The patient will follow-up through this office in one year, sooner if needed. If the paresthesias worsen, nerve conduction studies can be done in the future.  Jill Alexanders MD 11/29/2016 7:55 AM  [facility] [address] [place], [place] [address]  Phone [phone] Fax [phone]

## 2016-12-06 ENCOUNTER — Ambulatory Visit (HOSPITAL_COMMUNITY)
Admission: RE | Admit: 2016-12-06 | Discharge: 2016-12-06 | Disposition: A | Discharge status: Home / Self Care | Payer: Medicare Other | Source: Ambulatory Visit | Attending: Urology | Admitting: Urology

## 2016-12-06 ENCOUNTER — Other Ambulatory Visit (HOSPITAL_COMMUNITY): Payer: Self-pay | Admitting: Urology

## 2016-12-06 DIAGNOSIS — Z125 Encounter for screening for malignant neoplasm of prostate: Secondary | ICD-10-CM | POA: Diagnosis not present

## 2016-12-06 DIAGNOSIS — E7800 Pure hypercholesterolemia, unspecified: Secondary | ICD-10-CM | POA: Diagnosis not present

## 2016-12-06 DIAGNOSIS — Z0000 Encounter for general adult medical examination without abnormal findings: Secondary | ICD-10-CM | POA: Diagnosis not present

## 2016-12-06 DIAGNOSIS — Z85528 Personal history of other malignant neoplasm of kidney: Secondary | ICD-10-CM | POA: Insufficient documentation

## 2016-12-06 DIAGNOSIS — I10 Essential (primary) hypertension: Secondary | ICD-10-CM | POA: Diagnosis not present

## 2016-12-06 DIAGNOSIS — E291 Testicular hypofunction: Secondary | ICD-10-CM | POA: Diagnosis not present

## 2016-12-06 DIAGNOSIS — R935 Abnormal findings on diagnostic imaging of other abdominal regions, including retroperitoneum: Secondary | ICD-10-CM | POA: Diagnosis not present

## 2017-01-12 DIAGNOSIS — C44319 Basal cell carcinoma of skin of other parts of face: Secondary | ICD-10-CM | POA: Diagnosis not present

## 2017-02-16 ENCOUNTER — Other Ambulatory Visit: Payer: Self-pay | Admitting: Dermatology

## 2017-02-16 DIAGNOSIS — D225 Melanocytic nevi of trunk: Secondary | ICD-10-CM | POA: Diagnosis not present

## 2017-02-16 DIAGNOSIS — D492 Neoplasm of unspecified behavior of bone, soft tissue, and skin: Secondary | ICD-10-CM | POA: Diagnosis not present

## 2017-02-16 DIAGNOSIS — Z85828 Personal history of other malignant neoplasm of skin: Secondary | ICD-10-CM | POA: Diagnosis not present

## 2017-02-16 DIAGNOSIS — D229 Melanocytic nevi, unspecified: Secondary | ICD-10-CM | POA: Diagnosis not present

## 2017-04-13 ENCOUNTER — Encounter: Payer: Self-pay | Admitting: Neurology

## 2017-04-18 ENCOUNTER — Encounter: Payer: Self-pay | Admitting: Neurology

## 2017-04-18 ENCOUNTER — Ambulatory Visit (INDEPENDENT_AMBULATORY_CARE_PROVIDER_SITE_OTHER): Payer: Medicare Other | Admitting: Neurology

## 2017-04-18 VITALS — BP 111/66 | HR 72 | Ht 70.0 in | Wt 189.5 lb

## 2017-04-18 DIAGNOSIS — Z9989 Dependence on other enabling machines and devices: Secondary | ICD-10-CM | POA: Diagnosis not present

## 2017-04-18 DIAGNOSIS — G4733 Obstructive sleep apnea (adult) (pediatric): Secondary | ICD-10-CM | POA: Diagnosis not present

## 2017-04-18 NOTE — Progress Notes (Signed)
Reason for visit: Seizures  Kenneth Ortega is an 66 y.o. male patient of Dr Jannifer Franklin, followed by him for absence seizures.   History of present illness:  Kenneth Ortega is a 66 year old right-handed white male with a history of seizures that are associated with brief episodes of altered speech lasting only a few seconds. The patient may have up to 2 episodes a day, but this does not impair his ability to function. The patient remains on Lamictal, he is tolerating the medication well. He had a transient episode of numbness that was evaluated, MRI of the brain done previously was unremarkable. The patient indicates that he is having some paresthesias in this toes of both feet currently, this does not keep him awake at night, it is not painful for him. He does have a family history of diabetes, he has been followed for borderline blood sugars. He returns to the office today for an evaluation.  6.25.2018 Kenneth Ortega returns today for a CPAP compliance visit, he has used the machine highly compliantly at 93% of the time with a residual AHI of 4.1, his machine is an AutoSet between 6 and 12 cm water with 3 cm expiratory pressure relief. The 95th percentile pressure is 11.5. The residual apneas are almost equal obstructive and central in nature. I would not change the settings on the machine. The patient also endorsed the geriatric depression score at only 2 out of 15 points, the Epworth sleepiness score is still high at 13 points. He reports his renal disease is stabile. He has no idea if he still has absences.    Past Medical History:  Diagnosis Date  . Cancer (HCC)    Skin, melanoma  . Chronic renal insufficiency   . Dyslipidemia   . Headache(784.0)    Migraine  . Hypertension   . Memory disturbance   . Obstructive sleep apnea on CPAP   . Prostatitis   . Renal cell carcinoma of left kidney (HCC)   . Rotator cuff tear    Left  . Seizure [facility])     Past Surgical History:  Procedure  Laterality Date  . Arthroscopic surgery     Left knee  . INCISIONAL HERNIA REPAIR     Left flank  . KIDNEY SURGERY  2007   Removed  . Pilonidal cyst resection    . SKIN CANCER DESTRUCTION      Family History  Problem Relation Age of Onset  . Cancer Mother   . Heart Problems Mother   . Throat cancer Father   . Diabetes Sister     Social history:  reports that he has never smoked. He has never used smokeless tobacco. He reports that he drinks about 0.6 oz of alcohol per week . He reports that he does not use drugs.    Allergies  Allergen Reactions  . Tylox [Oxycodone-Acetaminophen]   . Keppra [Levetiracetam]     Patient unsure of reaction  . Oxycodone Other (See Comments)    hallucinations     Medications:  Prior to Admission medications   Medication Sig Start Date End Date Taking? Authorizing Provider  BENICAR 5 MG tablet Take 1 tablet by mouth daily. 08/04/15  Yes Historical Provider, MD  FLUoxetine (PROZAC) 20 MG capsule Take 20 mg by mouth daily. 06/09/13  Yes Historical Provider, MD  lamoTRIgine (LAMICTAL) 200 MG tablet Take 1 tablet (200 mg total) by mouth 2 (two) times daily. 05/03/16  Yes Kathrynn Ducking, MD  mometasone (NASONEX)  50 MCG/ACT nasal spray Place 2 sprays into the nose daily.   Yes Historical Provider, MD  simvastatin (ZOCOR) 20 MG tablet Take 1 tablet by mouth daily. 06/09/15  Yes Historical Provider, MD  tamsulosin (FLOMAX) 0.4 MG CAPS capsule Take 0.4 mg by mouth daily.   Yes Historical Provider, MD  zolpidem (AMBIEN) 10 MG tablet Take 1 tablet (10 mg total) by mouth at bedtime as needed for sleep. 11/02/13  Yes Kathrynn Ducking, MD    ROS:  Out of a complete 14 system review of symptoms, the patient complains only of the following symptoms, and all other reviewed systems are negative.  Sleep apnea on CPAP- well controlled.  Joint pain, stiffness Seizures Decreased concentration  Blood pressure 111/66, pulse 72, height 5\' 10"  (1.778 m), weight 189  lb 8 oz (86 kg).  Physical Exam  General: The patient is alert and cooperative at the time of the examination. Skin: No significant peripheral edema is noted. Neurologic Exam Mental status: The patient is alert and oriented x 3 at the time of the examination. The patient has apparent normal recent and remote memory, with an apparently normal attention span and concentration ability. Cranial nerves: Facial symmetry is present. Speech is normal, no aphasia or dysarthria is noted. Extraocular movements are full. Visual fields are full. Motor: intact  strength in all 4 extremities. Sensory examination: Soft touch sensation is symmetric on the face, arms, and legs. Coordination: The patient has intact finger-nose-finger movement  bilaterally. Gait and station: The patient has a normal gait.  Reflexes: Deep tendon reflexes are symmetric.    Assessment/Plan:  1. OSA on CPAP. Auto pap.   2. History of seizures  3. Paresthesias of the feet  The patient has been  Highly compliant with CPAP use and compliant with medication. He will remain on CPAP and on  Lamictal . He has been noticing a new problem with paresthesias of the toes. This could represent an early peripheral neuropathy. Dr Jannifer Franklin will follow this.  Larey Seat, MD  [facility] [address] [place], [place] [address]  Phone [phone] Fax [phone]

## 2017-05-05 DIAGNOSIS — M1712 Unilateral primary osteoarthritis, left knee: Secondary | ICD-10-CM | POA: Diagnosis not present

## 2017-05-10 ENCOUNTER — Telehealth: Payer: Self-pay | Admitting: *Deleted

## 2017-05-10 MED ORDER — ZOLPIDEM TARTRATE 10 MG PO TABS: 10.0000 mg | ORAL_TABLET | Freq: Every evening | ORAL | 1 refills | Status: DC | PRN

## 2017-05-10 NOTE — Telephone Encounter (Signed)
I will give a small prescription for the Ambien, this has been given previously.  This patient is actively followed through this office.

## 2017-05-10 NOTE — Telephone Encounter (Signed)
Received fax request to send refills on zolpidem tartrate 10mg  tab. Rx last refilled 11/02/13 qty 90, 1 refills. Will send to MD for review/approval of refill

## 2017-05-11 NOTE — Telephone Encounter (Signed)
Faxed printed/signed rx to local pharmacy since qty 5, 1 refill. Fax: [phone]. Received confirmation.

## 2017-05-15 ENCOUNTER — Other Ambulatory Visit: Payer: Self-pay | Admitting: Neurology

## 2017-05-17 ENCOUNTER — Other Ambulatory Visit: Payer: Self-pay

## 2017-05-17 MED ORDER — LAMOTRIGINE 200 MG PO TABS: 200.0000 mg | ORAL_TABLET | Freq: Two times a day (BID) | ORAL | 3 refills | Status: DC

## 2017-05-27 DIAGNOSIS — R5383 Other fatigue: Secondary | ICD-10-CM | POA: Diagnosis not present

## 2017-05-27 DIAGNOSIS — I10 Essential (primary) hypertension: Secondary | ICD-10-CM | POA: Diagnosis not present

## 2017-05-27 DIAGNOSIS — Z125 Encounter for screening for malignant neoplasm of prostate: Secondary | ICD-10-CM | POA: Diagnosis not present

## 2017-05-27 DIAGNOSIS — E291 Testicular hypofunction: Secondary | ICD-10-CM | POA: Diagnosis not present

## 2017-06-03 DIAGNOSIS — R739 Hyperglycemia, unspecified: Secondary | ICD-10-CM | POA: Diagnosis not present

## 2017-06-03 DIAGNOSIS — I10 Essential (primary) hypertension: Secondary | ICD-10-CM | POA: Diagnosis not present

## 2017-06-03 DIAGNOSIS — E7800 Pure hypercholesterolemia, unspecified: Secondary | ICD-10-CM | POA: Diagnosis not present

## 2017-06-03 DIAGNOSIS — E291 Testicular hypofunction: Secondary | ICD-10-CM | POA: Diagnosis not present

## 2017-06-06 ENCOUNTER — Telehealth: Payer: Self-pay | Admitting: Neurology

## 2017-06-06 DIAGNOSIS — K648 Other hemorrhoids: Secondary | ICD-10-CM | POA: Diagnosis not present

## 2017-06-06 NOTE — Telephone Encounter (Signed)
I received blood work results done by the primary care physician on 05/27/2017. White blood count was 5.8, hemoglobin 13.3, hematocrit of 40.8, platelet a 187, MCV of 94.4. Glucose 105, BUN 24, creatinine of 1.5, estimated GFR of 50.1. Sodium 141, potassium 4.7, right 14, CO2 25, calcium 8.8, total protein 6.9, albumin of 3.9, alkaline phosphatase of 64, AST 12, CO2 20. PSA of 2.8, TSH of 0.8, hemoglobin A1c of 6.2, acetylcholine receptor antibody level, binding, blocking, and modulating were all negative.

## 2017-06-14 DIAGNOSIS — M1712 Unilateral primary osteoarthritis, left knee: Secondary | ICD-10-CM | POA: Diagnosis not present

## 2017-06-19 IMAGING — CR DG CHEST 2V
2 series · 2 of 2 positions shown · non-contrast
Comparison: Chest x-ray of May 17, 2014

CLINICAL DATA: History of renal malignancy

EXAM:
CHEST  2 VIEW

[w chest pa]
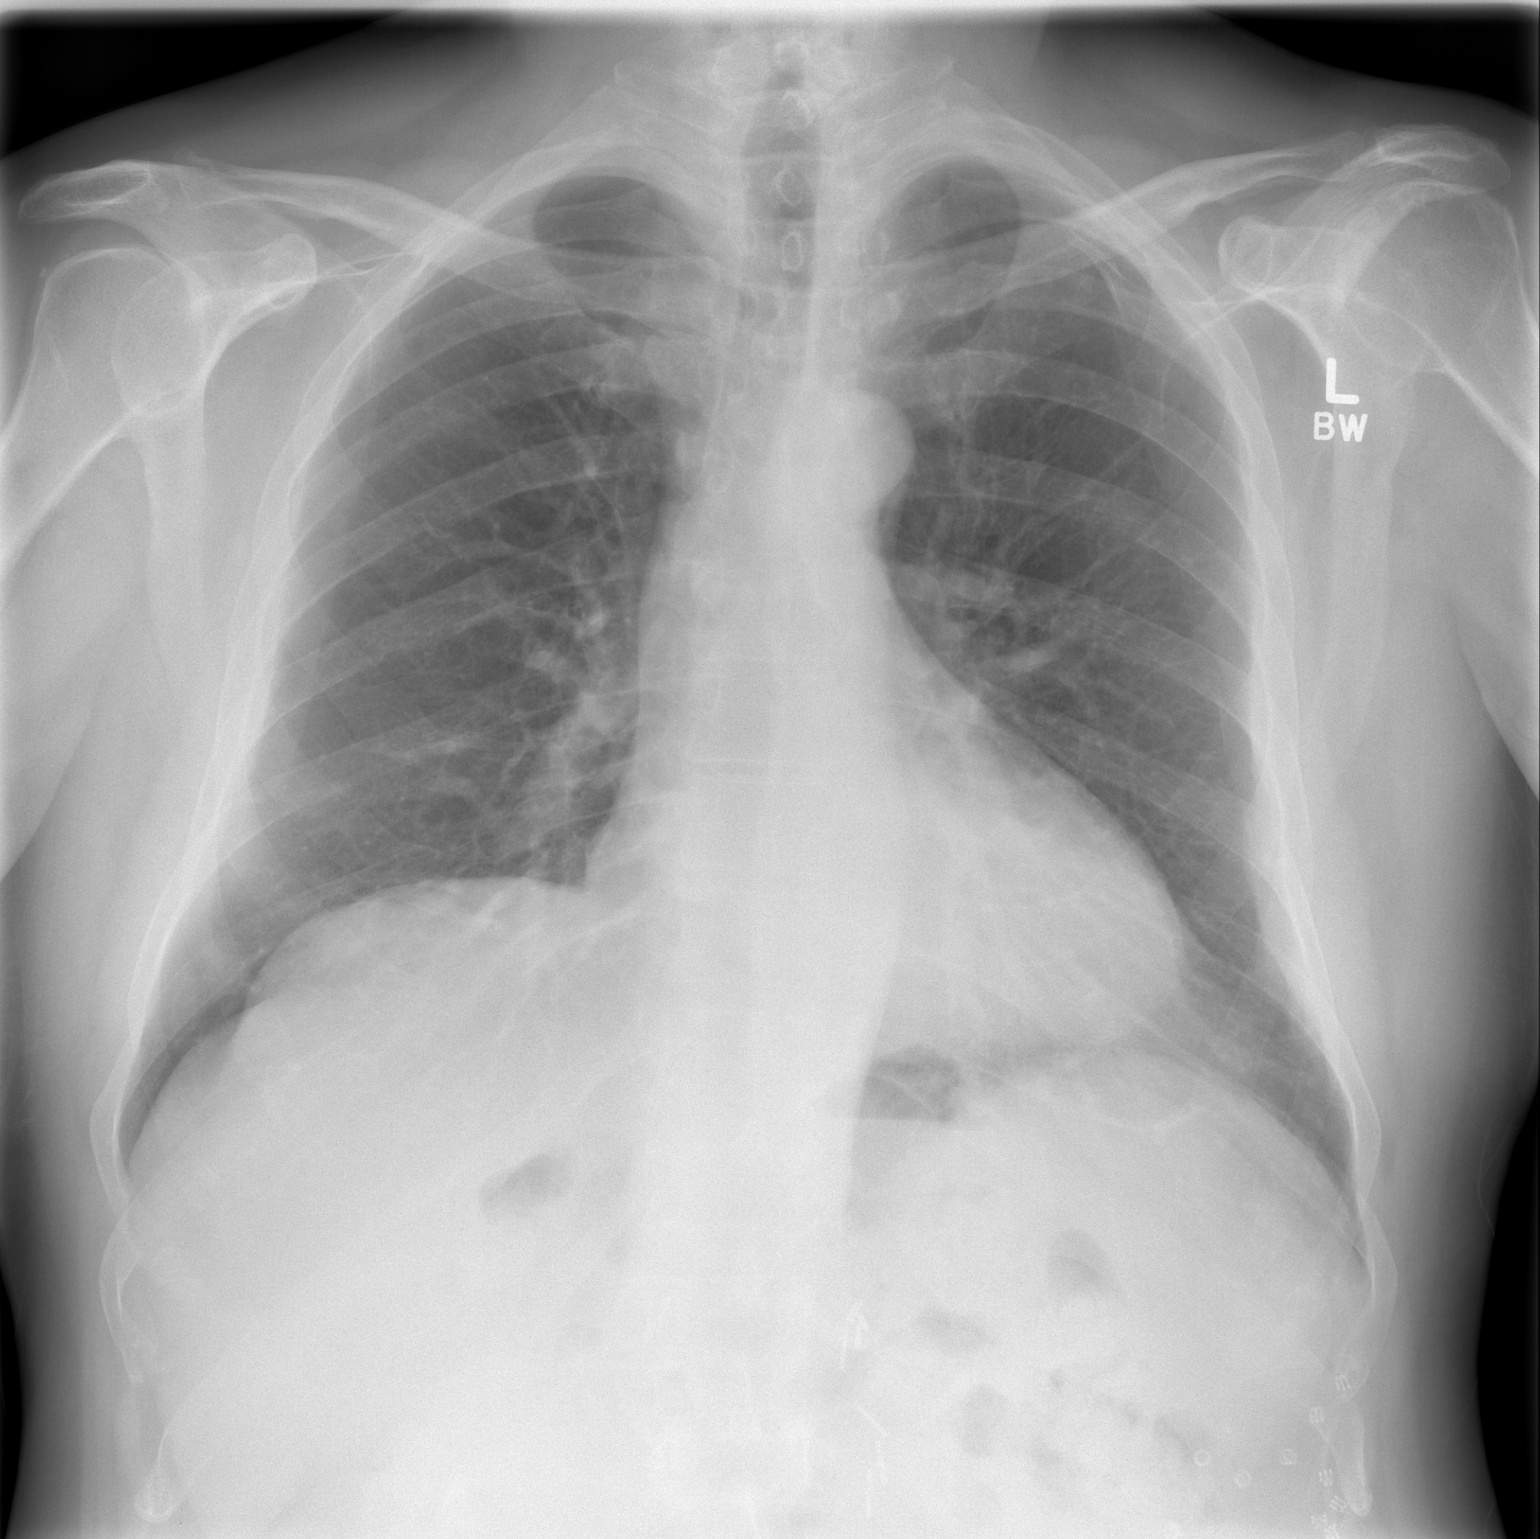

[w chest lat]
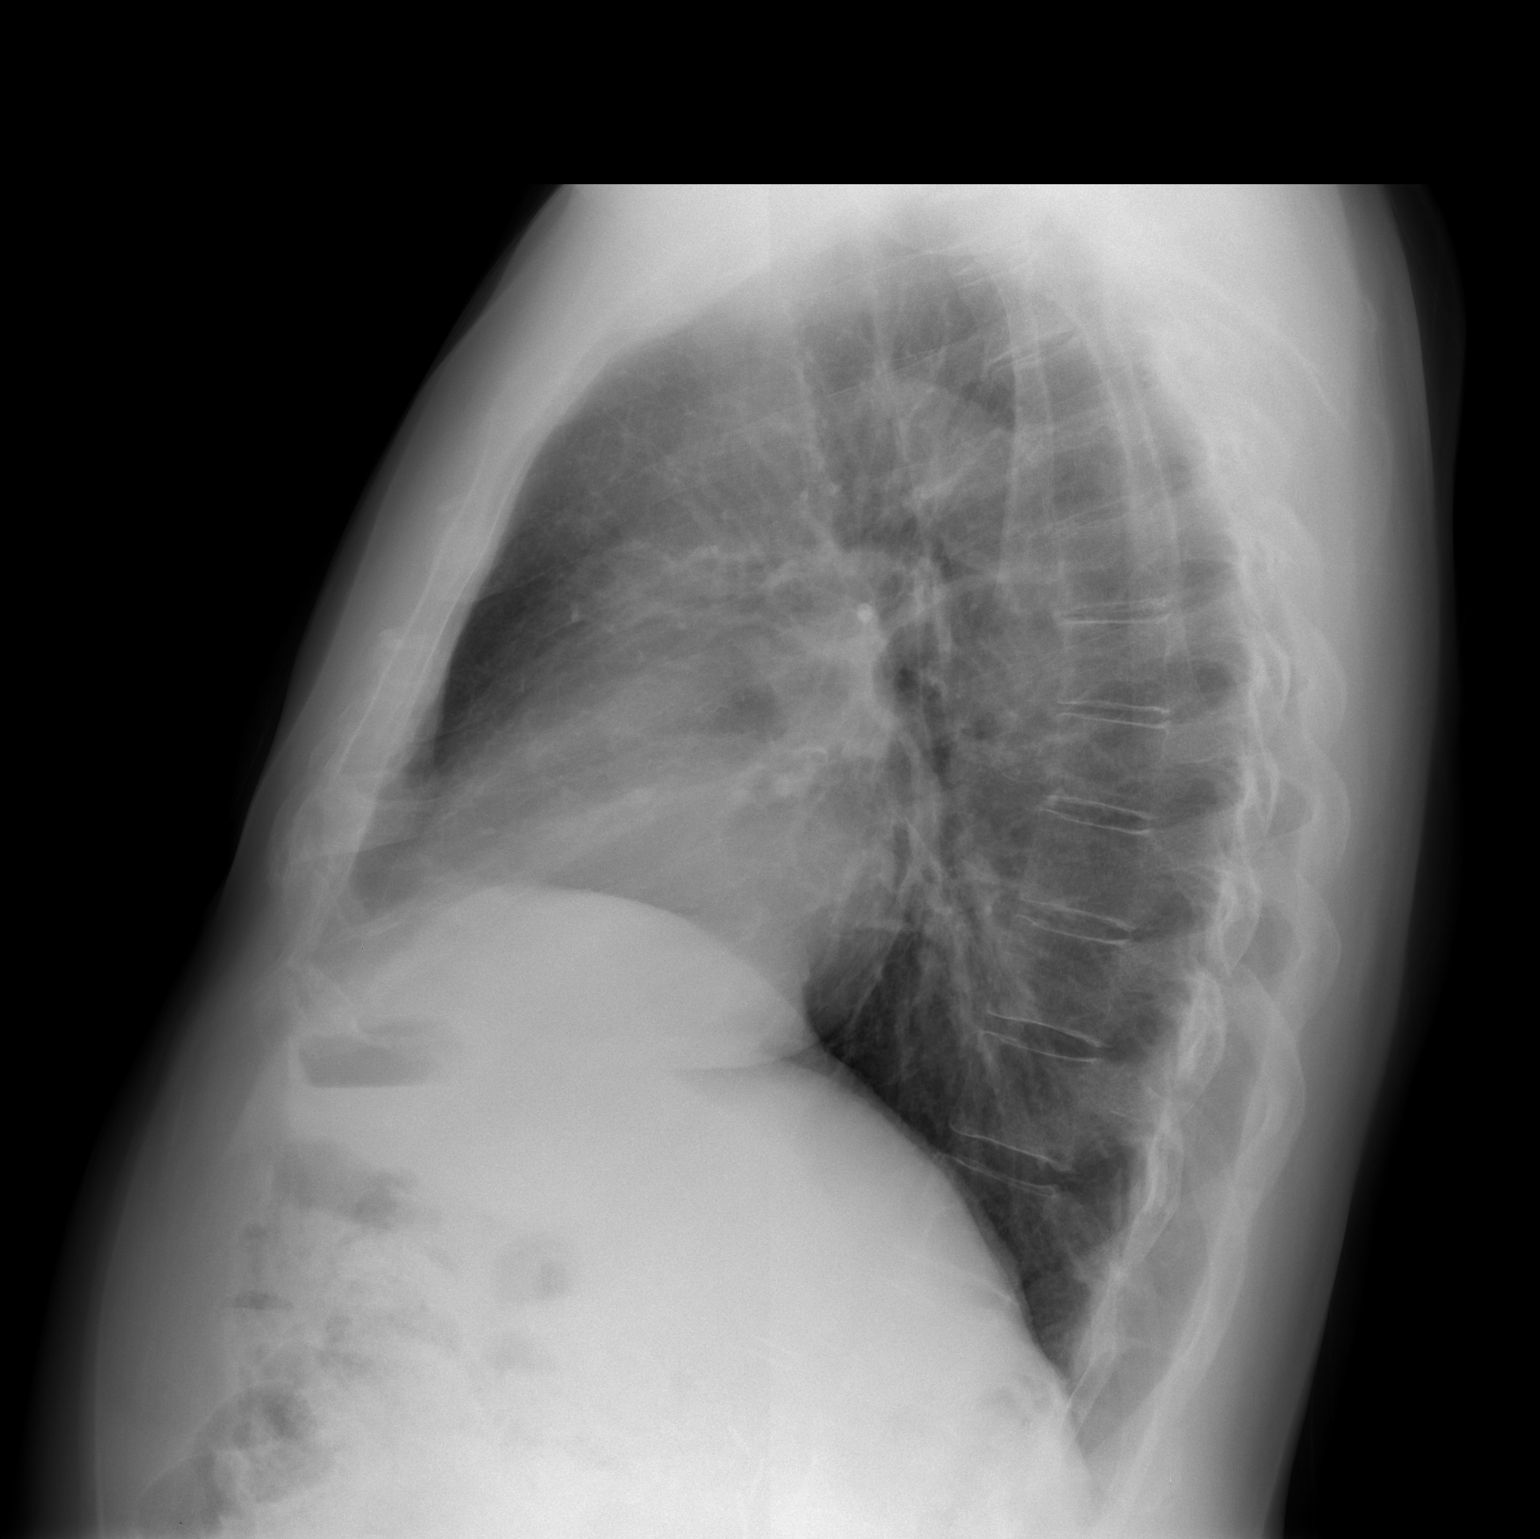

[2 of 2 positions shown; findings below may reference images not displayed]

FINDINGS: The lungs are adequately inflated and clear. No abnormal parenchymal
nodules are observed. The heart and pulmonary vascularity are
normal. The mediastinum is normal in width. There is no pleural
effusion. The bony thorax is unremarkable.
IMPRESSION: There is no evidence of metastatic disease nor other active
cardiopulmonary disease.

## 2017-06-23 ENCOUNTER — Ambulatory Visit: Payer: Self-pay | Admitting: Orthopedic Surgery

## 2017-06-28 ENCOUNTER — Ambulatory Visit: Payer: Self-pay | Admitting: Orthopedic Surgery

## 2017-06-28 NOTE — H&P (Signed)
Kenneth Ortega DOB: 17-Jan-1951 Married / Language: Kenneth Ortega / Race: White Male Date of admission: July 18, 2017  Chief complaint: Left knee pain History of Present Illness  The patient is a 66 year old male who comes in for a preoperative History and Physical. The patient is scheduled for a left total knee arthroplasty to be performed by Dr. Dione Ortega. Aluisio, MD at [facility] on 07/18/2017. The patient reports left knee symptoms including: pain which began 10 year(s) ago without any known injury.The patient feels that the symptoms are worsening. Prior to being seen the patient was previously evaluated by me (in 2013). Previous work-up for this problem has included arthroscopy (for torn cartilage many years ago by Dr. Percell Ortega). Symptoms are reported to be located in the left knee and include knee pain (with standing or sitting for long periods), decreased range of motion (trouble with full extension) and instability, while symptoms reported today do not include swelling. Kenneth Ortega has had long-term problems with this LEFT knee. It has been going on for at least 10 years. Although initially intermittent in nature and the pain is more constant now. It is occurring throughout the knee. He does not get swelling. The knee occasionally feels it wants to give out on him. It has not locked up on him. He has had a stager is having a more difficult time doing things that he desires. He is not interested in a short-term solution as injection in the past has not helped much. He does state he is ready to go ahead and get this fixed because he is tired of dealing with the pain and dysfunction. They have been treated conservatively in the past for the above stated problem and despite conservative measures, they continue to have progressive pain and severe functional limitations and dysfunction. They have failed non-operative management including home exercise, medications. It is felt that they would  benefit from undergoing total joint replacement. Risks and benefits of the procedure have been discussed with the patient and they elect to proceed with surgery. There are no active contraindications to surgery such as ongoing infection or rapidly progressive neurological disease.  Past Medical Seizure Disorder  Absence Seizures Hypercholesterolemia  High blood pressure  Cancer  Other disease, cancer, significant illness  KIDNEY CANCER Depression  Sleep Apnea  uses CPAP Primary osteoarthritis of left knee (M17.12)  Left Rotator Cuff Tear  Past History of Chronic Prostatitis  Kidney Stone    Allergies Percocet *ANALGESICS - OPIOID*  hallucinations  Family History Congestive Heart Failure  mother Diabetes Mellitus  sister and grandmother mothers side Drug / Alcohol Addiction  brother Hypertension  mother, sister and brother Osteoarthritis  mother and brother Depression  First Degree Relatives. father and sister  Social History Alcohol use  current drinker; drinks beer and wine; only occasionally per week Children  0 Current work status  retired Engineer, agricultural (Currently)  no Exercise  Exercises daily; does running / walking and gym / [employer] Illicit drug use  no Living situation  live with spouse Marital status  married Number of flights of stairs before winded  greater than 5 Pain Contract  no Previously in rehab  no Tobacco use  Never smoker. never smoker  Medication History  Zolpidem Tartrate (10MG  Tablet, Oral) Active. Tamsulosin HCl (0.4MG  Capsule, Oral) Active. LamoTRIgine (200MG  Tablet, Oral) Active. Benicar (20MG  Tablet, Oral) Active. Nasonex (50MCG/ACT Suspension, Nasal) Active. FLUoxetine HCl (20MG  Capsule, Oral) Active. Simvastatin (20MG  Tablet, Oral) Active. Fish Oil Concentrate (1 Oral)  Specific strength unknown - Active.  Past Surgical History Arthroscopy of Knee  left Colon Polyp Removal - Colonoscopy   Kidney Removal  left Tonsillectomy    Review of Systems General Not Present- Chills, Fatigue, Fever, Memory Loss, Night Sweats, Weight Gain and Weight Loss. Skin Not Present- Eczema, Hives, Itching, Lesions and Rash. HEENT Not Present- Dentures, Double Vision, Headache, Hearing Loss, Tinnitus and Visual Loss. Respiratory Not Present- Allergies, Chronic Cough, Coughing up blood, Shortness of breath at rest and Shortness of breath with exertion. Cardiovascular Not Present- Chest Pain, Difficulty Breathing Lying Down, Murmur, Palpitations, Racing/skipping heartbeats and Swelling. Gastrointestinal Not Present- Abdominal Pain, Bloody Stool, Constipation, Diarrhea, Difficulty Swallowing, Heartburn, Jaundice, Loss of appetitie, Nausea and Vomiting. Male Genitourinary Not Present- Blood in Urine, Discharge, Flank Pain, Incontinence, Painful Urination, Urgency, Urinary frequency, Urinary Retention, Urinating at Night and Weak urinary stream. Musculoskeletal Present- Joint Pain. Not Present- Back Pain, Joint Swelling, Morning Stiffness, Muscle Pain, Muscle Weakness and Spasms. Neurological Not Present- Blackout spells, Difficulty with balance, Dizziness, Paralysis, Tremor and Weakness. Psychiatric Not Present- Insomnia.  Vitals Weight: 189 lb Height: 70.5in Weight was reported by patient. Height was reported by patient. Body Surface Area: 2.05 m Body Mass Index: 26.74 kg/m  Pulse: 60 (Regular)  BP: 126/68 (Sitting, Right Arm, Standard)    Physical Exam The physical exam findings are as follows: Note:Patient is a 66 year old male accompanied by wife Kenneth Ortega.  General Mental Status -Alert, cooperative and good historian. General Appearance-pleasant, Not in acute distress. Orientation-Oriented X3. Build & Nutrition-Well nourished and Well developed.  Head and Neck Head-normocephalic, atraumatic . Neck Global Assessment - supple, no bruit auscultated on the right, no  bruit auscultated on the left.  Eye Vision-Wears corrective lenses. Pupil - Bilateral-Regular and Round. Motion - Bilateral-EOMI.  Chest and Lung Exam Auscultation Breath sounds - clear at anterior chest wall and clear at posterior chest wall. Adventitious sounds - No Adventitious sounds.  Cardiovascular Auscultation Rhythm - Regular rate and rhythm. Heart Sounds - S1 WNL and S2 WNL. Murmurs & Other Heart Sounds - Auscultation of the heart reveals - No Murmurs.  Abdomen Palpation/Percussion Tenderness - Abdomen is non-tender to palpation. Rigidity (guarding) - Abdomen is soft. Auscultation Auscultation of the abdomen reveals - Bowel sounds normal.  Male Genitourinary Note: Not done, not pertinent to present illness   Musculoskeletal Note: Evaluation of the left hip shows flexion to 120 rotation in 30 out 40 and abduction 40 without discomfort. There is no tenderness over the greater trochanter. There is no pain on provocative testing of the hip.. Examination of the right hip shows flexion to 120 rotation in 30 abduction 40 and external rotation of 40. There is no tenderness over the greater trochanter. There is no pain on provocative testing of the hip.Exam of his RIGHT knee shows no effusion. His range of motion is 0-135. No crepitus on range of motion. Is no tenderness or instability. LEFT knee slight varus deformity range 5-125 moderate crepitus on range of motion with tenderness medial greater than lateral no instability noted. Pulse sensation motor intact distally. He has an antalgic gait pattern on the LEFT.  Radiographs AP both knees lateral showed he has bone-on-bone arthritis medial and patellofemoral compartments of the LEFT knee with slight varus deformity. RIGHT knee has minimal change.   Assessment & Plan  Primary osteoarthritis of left knee (M17.12)  Note:Surgical Plans: Left Total Knee Replacement  Disposition: Home, straight to outpatient at [facility] Friday  Sept 28th  PCP: Dr.  [name] Nuero: Dr. Jannifer Franklin  Topical TXA - Renal Cancer  Anesthesia Issues: none  Patient was instructed on what medications to stop prior to surgery.  Signed electronically by Joelene Millin, III PA-C

## 2017-07-07 ENCOUNTER — Other Ambulatory Visit (HOSPITAL_COMMUNITY): Payer: Self-pay | Admitting: Emergency Medicine

## 2017-07-07 NOTE — Patient Instructions (Addendum)
Kenneth Ortega  07/07/2017   Your procedure is scheduled on: 07-18-17  Report to [facility] Main  Entrance   Report to admitting at [facility]   Call this number if you have problems the morning of surgery \ [phone]   Remember: ONLY 1 PERSON MAY GO WITH YOU TO SHORT STAY TO GET  READY MORNING OF YOUR SURGERY.  Do not eat food or drink liquids :After Midnight.     Bring CPAP mask and tubing with you to the hospital. The device  Will be furnished for you!   Take these medicines the morning of surgery with A SIP OF WATER: fluoxetine(prozac), simvastatin(zocor), tamsulosin(flomax), lamotrigine(lamictal)                                 You may not have any metal on your body including hair pins and              piercings  Do not wear jewelry, make-up, lotions, powders or perfumes, deodorant                          Men may shave face and neck.   Do not bring valuables to the hospital. [place].  Contacts, dentures or bridgework may not be worn into surgery.  Leave suitcase in the car. After surgery it may be brought to your room.                Please read over the following fact sheets you were given: _____________________________________________________________________            [facility] - Preparing for Surgery Before surgery, you can play an important role.  Because skin is not sterile, your skin needs to be as free of germs as possible.  You can reduce the number of germs on your skin by washing with CHG (chlorahexidine gluconate) soap before surgery.  CHG is an antiseptic cleaner which kills germs and bonds with the skin to continue killing germs even after washing. Please DO NOT use if you have an allergy to CHG or antibacterial soaps.  If your skin becomes reddened/irritated stop using the CHG and inform your nurse when you arrive at Short Stay. Do not shave (including legs and underarms) for at  least 48 hours prior to the first CHG shower.  You may shave your face/neck. Please follow these instructions carefully:  1.  Shower with CHG Soap the night before surgery and the  morning of Surgery.  2.  If you choose to wash your hair, wash your hair first as usual with your  normal  shampoo.  3.  After you shampoo, rinse your hair and body thoroughly to remove the  shampoo.                           4.  Use CHG as you would any other liquid soap.  You can apply chg directly  to the skin and wash                       Gently with a scrungie or clean washcloth.  5.  Apply the CHG  Soap to your body ONLY FROM THE NECK DOWN.   Do not use on face/ open                           Wound or open sores. Avoid contact with eyes, ears mouth and genitals (private parts).                       Wash face,  Genitals (private parts) with your normal soap.             6.  Wash thoroughly, paying special attention to the area where your surgery  will be performed.  7.  Thoroughly rinse your body with warm water from the neck down.  8.  DO NOT shower/wash with your normal soap after using and rinsing off  the CHG Soap.                9.  Pat yourself dry with a clean towel.            10.  Wear clean pajamas.            11.  Place clean sheets on your bed the night of your first shower and do not  sleep with pets. Day of Surgery : Do not apply any lotions/deodorants the morning of surgery.  Please wear clean clothes to the hospital/surgery center.  FAILURE TO FOLLOW THESE INSTRUCTIONS MAY RESULT IN THE CANCELLATION OF YOUR SURGERY PATIENT SIGNATURE_________________________________  NURSE SIGNATURE__________________________________  ________________________________________________________________________   Adam Phenix  An incentive spirometer is a tool that can help keep your lungs clear and active. This tool measures how well you are filling your lungs with each breath. Taking long deep breaths  may help reverse or decrease the chance of developing breathing (pulmonary) problems (especially infection) following:  A long period of time when you are unable to move or be active. BEFORE THE PROCEDURE   If the spirometer includes an indicator to show your best effort, your nurse or respiratory therapist will set it to a desired goal.  If possible, sit up straight or lean slightly forward. Try not to slouch.  Hold the incentive spirometer in an upright position. INSTRUCTIONS FOR USE  1. Sit on the edge of your bed if possible, or sit up as far as you can in bed or on a chair. 2. Hold the incentive spirometer in an upright position. 3. Breathe out normally. 4. Place the mouthpiece in your mouth and seal your lips tightly around it. 5. Breathe in slowly and as deeply as possible, raising the piston or the ball toward the top of the column. 6. Hold your breath for 3-5 seconds or for as long as possible. Allow the piston or ball to fall to the bottom of the column. 7. Remove the mouthpiece from your mouth and breathe out normally. 8. Rest for a few seconds and repeat Steps 1 through 7 at least 10 times every 1-2 hours when you are awake. Take your time and take a few normal breaths between deep breaths. 9. The spirometer may include an indicator to show your best effort. Use the indicator as a goal to work toward during each repetition. 10. After each set of 10 deep breaths, practice coughing to be sure your lungs are clear. If you have an incision (the cut made at the time of surgery), support your incision when coughing by placing a pillow or rolled up towels  firmly against it. Once you are able to get out of bed, walk around indoors and cough well. You may stop using the incentive spirometer when instructed by your caregiver.  RISKS AND COMPLICATIONS  Take your time so you do not get dizzy or light-headed.  If you are in pain, you may need to take or ask for pain medication before doing  incentive spirometry. It is harder to take a deep breath if you are having pain. AFTER USE  Rest and breathe slowly and easily.  It can be helpful to keep track of a log of your progress. Your caregiver can provide you with a simple table to help with this. If you are using the spirometer at home, follow these instructions: [place] IF:   You are having difficultly using the spirometer.  You have trouble using the spirometer as often as instructed.  Your pain medication is not giving enough relief while using the spirometer.  You develop fever of 100.5 F (38.1 C) or higher. SEEK IMMEDIATE MEDICAL CARE IF:   You cough up bloody sputum that had not been present before.  You develop fever of 102 F (38.9 C) or greater.  You develop worsening pain at or near the incision site. MAKE SURE YOU:   Understand these instructions.  Will watch your condition.  Will get help right away if you are not doing well or get worse. Document Released: 02/21/2007 Document Revised: 01/03/2012 Document Reviewed: 04/24/2007 Exit[employer] Patient Information 2014 ExitCare, [place].   ________________________________________________________________________  WHAT IS A BLOOD TRANSFUSION? Blood Transfusion Information  A transfusion is the replacement of blood or some of its parts. Blood is made up of multiple cells which provide different functions.  Red blood cells carry oxygen and are used for blood loss replacement.  White blood cells fight against infection.  Platelets control bleeding.  Plasma helps clot blood.  Other blood products are available for specialized needs, such as hemophilia or other clotting disorders. BEFORE THE TRANSFUSION  Who gives blood for transfusions?   Healthy volunteers who are fully evaluated to make sure their blood is safe. This is blood bank blood. Transfusion therapy is the safest it has ever been in the practice of medicine. Before blood is taken from a  donor, a complete history is taken to make sure that person has no history of diseases nor engages in risky social behavior (examples are intravenous drug use or sexual activity with multiple partners). The donor's travel history is screened to minimize risk of transmitting infections, such as malaria. The donated blood is tested for signs of infectious diseases, such as HIV and hepatitis. The blood is then tested to be sure it is compatible with you in order to minimize the chance of a transfusion reaction. If you or a relative donates blood, this is often done in anticipation of surgery and is not appropriate for emergency situations. It takes many days to process the donated blood. RISKS AND COMPLICATIONS Although transfusion therapy is very safe and saves many lives, the main dangers of transfusion include:   Getting an infectious disease.  Developing a transfusion reaction. This is an allergic reaction to something in the blood you were given. Every precaution is taken to prevent this. The decision to have a blood transfusion has been considered carefully by your caregiver before blood is given. Blood is not given unless the benefits outweigh the risks. AFTER THE TRANSFUSION  Right after receiving a blood transfusion, you will usually feel much better  and more energetic. This is especially true if your red blood cells have gotten low (anemic). The transfusion raises the level of the red blood cells which carry oxygen, and this usually causes an energy increase.  The nurse administering the transfusion will monitor you carefully for complications. HOME CARE INSTRUCTIONS  No special instructions are needed after a transfusion. You may find your energy is better. Speak with your caregiver about any limitations on activity for underlying diseases you may have. SEEK MEDICAL CARE IF:   Your condition is not improving after your transfusion.  You develop redness or irritation at the intravenous (IV)  site. SEEK IMMEDIATE MEDICAL CARE IF:  Any of the following symptoms occur over the next 12 hours:  Shaking chills.  You have a temperature by mouth above 102 F (38.9 C), not controlled by medicine.  Chest, back, or muscle pain.  People around you feel you are not acting correctly or are confused.  Shortness of breath or difficulty breathing.  Dizziness and fainting.  You get a rash or develop hives.  You have a decrease in urine output.  Your urine turns a dark color or changes to pink, red, or brown. Any of the following symptoms occur over the next 10 days:  You have a temperature by mouth above 102 F (38.9 C), not controlled by medicine.  Shortness of breath.  Weakness after normal activity.  The white part of the eye turns yellow (jaundice).  You have a decrease in the amount of urine or are urinating less often.  Your urine turns a dark color or changes to pink, red, or brown. Document Released: 10/08/2000 Document Revised: 01/03/2012 Document Reviewed: 05/27/2008 [facility] Patient Information 2014 [place], [place].  _______________________________________________________________________

## 2017-07-07 NOTE — Progress Notes (Signed)
LOV neurology Dr Jannifer Franklin epic 11-29-16 epic  CXR 12-06-16 epic

## 2017-07-11 ENCOUNTER — Encounter (HOSPITAL_COMMUNITY): Payer: Self-pay

## 2017-07-11 ENCOUNTER — Encounter (HOSPITAL_COMMUNITY)
Admission: RE | Admit: 2017-07-11 | Discharge: 2017-07-11 | Disposition: A | Discharge status: Home / Self Care | Payer: Medicare Other | Source: Ambulatory Visit | Attending: Orthopedic Surgery | Admitting: Orthopedic Surgery

## 2017-07-11 DIAGNOSIS — R001 Bradycardia, unspecified: Secondary | ICD-10-CM | POA: Insufficient documentation

## 2017-07-11 DIAGNOSIS — M1712 Unilateral primary osteoarthritis, left knee: Secondary | ICD-10-CM | POA: Insufficient documentation

## 2017-07-11 DIAGNOSIS — Z01812 Encounter for preprocedural laboratory examination: Secondary | ICD-10-CM | POA: Insufficient documentation

## 2017-07-11 DIAGNOSIS — I10 Essential (primary) hypertension: Secondary | ICD-10-CM | POA: Diagnosis not present

## 2017-07-11 DIAGNOSIS — Z01810 Encounter for preprocedural cardiovascular examination: Secondary | ICD-10-CM | POA: Insufficient documentation

## 2017-07-11 LAB — PROTIME-INR
INR: 0.99
Prothrombin Time: 13 seconds (ref 11.4–15.2)

## 2017-07-11 LAB — SURGICAL PCR SCREEN
MRSA, PCR: NEGATIVE
Staphylococcus aureus: NEGATIVE

## 2017-07-11 LAB — COMPREHENSIVE METABOLIC PANEL
ALT: 16 U/L — ABNORMAL LOW (ref 17–63)
ANION GAP: 8 (ref 5–15)
AST: 17 U/L (ref 15–41)
Albumin: 4.6 g/dL (ref 3.5–5.0)
Alkaline Phosphatase: 60 U/L (ref 38–126)
BUN: 20 mg/dL (ref 6–20)
CHLORIDE: 105 mmol/L (ref 101–111)
CO2: 26 mmol/L (ref 22–32)
Calcium: 9.4 mg/dL (ref 8.9–10.3)
Creatinine, Ser: 1.31 mg/dL — ABNORMAL HIGH (ref 0.61–1.24)
GFR calc non Af Amer: 54 mL/min — ABNORMAL LOW (ref >60)
Glucose, Bld: 108 mg/dL — ABNORMAL HIGH (ref 65–99)
Potassium: 4.2 mmol/L (ref 3.5–5.1)
SODIUM: 139 mmol/L (ref 135–145)
Total Bilirubin: 0.4 mg/dL (ref 0.3–1.2)
Total Protein: 7.2 g/dL (ref 6.5–8.1)

## 2017-07-11 LAB — CBC
HEMATOCRIT: 40.2 % (ref 39.0–52.0)
HEMOGLOBIN: 13.3 g/dL (ref 13.0–17.0)
MCH: 30.6 pg (ref 26.0–34.0)
MCHC: 33.1 g/dL (ref 30.0–36.0)
MCV: 92.6 fL (ref 78.0–100.0)
PLATELETS: 192 10*3/uL (ref 150–400)
RBC: 4.34 MIL/uL (ref 4.22–5.81)
RDW: 12 % (ref 11.5–15.5)
WBC: 5.2 10*3/uL (ref 4.0–10.5)

## 2017-07-11 LAB — APTT: aPTT: 30 seconds (ref 24–36)

## 2017-07-11 NOTE — Progress Notes (Signed)
Clearance Dr [name] on chart from [facility] orthopedics

## 2017-07-18 ENCOUNTER — Inpatient Hospital Stay (HOSPITAL_COMMUNITY): Payer: Medicare Other | Admitting: Anesthesiology

## 2017-07-18 ENCOUNTER — Encounter (HOSPITAL_COMMUNITY)
Admission: RE | Disposition: A | Discharge status: Home / Self Care | Payer: Self-pay | Source: Ambulatory Visit | Attending: Orthopedic Surgery

## 2017-07-18 ENCOUNTER — Encounter (HOSPITAL_COMMUNITY): Payer: Self-pay | Admitting: *Deleted

## 2017-07-18 ENCOUNTER — Inpatient Hospital Stay (HOSPITAL_COMMUNITY)
Admission: RE | Admit: 2017-07-18 | Discharge: 2017-07-20 | DRG: 470 | Disposition: A | Discharge status: Home / Self Care | Payer: Medicare Other | Source: Ambulatory Visit | Attending: Orthopedic Surgery | Admitting: Orthopedic Surgery

## 2017-07-18 DIAGNOSIS — N189 Chronic kidney disease, unspecified: Secondary | ICD-10-CM | POA: Diagnosis present

## 2017-07-18 DIAGNOSIS — Z85528 Personal history of other malignant neoplasm of kidney: Secondary | ICD-10-CM | POA: Diagnosis not present

## 2017-07-18 DIAGNOSIS — Z85820 Personal history of malignant melanoma of skin: Secondary | ICD-10-CM

## 2017-07-18 DIAGNOSIS — M75102 Unspecified rotator cuff tear or rupture of left shoulder, not specified as traumatic: Secondary | ICD-10-CM | POA: Diagnosis not present

## 2017-07-18 DIAGNOSIS — Z885 Allergy status to narcotic agent status: Secondary | ICD-10-CM | POA: Diagnosis not present

## 2017-07-18 DIAGNOSIS — I10 Essential (primary) hypertension: Secondary | ICD-10-CM | POA: Diagnosis not present

## 2017-07-18 DIAGNOSIS — D649 Anemia, unspecified: Secondary | ICD-10-CM | POA: Diagnosis present

## 2017-07-18 DIAGNOSIS — M1712 Unilateral primary osteoarthritis, left knee: Principal | ICD-10-CM | POA: Diagnosis present

## 2017-07-18 DIAGNOSIS — E785 Hyperlipidemia, unspecified: Secondary | ICD-10-CM | POA: Diagnosis present

## 2017-07-18 DIAGNOSIS — G4733 Obstructive sleep apnea (adult) (pediatric): Secondary | ICD-10-CM | POA: Diagnosis present

## 2017-07-18 DIAGNOSIS — Z888 Allergy status to other drugs, medicaments and biological substances status: Secondary | ICD-10-CM

## 2017-07-18 DIAGNOSIS — I129 Hypertensive chronic kidney disease with stage 1 through stage 4 chronic kidney disease, or unspecified chronic kidney disease: Secondary | ICD-10-CM | POA: Diagnosis present

## 2017-07-18 DIAGNOSIS — M1710 Unilateral primary osteoarthritis, unspecified knee: Secondary | ICD-10-CM | POA: Diagnosis present

## 2017-07-18 DIAGNOSIS — G8918 Other acute postprocedural pain: Secondary | ICD-10-CM | POA: Diagnosis not present

## 2017-07-18 DIAGNOSIS — Z9989 Dependence on other enabling machines and devices: Secondary | ICD-10-CM | POA: Diagnosis not present

## 2017-07-18 DIAGNOSIS — G40909 Epilepsy, unspecified, not intractable, without status epilepticus: Secondary | ICD-10-CM | POA: Diagnosis present

## 2017-07-18 DIAGNOSIS — M179 Osteoarthritis of knee, unspecified: Secondary | ICD-10-CM | POA: Diagnosis present

## 2017-07-18 DIAGNOSIS — F329 Major depressive disorder, single episode, unspecified: Secondary | ICD-10-CM | POA: Diagnosis present

## 2017-07-18 DIAGNOSIS — Z905 Acquired absence of kidney: Secondary | ICD-10-CM

## 2017-07-18 HISTORY — PX: TOTAL KNEE ARTHROPLASTY: SHX125

## 2017-07-18 LAB — TYPE AND SCREEN
ABO/RH(D): A POS
ANTIBODY SCREEN: NEGATIVE

## 2017-07-18 SURGERY — ARTHROPLASTY, KNEE, TOTAL
Anesthesia: Spinal | Site: Knee | Laterality: Left

## 2017-07-18 MED ORDER — ONDANSETRON HCL 4 MG/2ML IJ SOLN
INTRAMUSCULAR | Status: AC
  Filled 2017-07-18: qty 2

## 2017-07-18 MED ORDER — SODIUM CHLORIDE 0.9 % IJ SOLN
INTRAMUSCULAR | Status: AC
  Filled 2017-07-18: qty 50

## 2017-07-18 MED ORDER — MORPHINE SULFATE (PF) 4 MG/ML IV SOLN
1.0000 mg | INTRAVENOUS | Status: DC | PRN
  Administered 2017-07-18 – 2017-07-19 (×4): 1 mg via INTRAVENOUS
  Filled 2017-07-18 (×4): qty 1

## 2017-07-18 MED ORDER — ONDANSETRON HCL 4 MG/2ML IJ SOLN: 4.0000 mg | Freq: Four times a day (QID) | INTRAMUSCULAR | Status: DC | PRN

## 2017-07-18 MED ORDER — GABAPENTIN 300 MG PO CAPS
300.0000 mg | ORAL_CAPSULE | Freq: Once | ORAL | Status: AC
  Administered 2017-07-18: 300 mg via ORAL

## 2017-07-18 MED ORDER — FENTANYL CITRATE (PF) 100 MCG/2ML IJ SOLN
100.0000 ug | Freq: Once | INTRAMUSCULAR | Status: AC
  Administered 2017-07-18: 50 ug via INTRAVENOUS

## 2017-07-18 MED ORDER — FLUOXETINE HCL 20 MG PO CAPS
20.0000 mg | ORAL_CAPSULE | Freq: Every day | ORAL | Status: DC
  Administered 2017-07-19 – 2017-07-20 (×2): 20 mg via ORAL
  Filled 2017-07-18 (×2): qty 1

## 2017-07-18 MED ORDER — METOCLOPRAMIDE HCL 5 MG/ML IJ SOLN: 5.0000 mg | Freq: Three times a day (TID) | INTRAMUSCULAR | Status: DC | PRN

## 2017-07-18 MED ORDER — METHOCARBAMOL 1000 MG/10ML IJ SOLN
500.0000 mg | Freq: Four times a day (QID) | INTRAVENOUS | Status: DC | PRN
  Filled 2017-07-18: qty 5

## 2017-07-18 MED ORDER — PHENYLEPHRINE HCL 10 MG/ML IJ SOLN
INTRAMUSCULAR | Status: AC
  Filled 2017-07-18: qty 1

## 2017-07-18 MED ORDER — PHENOL 1.4 % MT LIQD: 1.0000 | OROMUCOSAL | Status: DC | PRN

## 2017-07-18 MED ORDER — DOCUSATE SODIUM 100 MG PO CAPS
100.0000 mg | ORAL_CAPSULE | Freq: Two times a day (BID) | ORAL | Status: DC
  Administered 2017-07-18 – 2017-07-20 (×4): 100 mg via ORAL
  Filled 2017-07-18 (×4): qty 1

## 2017-07-18 MED ORDER — PROPOFOL 10 MG/ML IV BOLUS
INTRAVENOUS | Status: AC
  Filled 2017-07-18: qty 60

## 2017-07-18 MED ORDER — TRANEXAMIC ACID 1000 MG/10ML IV SOLN
INTRAVENOUS | Status: AC | PRN
  Administered 2017-07-18: 2000 mg via TOPICAL

## 2017-07-18 MED ORDER — CEFAZOLIN SODIUM-DEXTROSE 2-4 GM/100ML-% IV SOLN
2.0000 g | Freq: Four times a day (QID) | INTRAVENOUS | Status: AC
  Administered 2017-07-18 (×2): 2 g via INTRAVENOUS
  Filled 2017-07-18 (×2): qty 100

## 2017-07-18 MED ORDER — DEXAMETHASONE SODIUM PHOSPHATE 10 MG/ML IJ SOLN
10.0000 mg | Freq: Once | INTRAMUSCULAR | Status: AC
  Administered 2017-07-18: 10 mg via INTRAVENOUS

## 2017-07-18 MED ORDER — FLEET ENEMA 7-19 GM/118ML RE ENEM: 1.0000 | ENEMA | Freq: Once | RECTAL | Status: DC | PRN

## 2017-07-18 MED ORDER — DIPHENHYDRAMINE HCL 12.5 MG/5ML PO ELIX: 12.5000 mg | ORAL_SOLUTION | ORAL | Status: DC | PRN

## 2017-07-18 MED ORDER — BISACODYL 10 MG RE SUPP: 10.0000 mg | Freq: Every day | RECTAL | Status: DC | PRN

## 2017-07-18 MED ORDER — TAMSULOSIN HCL 0.4 MG PO CAPS
0.4000 mg | ORAL_CAPSULE | Freq: Every day | ORAL | Status: DC
  Administered 2017-07-19 – 2017-07-20 (×2): 0.4 mg via ORAL
  Filled 2017-07-18 (×3): qty 1

## 2017-07-18 MED ORDER — ONDANSETRON HCL 4 MG/2ML IJ SOLN: 4.0000 mg | Freq: Once | INTRAMUSCULAR | Status: DC | PRN

## 2017-07-18 MED ORDER — POLYETHYLENE GLYCOL 3350 17 G PO PACK
17.0000 g | PACK | Freq: Every day | ORAL | Status: DC | PRN
Start: 2017-07-18 — End: 2017-07-20

## 2017-07-18 MED ORDER — SODIUM CHLORIDE 0.9 % IJ SOLN
INTRAMUSCULAR | Status: DC | PRN
  Administered 2017-07-18: 60 mL

## 2017-07-18 MED ORDER — ACETAMINOPHEN 650 MG RE SUPP: 650.0000 mg | Freq: Four times a day (QID) | RECTAL | Status: DC | PRN

## 2017-07-18 MED ORDER — PHENYLEPHRINE 40 MCG/ML (10ML) SYRINGE FOR IV PUSH (FOR BLOOD PRESSURE SUPPORT)
PREFILLED_SYRINGE | INTRAVENOUS | Status: DC | PRN
  Administered 2017-07-18 (×2): 80 ug via INTRAVENOUS

## 2017-07-18 MED ORDER — MIDAZOLAM HCL 2 MG/2ML IJ SOLN
INTRAMUSCULAR | Status: AC
  Administered 2017-07-18: 2 mg via INTRAVENOUS
  Filled 2017-07-18: qty 2

## 2017-07-18 MED ORDER — TRANEXAMIC ACID 1000 MG/10ML IV SOLN
2000.0000 mg | Freq: Once | INTRAVENOUS | Status: DC
  Filled 2017-07-18: qty 20

## 2017-07-18 MED ORDER — EPHEDRINE SULFATE-NACL 50-0.9 MG/10ML-% IV SOSY
PREFILLED_SYRINGE | INTRAVENOUS | Status: DC | PRN
  Administered 2017-07-18: 10 mg via INTRAVENOUS

## 2017-07-18 MED ORDER — DEXAMETHASONE SODIUM PHOSPHATE 10 MG/ML IJ SOLN
10.0000 mg | Freq: Once | INTRAMUSCULAR | Status: AC
  Administered 2017-07-19: 10:00:00 10 mg via INTRAVENOUS
  Filled 2017-07-18: qty 1

## 2017-07-18 MED ORDER — ONDANSETRON HCL 4 MG PO TABS: 4.0000 mg | ORAL_TABLET | Freq: Four times a day (QID) | ORAL | Status: DC | PRN

## 2017-07-18 MED ORDER — PROPOFOL 10 MG/ML IV BOLUS
INTRAVENOUS | Status: AC
  Filled 2017-07-18: qty 20

## 2017-07-18 MED ORDER — BUPIVACAINE LIPOSOME 1.3 % IJ SUSP
20.0000 mL | Freq: Once | INTRAMUSCULAR | Status: DC
  Filled 2017-07-18: qty 20

## 2017-07-18 MED ORDER — TRANEXAMIC ACID 1000 MG/10ML IV SOLN
1000.0000 mg | Freq: Once | INTRAVENOUS | Status: AC
  Administered 2017-07-18: 1000 mg via INTRAVENOUS
  Filled 2017-07-18: qty 1100

## 2017-07-18 MED ORDER — ACETAMINOPHEN 325 MG PO TABS
650.0000 mg | ORAL_TABLET | Freq: Four times a day (QID) | ORAL | Status: DC | PRN
Start: 2017-07-19 — End: 2017-07-20
  Administered 2017-07-20: 650 mg via ORAL
  Filled 2017-07-18 (×2): qty 2

## 2017-07-18 MED ORDER — PHENYLEPHRINE 40 MCG/ML (10ML) SYRINGE FOR IV PUSH (FOR BLOOD PRESSURE SUPPORT)
PREFILLED_SYRINGE | INTRAVENOUS | Status: AC
  Filled 2017-07-18: qty 10

## 2017-07-18 MED ORDER — RIVAROXABAN 10 MG PO TABS
10.0000 mg | ORAL_TABLET | Freq: Every day | ORAL | Status: DC
  Administered 2017-07-19 – 2017-07-20 (×2): 10 mg via ORAL
  Filled 2017-07-18 (×2): qty 1

## 2017-07-18 MED ORDER — PROPOFOL 10 MG/ML IV BOLUS
INTRAVENOUS | Status: DC | PRN
  Administered 2017-07-18: 50 mg via INTRAVENOUS

## 2017-07-18 MED ORDER — BUPIVACAINE IN DEXTROSE 0.75-8.25 % IT SOLN
INTRATHECAL | Status: DC | PRN
  Administered 2017-07-18: 1.8 mL via INTRATHECAL

## 2017-07-18 MED ORDER — DEXAMETHASONE SODIUM PHOSPHATE 10 MG/ML IJ SOLN
INTRAMUSCULAR | Status: AC
  Filled 2017-07-18: qty 1

## 2017-07-18 MED ORDER — CHLORHEXIDINE GLUCONATE 4 % EX LIQD: 60.0000 mL | Freq: Once | CUTANEOUS | Status: DC

## 2017-07-18 MED ORDER — GABAPENTIN 300 MG PO CAPS
ORAL_CAPSULE | ORAL | Status: AC
  Administered 2017-07-18: 300 mg via ORAL
  Filled 2017-07-18: qty 1

## 2017-07-18 MED ORDER — EPHEDRINE 5 MG/ML INJ
INTRAVENOUS | Status: AC
  Filled 2017-07-18: qty 10

## 2017-07-18 MED ORDER — SODIUM CHLORIDE 0.9 % IJ SOLN
INTRAMUSCULAR | Status: AC
  Filled 2017-07-18: qty 10

## 2017-07-18 MED ORDER — FLUTICASONE PROPIONATE 50 MCG/ACT NA SUSP: 2.0000 | Freq: Every day | NASAL | Status: DC | PRN

## 2017-07-18 MED ORDER — SODIUM CHLORIDE 0.9 % IV SOLN
INTRAVENOUS | Status: DC
  Administered 2017-07-18 – 2017-07-19 (×2): via INTRAVENOUS

## 2017-07-18 MED ORDER — FENTANYL CITRATE (PF) 100 MCG/2ML IJ SOLN
25.0000 ug | INTRAMUSCULAR | Status: DC | PRN
Start: 2017-07-18 — End: 2017-07-18

## 2017-07-18 MED ORDER — SODIUM CHLORIDE 0.9 % IR SOLN
Status: DC | PRN
  Administered 2017-07-18: 1000 mL

## 2017-07-18 MED ORDER — CEFAZOLIN SODIUM-DEXTROSE 2-4 GM/100ML-% IV SOLN
2.0000 g | INTRAVENOUS | Status: AC
  Administered 2017-07-18: 2 g via INTRAVENOUS

## 2017-07-18 MED ORDER — BUPIVACAINE-EPINEPHRINE (PF) 0.5% -1:200000 IJ SOLN
INTRAMUSCULAR | Status: DC | PRN
  Administered 2017-07-18: 30 mL via PERINEURAL

## 2017-07-18 MED ORDER — SIMVASTATIN 20 MG PO TABS
20.0000 mg | ORAL_TABLET | Freq: Every day | ORAL | Status: DC
Start: 2017-07-19 — End: 2017-07-20
  Administered 2017-07-19 – 2017-07-20 (×2): 20 mg via ORAL
  Filled 2017-07-18 (×4): qty 1

## 2017-07-18 MED ORDER — LAMOTRIGINE 100 MG PO TABS
200.0000 mg | ORAL_TABLET | Freq: Two times a day (BID) | ORAL | Status: DC
  Administered 2017-07-18 – 2017-07-20 (×4): 200 mg via ORAL
  Filled 2017-07-18 (×4): qty 2

## 2017-07-18 MED ORDER — ONDANSETRON HCL 4 MG/2ML IJ SOLN
INTRAMUSCULAR | Status: DC | PRN
  Administered 2017-07-18: 4 mg via INTRAVENOUS

## 2017-07-18 MED ORDER — ACETAMINOPHEN 500 MG PO TABS
1000.0000 mg | ORAL_TABLET | Freq: Four times a day (QID) | ORAL | Status: AC
  Administered 2017-07-18 – 2017-07-19 (×4): 1000 mg via ORAL
  Filled 2017-07-18 (×4): qty 2

## 2017-07-18 MED ORDER — BUPIVACAINE LIPOSOME 1.3 % IJ SUSP
INTRAMUSCULAR | Status: DC | PRN
  Administered 2017-07-18: 20 mL

## 2017-07-18 MED ORDER — HYDROMORPHONE HCL 2 MG PO TABS
2.0000 mg | ORAL_TABLET | ORAL | Status: DC | PRN
  Administered 2017-07-18: 17:00:00 2 mg via ORAL
  Administered 2017-07-18 – 2017-07-19 (×6): 4 mg via ORAL
  Administered 2017-07-20: 2 mg via ORAL
  Administered 2017-07-20 (×2): 4 mg via ORAL
  Administered 2017-07-20: 2 mg via ORAL
  Filled 2017-07-18: qty 1
  Filled 2017-07-18 (×2): qty 2
  Filled 2017-07-18: qty 1
  Filled 2017-07-18 (×7): qty 2

## 2017-07-18 MED ORDER — LACTATED RINGERS IV SOLN
INTRAVENOUS | Status: DC
  Administered 2017-07-18 (×3): via INTRAVENOUS

## 2017-07-18 MED ORDER — IRBESARTAN 75 MG PO TABS
37.5000 mg | ORAL_TABLET | Freq: Every day | ORAL | Status: DC
Start: 2017-07-19 — End: 2017-07-20
  Administered 2017-07-20: 09:00:00 37.5 mg via ORAL
  Filled 2017-07-18 (×2): qty 1

## 2017-07-18 MED ORDER — ACETAMINOPHEN 10 MG/ML IV SOLN
1000.0000 mg | Freq: Once | INTRAVENOUS | Status: AC
  Administered 2017-07-18: 1000 mg via INTRAVENOUS

## 2017-07-18 MED ORDER — MENTHOL 3 MG MT LOZG: 1.0000 | LOZENGE | OROMUCOSAL | Status: DC | PRN

## 2017-07-18 MED ORDER — FENTANYL CITRATE (PF) 100 MCG/2ML IJ SOLN
INTRAMUSCULAR | Status: AC
  Administered 2017-07-18: 50 ug via INTRAVENOUS
  Filled 2017-07-18: qty 2

## 2017-07-18 MED ORDER — ZOLPIDEM TARTRATE 5 MG PO TABS: 5.0000 mg | ORAL_TABLET | Freq: Every evening | ORAL | Status: DC | PRN

## 2017-07-18 MED ORDER — METOCLOPRAMIDE HCL 5 MG PO TABS: 5.0000 mg | ORAL_TABLET | Freq: Three times a day (TID) | ORAL | Status: DC | PRN

## 2017-07-18 MED ORDER — CEFAZOLIN SODIUM-DEXTROSE 2-4 GM/100ML-% IV SOLN
INTRAVENOUS | Status: AC
  Filled 2017-07-18: qty 100

## 2017-07-18 MED ORDER — PROPOFOL 500 MG/50ML IV EMUL
INTRAVENOUS | Status: DC | PRN
  Administered 2017-07-18: 150 ug/kg/min via INTRAVENOUS

## 2017-07-18 MED ORDER — METHOCARBAMOL 500 MG PO TABS
500.0000 mg | ORAL_TABLET | Freq: Four times a day (QID) | ORAL | Status: DC | PRN
  Administered 2017-07-18: 500 mg via ORAL
  Filled 2017-07-18: qty 1

## 2017-07-18 MED ORDER — ACETAMINOPHEN 10 MG/ML IV SOLN
INTRAVENOUS | Status: AC
  Filled 2017-07-18: qty 100

## 2017-07-18 MED ORDER — PHENYLEPHRINE HCL 10 MG/ML IJ SOLN
INTRAMUSCULAR | Status: DC | PRN
  Administered 2017-07-18: 50 ug/min via INTRAVENOUS

## 2017-07-18 MED ORDER — MIDAZOLAM HCL 2 MG/2ML IJ SOLN
2.0000 mg | Freq: Once | INTRAMUSCULAR | Status: AC
  Administered 2017-07-18: 2 mg via INTRAVENOUS

## 2017-07-18 SURGICAL SUPPLY — 51 items
BAG DECANTER FOR FLEXI CONT (MISCELLANEOUS) ×3 IMPLANT
BAG SPEC THK2 15X12 ZIP CLS (MISCELLANEOUS) ×1
BAG ZIPLOCK 12X15 (MISCELLANEOUS) ×3 IMPLANT
BANDAGE ACE 6X5 VEL STRL LF (GAUZE/BANDAGES/DRESSINGS) ×3 IMPLANT
BLADE SAG 18X100X1.27 (BLADE) ×3 IMPLANT
BLADE SAW SGTL 11.0X1.19X90.0M (BLADE) ×3 IMPLANT
BOWL SMART MIX CTS (DISPOSABLE) ×3 IMPLANT
CAPT KNEE TOTAL 3 ATTUNE ×2 IMPLANT
CEMENT HV SMART SET (Cement) ×6 IMPLANT
CLOSURE WOUND 1/2 X4 (GAUZE/BANDAGES/DRESSINGS) ×1
COVER SURGICAL LIGHT HANDLE (MISCELLANEOUS) ×3 IMPLANT
CUFF TOURN SGL QUICK 34 (TOURNIQUET CUFF) ×3
CUFF TRNQT CYL 34X4X40X1 (TOURNIQUET CUFF) ×1 IMPLANT
DECANTER SPIKE VIAL GLASS SM (MISCELLANEOUS) ×3 IMPLANT
DRAPE U-SHAPE 47X51 STRL (DRAPES) ×3 IMPLANT
DRSG ADAPTIC 3X8 NADH LF (GAUZE/BANDAGES/DRESSINGS) ×3 IMPLANT
DRSG PAD ABDOMINAL 8X10 ST (GAUZE/BANDAGES/DRESSINGS) ×3 IMPLANT
DURAPREP 26ML APPLICATOR (WOUND CARE) ×3 IMPLANT
ELECT REM PT RETURN 15FT ADLT (MISCELLANEOUS) ×3 IMPLANT
EVACUATOR 1/8 PVC DRAIN (DRAIN) ×3 IMPLANT
GAUZE SPONGE 4X4 12PLY STRL (GAUZE/BANDAGES/DRESSINGS) ×3 IMPLANT
GLOVE BIO SURGEON STRL SZ7.5 (GLOVE) IMPLANT
GLOVE BIO SURGEON STRL SZ8 (GLOVE) ×3 IMPLANT
GLOVE BIOGEL PI IND STRL 6.5 (GLOVE) IMPLANT
GLOVE BIOGEL PI IND STRL 8 (GLOVE) ×1 IMPLANT
GLOVE BIOGEL PI INDICATOR 6.5 (GLOVE)
GLOVE BIOGEL PI INDICATOR 8 (GLOVE) ×2
GLOVE SURG SS PI 6.5 STRL IVOR (GLOVE) IMPLANT
GOWN STRL REUS W/TWL LRG LVL3 (GOWN DISPOSABLE) ×3 IMPLANT
GOWN STRL REUS W/TWL XL LVL3 (GOWN DISPOSABLE) IMPLANT
HANDPIECE INTERPULSE COAX TIP (DISPOSABLE) ×3
IMMOBILIZER KNEE 20 (SOFTGOODS) ×3
IMMOBILIZER KNEE 20 THIGH 36 (SOFTGOODS) ×1 IMPLANT
MANIFOLD NEPTUNE II (INSTRUMENTS) ×3 IMPLANT
NS IRRIG 1000ML POUR BTL (IV SOLUTION) ×3 IMPLANT
PACK TOTAL KNEE CUSTOM (KITS) ×3 IMPLANT
PAD ABD 8X10 STRL (GAUZE/BANDAGES/DRESSINGS) ×2 IMPLANT
PADDING CAST COTTON 6X4 STRL (CAST SUPPLIES) ×7 IMPLANT
POSITIONER SURGICAL ARM (MISCELLANEOUS) ×3 IMPLANT
SET HNDPC FAN SPRY TIP SCT (DISPOSABLE) ×1 IMPLANT
STRIP CLOSURE SKIN 1/2X4 (GAUZE/BANDAGES/DRESSINGS) ×3 IMPLANT
SUT MNCRL AB 4-0 PS2 18 (SUTURE) ×3 IMPLANT
SUT STRATAFIX 0 PDS 27 VIOLET (SUTURE) ×3
SUT VIC AB 2-0 CT1 27 (SUTURE) ×9
SUT VIC AB 2-0 CT1 TAPERPNT 27 (SUTURE) ×3 IMPLANT
SUTURE STRATFX 0 PDS 27 VIOLET (SUTURE) ×1 IMPLANT
SYR 30ML LL (SYRINGE) ×6 IMPLANT
TRAY FOLEY W/METER SILVER 16FR (SET/KITS/TRAYS/PACK) ×3 IMPLANT
WATER STERILE IRR 1000ML POUR (IV SOLUTION) ×6 IMPLANT
WRAP KNEE MAXI GEL POST OP (GAUZE/BANDAGES/DRESSINGS) ×3 IMPLANT
YANKAUER SUCT BULB TIP 10FT TU (MISCELLANEOUS) ×3 IMPLANT

## 2017-07-18 NOTE — Progress Notes (Signed)
Pt has declined the use of CPAP tonight but states that if he changes his mind he will notify RT.  RT to monitor and assess as needed.

## 2017-07-18 NOTE — Transfer of Care (Signed)
Immediate Anesthesia Transfer of Care Note  Patient: Kenneth Ortega  Procedure(s) Performed: Procedure(s): LEFT TOTAL KNEE ARTHROPLASTY (Left)  Patient Location: PACU  Anesthesia Type:spinal  Level of Consciousness:  sedated, patient cooperative and responds to stimulation  Airway & Oxygen Therapy:Patient Spontanous Breathing and Patient connected to face mask oxgen  Post-op Assessment:  Report given to PACU RN and Post -op Vital signs reviewed and stable  Post vital signs:  Reviewed and stable  Last Vitals:  Vitals:   07/18/17 1038 07/18/17 1039  BP:    Pulse: 67 67  Resp: (!) 23 15  SpO2: 46% 19%    Complications: No apparent anesthesia complications

## 2017-07-18 NOTE — Anesthesia Preprocedure Evaluation (Addendum)
Anesthesia Evaluation  Patient identified by MRN, date of birth, ID band Patient awake    Reviewed: Allergy & Precautions, NPO status , Patient's Chart, lab work & pertinent test results  Airway Mallampati: III  TM Distance: >3 FB Neck ROM: Full    Dental  (+) Dental Advisory Given   Pulmonary sleep apnea and Continuous Positive Airway Pressure Ventilation ,    Pulmonary exam normal breath sounds clear to auscultation       Cardiovascular hypertension, Normal cardiovascular exam Rhythm:Regular Rate:Normal     Neuro/Psych  Headaches, Seizures -,  PSYCHIATRIC DISORDERS Depression    GI/Hepatic negative GI ROS, Neg liver ROS,   Endo/Other  negative endocrine ROS  Renal/GU CRFRenal diseaseRCC of left kidney  negative genitourinary   Musculoskeletal  (+) Arthritis ,   Abdominal   Peds  Hematology negative hematology ROS (+) anemia ,   Anesthesia Other Findings Hx melanoma  Reproductive/Obstetrics                            Anesthesia Physical Anesthesia Plan  ASA: III  Anesthesia Plan: Spinal   Post-op Pain Management:  Regional for Post-op pain   Induction:   PONV Risk Score and Plan: Treatment may vary due to age or medical condition and Propofol infusion  Airway Management Planned: Natural Airway and Nasal Cannula  Additional Equipment: None  Intra-op Plan:   Post-operative Plan:   Informed Consent: I have reviewed the patients History and Physical, chart, labs and discussed the procedure including the risks, benefits and alternatives for the proposed anesthesia with the patient or authorized representative who has indicated his/her understanding and acceptance.   Dental advisory given  Plan Discussed with: CRNA  Anesthesia Plan Comments:        Anesthesia Quick Evaluation

## 2017-07-18 NOTE — Anesthesia Postprocedure Evaluation (Signed)
Anesthesia Post Note  Patient: Kenneth Ortega  Procedure(s) Performed: Procedure(s) (LRB): LEFT TOTAL KNEE ARTHROPLASTY (Left)     Patient location during evaluation: PACU Anesthesia Type: Spinal Level of consciousness: awake and alert Pain management: pain level controlled Vital Signs Assessment: post-procedure vital signs reviewed and stable Respiratory status: spontaneous breathing and respiratory function stable Cardiovascular status: blood pressure returned to baseline and stable Postop Assessment: spinal receding and no apparent nausea or vomiting Anesthetic complications: no    Last Vitals:  Vitals:   07/18/17 1315 07/18/17 1345  BP: 113/76 124/74  Pulse: (!) 55 (!) 54  Resp: 14 14  Temp:  (!) 36.4 C  SpO2: 98% 96%    Last Pain:  Vitals:   07/18/17 1345  TempSrc: Oral                 Audry Pili

## 2017-07-18 NOTE — Progress Notes (Signed)
PT Cancellation Note  Patient Details Name: TORAN MURCH MRN: [id number] DOB: Oct 26, 1950   Cancelled Treatment:    Reason Eval/Treat Not Completed: Medical issues which prohibited therapy (spinal not yet worn off. Will follow. )   Philomena Doheny 07/18/2017, 4:10 PM [phone]

## 2017-07-18 NOTE — Anesthesia Procedure Notes (Signed)
Spinal  Patient location during procedure: OR Start time: 07/18/2017 11:03 AM Staffing Anesthesiologist: Audry Pili Resident/CRNA: [place] Tracy Gerken C Performed: resident/CRNA  Preanesthetic Checklist Completed: patient identified, site marked, surgical consent, pre-op evaluation, timeout performed, IV checked, risks and benefits discussed and monitors and equipment checked Spinal Block Patient position: sitting Prep: Betadine Patient monitoring: heart rate, continuous pulse ox and blood pressure Approach: midline Location: L3-4 Injection technique: single-shot Needle Needle type: Pencan  Needle gauge: 24 G Needle length: 9 cm Assessment Sensory level: T6 Additional Notes Expiration date of kit checked and confirmed. Patient tolerated procedure well, without complications.

## 2017-07-18 NOTE — Interval H&P Note (Signed)
History and Physical Interval Note:  07/18/2017 9:10 AM  Kenneth Ortega  has presented today for surgery, with the diagnosis of Osteoarthritis Left Knee  The various methods of treatment have been discussed with the patient and family. After consideration of risks, benefits and other options for treatment, the patient has consented to  Procedure(s): LEFT TOTAL KNEE ARTHROPLASTY (Left) as a surgical intervention .  The patient's history has been reviewed, patient examined, no change in status, stable for surgery.  I have reviewed the patient's chart and labs.  Questions were answered to the patient's satisfaction.     [name]

## 2017-07-18 NOTE — Anesthesia Procedure Notes (Signed)
Anesthesia Regional Block: Adductor canal block   Pre-Anesthetic Checklist: ,, timeout performed, Correct Patient, Correct Site, Correct Laterality, Correct Procedure, Correct Position, site marked, Risks and benefits discussed,  Surgical consent,  Pre-op evaluation,  At surgeon's request and post-op pain management  Laterality: Left  Prep: chloraprep       Needles:  Injection technique: Single-shot  Needle Type: Echogenic Needle     Needle Length: 9cm  Needle Gauge: 21     Additional Needles:   Narrative:  Start time: 07/18/2017 10:33 AM End time: 07/18/2017 10:36 AM Injection made incrementally with aspirations every 5 mL.  Performed by: Personally  Anesthesiologist: Renold Don E  Additional Notes: No pain on injection. No increased resistance to injection. Injection made in 5cc increments. Good needle visualization. Patient tolerated the procedure well.

## 2017-07-18 NOTE — Progress Notes (Signed)
AssistedDr. Brock with left, ultrasound guided, adductor canal block. Side rails up, monitors on throughout procedure. See vital signs in flow sheet. Tolerated Procedure well.  

## 2017-07-18 NOTE — H&P (View-Only) (Signed)
Kenneth Ortega DOB: 01-22-1951 Married / Language: Kenneth Ortega / Race: White Male Date of admission: July 18, 2017  Chief complaint: Left knee pain History of Present Illness  The patient is a 66 year old male who comes in for a preoperative History and Physical. The patient is scheduled for a left total knee arthroplasty to be performed by Dr. Dione Plover. Aluisio, MD at [facility] on 07/18/2017. The patient reports left knee symptoms including: pain which began 10 year(s) ago without any known injury.The patient feels that the symptoms are worsening. Prior to being seen the patient was previously evaluated by me (in 2013). Previous work-up for this problem has included arthroscopy (for torn cartilage many years ago by Dr. Percell Miller). Symptoms are reported to be located in the left knee and include knee pain (with standing or sitting for long periods), decreased range of motion (trouble with full extension) and instability, while symptoms reported today do not include swelling. Kenneth Ortega has had long-term problems with this LEFT knee. It has been going on for at least 10 years. Although initially intermittent in nature and the pain is more constant now. It is occurring throughout the knee. He does not get swelling. The knee occasionally feels it wants to give out on him. It has not locked up on him. He has had a stager is having a more difficult time doing things that he desires. He is not interested in a short-term solution as injection in the past has not helped much. He does state he is ready to go ahead and get this fixed because he is tired of dealing with the pain and dysfunction. They have been treated conservatively in the past for the above stated problem and despite conservative measures, they continue to have progressive pain and severe functional limitations and dysfunction. They have failed non-operative management including home exercise, medications. It is felt that they would  benefit from undergoing total joint replacement. Risks and benefits of the procedure have been discussed with the patient and they elect to proceed with surgery. There are no active contraindications to surgery such as ongoing infection or rapidly progressive neurological disease.  Past Medical Seizure Disorder  Absence Seizures Hypercholesterolemia  High blood pressure  Cancer  Other disease, cancer, significant illness  KIDNEY CANCER Depression  Sleep Apnea  uses CPAP Primary osteoarthritis of left knee (M17.12)  Left Rotator Cuff Tear  Past History of Chronic Prostatitis  Kidney Stone    Allergies Percocet *ANALGESICS - OPIOID*  hallucinations  Family History Congestive Heart Failure  mother Diabetes Mellitus  sister and grandmother mothers side Drug / Alcohol Addiction  brother Hypertension  mother, sister and brother Osteoarthritis  mother and brother Depression  First Degree Relatives. father and sister  Social History Alcohol use  current drinker; drinks beer and wine; only occasionally per week Children  0 Current work status  retired Engineer, agricultural (Currently)  no Exercise  Exercises daily; does running / walking and gym / [employer] Illicit drug use  no Living situation  live with spouse Marital status  married Number of flights of stairs before winded  greater than 5 Pain Contract  no Previously in rehab  no Tobacco use  Never smoker. never smoker  Medication History  Zolpidem Tartrate (10MG  Tablet, Oral) Active. Tamsulosin HCl (0.4MG  Capsule, Oral) Active. LamoTRIgine (200MG  Tablet, Oral) Active. Benicar (20MG  Tablet, Oral) Active. Nasonex (50MCG/ACT Suspension, Nasal) Active. FLUoxetine HCl (20MG  Capsule, Oral) Active. Simvastatin (20MG  Tablet, Oral) Active. Fish Oil Concentrate (1 Oral)  Specific strength unknown - Active.  Past Surgical History Arthroscopy of Knee  left Colon Polyp Removal - Colonoscopy   Kidney Removal  left Tonsillectomy    Review of Systems General Not Present- Chills, Fatigue, Fever, Memory Loss, Night Sweats, Weight Gain and Weight Loss. Skin Not Present- Eczema, Hives, Itching, Lesions and Rash. HEENT Not Present- Dentures, Double Vision, Headache, Hearing Loss, Tinnitus and Visual Loss. Respiratory Not Present- Allergies, Chronic Cough, Coughing up blood, Shortness of breath at rest and Shortness of breath with exertion. Cardiovascular Not Present- Chest Pain, Difficulty Breathing Lying Down, Murmur, Palpitations, Racing/skipping heartbeats and Swelling. Gastrointestinal Not Present- Abdominal Pain, Bloody Stool, Constipation, Diarrhea, Difficulty Swallowing, Heartburn, Jaundice, Loss of appetitie, Nausea and Vomiting. Male Genitourinary Not Present- Blood in Urine, Discharge, Flank Pain, Incontinence, Painful Urination, Urgency, Urinary frequency, Urinary Retention, Urinating at Night and Weak urinary stream. Musculoskeletal Present- Joint Pain. Not Present- Back Pain, Joint Swelling, Morning Stiffness, Muscle Pain, Muscle Weakness and Spasms. Neurological Not Present- Blackout spells, Difficulty with balance, Dizziness, Paralysis, Tremor and Weakness. Psychiatric Not Present- Insomnia.  Vitals Weight: 189 lb Height: 70.5in Weight was reported by patient. Height was reported by patient. Body Surface Area: 2.05 m Body Mass Index: 26.74 kg/m  Pulse: 60 (Regular)  BP: 126/68 (Sitting, Right Arm, Standard)    Physical Exam The physical exam findings are as follows: Note:Patient is a 66 year old male accompanied by wife Kenneth Ortega.  General Mental Status -Alert, cooperative and good historian. General Appearance-pleasant, Not in acute distress. Orientation-Oriented X3. Build & Nutrition-Well nourished and Well developed.  Head and Neck Head-normocephalic, atraumatic . Neck Global Assessment - supple, no bruit auscultated on the right, no  bruit auscultated on the left.  Eye Vision-Wears corrective lenses. Pupil - Bilateral-Regular and Round. Motion - Bilateral-EOMI.  Chest and Lung Exam Auscultation Breath sounds - clear at anterior chest wall and clear at posterior chest wall. Adventitious sounds - No Adventitious sounds.  Cardiovascular Auscultation Rhythm - Regular rate and rhythm. Heart Sounds - S1 WNL and S2 WNL. Murmurs & Other Heart Sounds - Auscultation of the heart reveals - No Murmurs.  Abdomen Palpation/Percussion Tenderness - Abdomen is non-tender to palpation. Rigidity (guarding) - Abdomen is soft. Auscultation Auscultation of the abdomen reveals - Bowel sounds normal.  Male Genitourinary Note: Not done, not pertinent to present illness   Musculoskeletal Note: Evaluation of the left hip shows flexion to 120 rotation in 30 out 40 and abduction 40 without discomfort. There is no tenderness over the greater trochanter. There is no pain on provocative testing of the hip.. Examination of the right hip shows flexion to 120 rotation in 30 abduction 40 and external rotation of 40. There is no tenderness over the greater trochanter. There is no pain on provocative testing of the hip.Exam of his RIGHT knee shows no effusion. His range of motion is 0-135. No crepitus on range of motion. Is no tenderness or instability. LEFT knee slight varus deformity range 5-125 moderate crepitus on range of motion with tenderness medial greater than lateral no instability noted. Pulse sensation motor intact distally. He has an antalgic gait pattern on the LEFT.  Radiographs AP both knees lateral showed he has bone-on-bone arthritis medial and patellofemoral compartments of the LEFT knee with slight varus deformity. RIGHT knee has minimal change.   Assessment & Plan  Primary osteoarthritis of left knee (M17.12)  Note:Surgical Plans: Left Total Knee Replacement  Disposition: Home, straight to outpatient at [facility] Friday  Sept 28th  PCP: Dr.  [name] Nuero: Dr. Jannifer Franklin  Topical TXA - Renal Cancer  Anesthesia Issues: none  Patient was instructed on what medications to stop prior to surgery.  Signed electronically by Joelene Millin, III PA-C

## 2017-07-18 NOTE — Op Note (Signed)
OPERATIVE REPORT-TOTAL KNEE ARTHROPLASTY   Pre-operative diagnosis- Osteoarthritis  Left knee(s)  Post-operative diagnosis- Osteoarthritis Left knee(s)  Procedure-  Left  Total Knee Arthroplasty  Surgeon- Dione Plover. Takoda Siedlecki, MD  Assistant- Ardeen Jourdain, PA-C   Anesthesia-  Adductor canal block and spinal  EBL-* No blood loss amount entered *   Drains Hemovac  Tourniquet time-  Total Tourniquet Time Documented: Thigh (Left) - 36 minutes Total: Thigh (Left) - 36 minutes     Complications- None  Condition-PACU - hemodynamically stable.   Brief Clinical Note   Kenneth Ortega is a 66 y.o. year old male with end stage OA of his left knee with progressively worsening pain and dysfunction. He has constant pain, with activity and at rest and significant functional deficits with difficulties even with ADLs. He has had extensive non-op management including analgesics, injections of cortisone and viscosupplements, and home exercise program, but remains in significant pain with significant dysfunction. Radiographs show bone on bone arthritis medial and patellofemoral with varus deformity. He presents now for left Total Knee Arthroplasty.    Procedure in detail---   The patient is brought into the operating room and positioned supine on the operating table. After successful administration of  Adductor canal block and spinal,   a tourniquet is placed high on the  Left thigh(s) and the lower extremity is prepped and draped in the usual sterile fashion. Time out is performed by the operating team and then the  Left lower extremity is wrapped in Esmarch, knee flexed and the tourniquet inflated to 300 mmHg.       A midline incision is made with a ten blade through the subcutaneous tissue to the level of the extensor mechanism. A fresh blade is used to make a medial parapatellar arthrotomy. Soft tissue over the proximal medial tibia is subperiosteally elevated to the joint line with a knife and  into the semimembranosus bursa with a Cobb elevator. Soft tissue over the proximal lateral tibia is elevated with attention being paid to avoiding the patellar tendon on the tibial tubercle. The patella is everted, knee flexed 90 degrees and the ACL and PCL are removed. Findings are bone on bone medial and patellofemoral with large global osteophytes.        The drill is used to create a starting hole in the distal femur and the canal is thoroughly irrigated with sterile saline to remove the fatty contents. The 5 degree Left  valgus alignment guide is placed into the femoral canal and the distal femoral cutting block is pinned to remove 9 mm off the distal femur. Resection is made with an oscillating saw.      The tibia is subluxed forward and the menisci are removed. The extramedullary alignment guide is placed referencing proximally at the medial aspect of the tibial tubercle and distally along the second metatarsal axis and tibial crest. The block is pinned to remove 6mm off the more deficient medial  side. Resection is made with an oscillating saw. Size 6 is the most appropriate size for the tibia and the proximal tibia is prepared with the modular drill and keel punch for that size.      The femoral sizing guide is placed and size 7 is most appropriate. Rotation is marked off the epicondylar axis and confirmed by creating a rectangular flexion gap at 90 degrees. The size 7 cutting block is pinned in this rotation and the anterior, posterior and chamfer cuts are made with the oscillating saw. The intercondylar  block is then placed and that cut is made.      Trial size 6 tibial component, trial size 7 posterior stabilized femur and a 6  mm posterior stabilized rotating platform insert trial is placed. Full extension is achieved with excellent varus/valgus and anterior/posterior balance throughout full range of motion. The patella is everted and thickness measured to be 24  mm. Free hand resection is taken to  14 mm, a 38 template is placed, lug holes are drilled, trial patella is placed, and it tracks normally. Osteophytes are removed off the posterior femur with the trial in place. All trials are removed and the cut bone surfaces prepared with pulsatile lavage. Cement is mixed and once ready for implantation, the size 6 tibial implant, size  7 posterior stabilized femoral component, and the size 38 patella are cemented in place and the patella is held with the clamp. The trial insert is placed and the knee held in full extension. The Exparel (20 ml mixed with 60 ml saline) is injected into the extensor mechanism, posterior capsule, medial and lateral gutters and subcutaneous tissues.  All extruded cement is removed and once the cement is hard the permanent 6 mm posterior stabilized rotating platform insert is placed into the tibial tray.      The wound is copiously irrigated with saline solution and the extensor mechanism closed over a hemovac drain with #1 V-loc suture. The tourniquet is released for a total tourniquet time of 36  minutes. Flexion against gravity is 140 degrees and the patella tracks normally. Subcutaneous tissue is closed with 2.0 vicryl and subcuticular with running 4.0 Monocryl. The incision is cleaned and dried and steri-strips and a bulky sterile dressing are applied. The limb is placed into a knee immobilizer and the patient is awakened and transported to recovery in stable condition.      Please note that a surgical assistant was a medical necessity for this procedure in order to perform it in a safe and expeditious manner. Surgical assistant was necessary to retract the ligaments and vital neurovascular structures to prevent injury to them and also necessary for proper positioning of the limb to allow for anatomic placement of the prosthesis.   Dione Plover Kirtan Sada, MD    07/18/2017, 12:09 PM

## 2017-07-18 NOTE — Discharge Instructions (Addendum)
°  ° °Dr. Frank Aluisio °Total Joint Specialist °[facility] °[address] °[place], [place] [address] °[phone] ° °TOTAL KNEE REPLACEMENT POSTOPERATIVE DIRECTIONS ° °Knee Rehabilitation, Guidelines Following Surgery  °Results after knee surgery are often greatly improved when you follow the exercise, range of motion and muscle strengthening exercises prescribed by your doctor. Safety measures are also important to protect the knee from further injury. Any time any of these exercises cause you to have increased pain or swelling in your knee joint, decrease the amount until you are comfortable again and slowly increase them. If you have problems or questions, call your caregiver or physical therapist for advice.  ° °HOME CARE INSTRUCTIONS  °Remove items at home which could result in a fall. This includes throw rugs or furniture in walking pathways.  °· ICE to the affected knee every three hours for 30 minutes at a time and then as needed for pain and swelling.  Continue to use ice on the knee for pain and swelling from surgery. You may notice swelling that will progress down to the foot and ankle.  This is normal after surgery.  Elevate the leg when you are not up walking on it.   °· Continue to use the breathing machine which will help keep your temperature down.  It is common for your temperature to cycle up and down following surgery, especially at night when you are not up moving around and exerting yourself.  The breathing machine keeps your lungs expanded and your temperature down. °· Do not place pillow under knee, focus on keeping the knee straight while resting ° °DIET °You may resume your previous home diet once your are discharged from the hospital. ° °DRESSING / WOUND CARE / SHOWERING °You may shower 3 days after surgery, but keep the wounds dry during showering.  You may use an occlusive plastic wrap (Press'n Seal for example), NO SOAKING/SUBMERGING IN THE BATHTUB.  If the  bandage gets wet, change with a clean dry gauze.  If the incision gets wet, pat the wound dry with a clean towel. °You may start showering once you are discharged home but do not submerge the incision under water. Just pat the incision dry and apply a dry gauze dressing on daily. °Change the surgical dressing daily and reapply a dry dressing each time. ° °ACTIVITY °Walk with your walker as instructed. °Use walker as long as suggested by your caregivers. °Avoid periods of inactivity such as sitting longer than an hour when not asleep. This helps prevent blood clots.  °You may resume a sexual relationship in one month or when given the OK by your doctor.  °You may return to work once you are cleared by your doctor.  °Do not drive a car for 6 weeks or until released by you surgeon.  °Do not drive while taking narcotics. ° °WEIGHT BEARING °Weight bearing as tolerated with assist device (walker, cane, etc) as directed, use it as long as suggested by your surgeon or therapist, typically at least 4-6 weeks. ° °POSTOPERATIVE CONSTIPATION PROTOCOL °Constipation - defined medically as fewer than three stools per week and severe constipation as less than one stool per week. ° °One of the most common issues patients have following surgery is constipation.  Even if you have a regular bowel pattern at home, your normal regimen is likely to be disrupted due to multiple reasons following surgery.  Combination of anesthesia, postoperative narcotics, change in appetite and fluid intake all can affect your   bowels.  In order to avoid complications following surgery, here are some recommendations in order to help you during your recovery period. ° °Colace (docusate) - Pick up an over-the-counter form of Colace or another stool softener and take twice a day as long as you are requiring postoperative pain medications.  Take with a full glass of water daily.  If you experience loose stools or diarrhea, hold the colace until you stool forms  back up.  If your symptoms do not get better within 1 week or if they get worse, check with your doctor. ° °Dulcolax (bisacodyl) - Pick up over-the-counter and take as directed by the product packaging as needed to assist with the movement of your bowels.  Take with a full glass of water.  Use this product as needed if not relieved by Colace only.  ° °MiraLax (polyethylene glycol) - Pick up over-the-counter to have on hand.  MiraLax is a solution that will increase the amount of water in your bowels to assist with bowel movements.  Take as directed and can mix with a glass of water, juice, soda, coffee, or tea.  Take if you go more than two days without a movement. °Do not use MiraLax more than once per day. Call your doctor if you are still constipated or irregular after using this medication for 7 days in a row. ° °If you continue to have problems with postoperative constipation, please contact the office for further assistance and recommendations.  If you experience "the worst abdominal pain ever" or develop nausea or vomiting, please contact the office immediatly for further recommendations for treatment. ° °ITCHING ° If you experience itching with your medications, try taking only a single pain pill, or even half a pain pill at a time.  You can also use Benadryl over the counter for itching or also to help with sleep.  ° °TED HOSE STOCKINGS °Wear the elastic stockings on both legs for three weeks following surgery during the day but you may remove then at night for sleeping. ° °MEDICATIONS °See your medication summary on the “After Visit Summary” that the nursing staff will review with you prior to discharge.  You may have some home medications which will be placed on hold until you complete the course of blood thinner medication.  It is important for you to complete the blood thinner medication as prescribed by your surgeon.  Continue your approved medications as instructed at time of  discharge. ° °PRECAUTIONS °If you experience chest pain or shortness of breath - call 911 immediately for transfer to the hospital emergency department.  °If you develop a fever greater that 101 F, purulent drainage from wound, increased redness or drainage from wound, foul odor from the wound/dressing, or calf pain - CONTACT YOUR SURGEON.   °                                                °FOLLOW-UP APPOINTMENTS °Make sure you keep all of your appointments after your operation with your surgeon and caregivers. You should call the office at the above phone number and make an appointment for approximately two weeks after the date of your surgery or on the date instructed by your surgeon outlined in the "After Visit Summary". ° ° °RANGE OF MOTION AND STRENGTHENING EXERCISES  °Rehabilitation of the knee is important following a knee   injury or an operation. After just a few days of immobilization, the muscles of the thigh which control the knee become weakened and shrink (atrophy). Knee exercises are designed to build up the tone and strength of the thigh muscles and to improve knee motion. Often times heat used for twenty to thirty minutes before working out will loosen up your tissues and help with improving the range of motion but do not use heat for the first two weeks following surgery. These exercises can be done on a training (exercise) mat, on the floor, on a table or on a bed. Use what ever works the best and is most comfortable for you Knee exercises include:  °Leg Lifts - While your knee is still immobilized in a splint or cast, you can do straight leg raises. Lift the leg to 60 degrees, hold for 3 sec, and slowly lower the leg. Repeat 10-20 times 2-3 times daily. Perform this exercise against resistance later as your knee gets better.  °Quad and Hamstring Sets - Tighten up the muscle on the front of the thigh (Quad) and hold for 5-10 sec. Repeat this 10-20 times hourly. Hamstring sets are done by pushing the  foot backward against an object and holding for 5-10 sec. Repeat as with quad sets.  °· Leg Slides: Lying on your back, slowly slide your foot toward your buttocks, bending your knee up off the floor (only go as far as is comfortable). Then slowly slide your foot back down until your leg is flat on the floor again. °· Angel Wings: Lying on your back spread your legs to the side as far apart as you can without causing discomfort.  °A rehabilitation program following serious knee injuries can speed recovery and prevent re-injury in the future due to weakened muscles. Contact your doctor or a physical therapist for more information on knee rehabilitation.  ° °IF YOU ARE TRANSFERRED TO A SKILLED REHAB FACILITY °If the patient is transferred to a skilled rehab facility following release from the hospital, a list of the current medications will be sent to the facility for the patient to continue.  When discharged from the skilled rehab facility, please have the facility set up the patient's Home Health Physical Therapy prior to being released. Also, the skilled facility will be responsible for providing the patient with their medications at time of release from the facility to include their pain medication, the muscle relaxants, and their blood thinner medication. If the patient is still at the rehab facility at time of the two week follow up appointment, the skilled rehab facility will also need to assist the patient in arranging follow up appointment in our office and any transportation needs. ° °MAKE SURE YOU:  °Understand these instructions.  °Get help right away if you are not doing well or get worse.  ° ° °Pick up stool softner and laxative for home use following surgery while on pain medications. °Do not submerge incision under water. °Please use good hand washing techniques while changing dressing each day. °May shower starting three days after surgery. °Please use a clean towel to pat the incision dry following  showers. °Continue to use ice for pain and swelling after surgery. °Do not use any lotions or creams on the incision until instructed by your surgeon. ° °Take Xarelto for two and a half more weeks following discharge from the hospital, then discontinue Xarelto. °Once the patient has completed the blood thinner regimen, then take a Baby 81 mg Aspirin daily   for three more weeks. ° ° °Information on my medicine - XARELTO® (Rivaroxaban) ° ° °Why was Xarelto® prescribed for you? °Xarelto® was prescribed for you to reduce the risk of blood clots forming after orthopedic surgery. The medical term for these abnormal blood clots is venous thromboembolism (VTE). ° °What do you need to know about xarelto® ? °Take your Xarelto® ONCE DAILY at the same time every day. °You may take it either with or without food. ° °If you have difficulty swallowing the tablet whole, you may crush it and mix in applesauce just prior to taking your dose. ° °Take Xarelto® exactly as prescribed by your doctor and DO NOT stop taking Xarelto® without talking to the doctor who prescribed the medication.  Stopping without other VTE prevention medication to take the place of Xarelto® may increase your risk of developing a clot. ° °After discharge, you should have regular check-up appointments with your healthcare provider that is prescribing your Xarelto®.   ° °What do you do if you miss a dose? °If you miss a dose, take it as soon as you remember on the same day then continue your regularly scheduled once daily regimen the next day. Do not take two doses of Xarelto® on the same day.  ° °Important Safety Information °A possible side effect of Xarelto® is bleeding. You should call your healthcare provider right away if you experience any of the following: °? Bleeding from an injury or your nose that does not stop. °? Unusual colored urine (red or dark brown) or unusual colored stools (red or black). °? Unusual bruising for unknown reasons. °? A serious  fall or if you hit your head (even if there is no bleeding). ° °Some medicines may interact with Xarelto® and might increase your risk of bleeding while on Xarelto®. To help avoid this, consult your healthcare provider or pharmacist prior to using any new prescription or non-prescription medications, including herbals, vitamins, non-steroidal anti-inflammatory drugs (NSAIDs) and supplements. ° °This website has more information on Xarelto®: www.xarelto.com. ° ° °

## 2017-07-19 ENCOUNTER — Encounter (HOSPITAL_COMMUNITY): Payer: Self-pay | Admitting: Orthopedic Surgery

## 2017-07-19 LAB — CBC
HCT: 35.3 % — ABNORMAL LOW (ref 39.0–52.0)
HEMOGLOBIN: 11.9 g/dL — AB (ref 13.0–17.0)
MCH: 31.2 pg (ref 26.0–34.0)
MCHC: 33.7 g/dL (ref 30.0–36.0)
MCV: 92.7 fL (ref 78.0–100.0)
PLATELETS: 178 10*3/uL (ref 150–400)
RBC: 3.81 MIL/uL — ABNORMAL LOW (ref 4.22–5.81)
RDW: 12.2 % (ref 11.5–15.5)
WBC: 15.6 10*3/uL — ABNORMAL HIGH (ref 4.0–10.5)

## 2017-07-19 LAB — BASIC METABOLIC PANEL
Anion gap: 7 (ref 5–15)
BUN: 20 mg/dL (ref 6–20)
CALCIUM: 8.5 mg/dL — AB (ref 8.9–10.3)
CHLORIDE: 105 mmol/L (ref 101–111)
CO2: 25 mmol/L (ref 22–32)
CREATININE: 1.33 mg/dL — AB (ref 0.61–1.24)
GFR calc Af Amer: >60 mL/min (ref >60)
GFR calc non Af Amer: 53 mL/min — ABNORMAL LOW (ref >60)
Glucose, Bld: 154 mg/dL — ABNORMAL HIGH (ref 65–99)
Potassium: 4.6 mmol/L (ref 3.5–5.1)
SODIUM: 137 mmol/L (ref 135–145)

## 2017-07-19 MED ORDER — RIVAROXABAN 10 MG PO TABS: 10.0000 mg | ORAL_TABLET | Freq: Every day | ORAL | 0 refills | Status: DC

## 2017-07-19 MED ORDER — SODIUM CHLORIDE 0.9 % IV BOLUS (SEPSIS)
250.0000 mL | Freq: Once | INTRAVENOUS | Status: AC
  Administered 2017-07-19: 250 mL via INTRAVENOUS

## 2017-07-19 MED ORDER — CYCLOBENZAPRINE HCL 10 MG PO TABS: 10.0000 mg | ORAL_TABLET | Freq: Three times a day (TID) | ORAL | 0 refills | Status: DC | PRN

## 2017-07-19 MED ORDER — HYDROMORPHONE HCL 2 MG PO TABS: 2.0000 mg | ORAL_TABLET | ORAL | 0 refills | Status: DC | PRN

## 2017-07-19 MED ORDER — CYCLOBENZAPRINE HCL 10 MG PO TABS
10.0000 mg | ORAL_TABLET | Freq: Three times a day (TID) | ORAL | Status: DC | PRN
  Administered 2017-07-19 – 2017-07-20 (×3): 10 mg via ORAL
  Filled 2017-07-19 (×3): qty 1

## 2017-07-19 NOTE — Evaluation (Signed)
Physical Therapy Evaluation Patient Details Name: Kenneth Ortega MRN: [id number] DOB: October 25, 1951 Today's Date: 07/19/2017   History of Present Illness  Pt is a 66 y/o M s/p L TKA; PMHx includes Hypercholesterolemia, HTN, seizure disorder, L rotator cuff tear   Clinical Impression  Pt is s/p TKA resulting in the deficits listed below (see PT Problem List).  Pt will benefit from skilled PT to increase their independence and safety with mobility to allow discharge to the venue listed below.  Pt ambulated in hallway and performed LE exercises POD#1 and plans to d/c home with spouse and start with outpatient PT.     Follow Up Recommendations DC plan and follow up therapy as arranged by surgeon;Outpatient PT    Equipment Recommendations  None recommended by PT    Recommendations for Other Services       Precautions / Restrictions Precautions Precautions: Knee Required Braces or Orthoses: Knee Immobilizer - Left Restrictions Other Position/Activity Restrictions: WBAT       Mobility  Bed Mobility               General bed mobility comments: pt up in recliner on arrival  Transfers Overall transfer level: Needs assistance Equipment used: Rolling walker (2 wheeled) Transfers: Sit to/from Stand Sit to Stand: Min guard         General transfer comment: verbal cues for UE and LE positioning  Ambulation/Gait Ambulation/Gait assistance: Min guard Ambulation Distance (Feet): 140 Feet Assistive device: Rolling walker (2 wheeled) Gait Pattern/deviations: Step-to pattern;Step-through pattern;Decreased stride length;Decreased stance time - left;Antalgic     General Gait Details: verbal cues for sequence, RW positioning, step length, posture  Stairs            Wheelchair Mobility    Modified Rankin (Stroke Patients Only)       Balance                                             Pertinent Vitals/Pain Pain Assessment: 0-10 Pain Score: 3  Pain  Location: L knee  Pain Descriptors / Indicators: Aching;Sore Pain Intervention(s): Monitored during session;Limited activity within patient's tolerance;Ice applied;Repositioned    Home Living Family/patient expects to be discharged to:: Private residence Living Arrangements: Spouse/significant other Available Help at Discharge: Family Type of Home: House Home Access: Stairs to enter Entrance Stairs-Rails: Right Entrance Stairs-Number of Steps: [place]: One level Home Equipment: Environmental consultant - 2 wheels;Bedside commode;Cane - single point;Crutches      Prior Function Level of Independence: Independent               Hand Dominance        Extremity/Trunk Assessment        Lower Extremity Assessment Lower Extremity Assessment: LLE deficits/detail LLE Deficits / Details: able to perform SLR, good quad contraction, L knee AAROM 80* sitting       Communication   Communication: No difficulties  Cognition Arousal/Alertness: Awake/alert Behavior During Therapy: WFL for tasks assessed/performed Overall Cognitive Status: Within Functional Limits for tasks assessed                                        General Comments      Exercises Total Joint Exercises Ankle Circles/Pumps: AROM;Both;10 reps Quad Sets: AROM;Both;10 reps Short Arc  Quad: AROM;Left;10 reps Heel Slides: AAROM;Left;10 reps;Seated Hip ABduction/ADduction: AROM;Left;10 reps Straight Leg Raises: AROM;Left;10 reps   Assessment/Plan    PT Assessment Patient needs continued PT services  PT Problem List Decreased strength;Decreased mobility;Decreased range of motion;Decreased knowledge of use of DME;Pain;Decreased knowledge of precautions       PT Treatment Interventions Functional mobility training;Stair training;Gait training;Therapeutic exercise;DME instruction;Therapeutic activities;Patient/family education    PT Goals (Current goals can be found in the Care Plan section)  Acute  Rehab PT Goals PT Goal Formulation: With patient Time For Goal Achievement: 07/23/17 Potential to Achieve Goals: Good    Frequency 7X/week   Barriers to discharge        Co-evaluation               AM-PAC PT "6 Clicks" Daily Activity  Outcome Measure Difficulty turning over in bed (including adjusting bedclothes, sheets and blankets)?: None Difficulty moving from lying on back to sitting on the side of the bed? : None Difficulty sitting down on and standing up from a chair with arms (e.g., wheelchair, bedside commode, etc,.)?: A Little Help needed moving to and from a bed to chair (including a wheelchair)?: A Little Help needed walking in hospital room?: A Little Help needed climbing 3-5 steps with a railing? : A Lot 6 Click Score: 19    End of Session Equipment Utilized During Treatment: Gait belt;Left knee immobilizer Activity Tolerance: Patient tolerated treatment well Patient left: in chair;with call bell/phone within reach;with bed alarm set   PT Visit Diagnosis: Difficulty in walking, not elsewhere classified (R26.2)    Time: 3329-5188 PT Time Calculation (min) (ACUTE ONLY): 22 min   Charges:   PT Evaluation $PT Eval Low Complexity: 1 Low     PT G CodesCarmelia Bake, PT, DPT 07/19/2017 Pager: [phone]  York Ram E 07/19/2017, 5:19 PM

## 2017-07-19 NOTE — Progress Notes (Signed)
253mL bolus NS administered as per MD order. BP re-checked after bolus and VSS with BP 125/65 and HR 59. Pt sitting in chair, visiting with friend, asymptomatic, and in no distress at this time.

## 2017-07-19 NOTE — Progress Notes (Signed)
Physical Therapy Treatment Note    07/19/17 1722  PT Visit Information  Last PT Received On 07/19/17  Assistance Needed +1  History of Present Illness Pt is a 66 y/o M s/p L TKA; PMHx includes Hypercholesterolemia, HTN, seizure disorder, L rotator cuff tear   Subjective Data  Subjective Pt ambulated again in hallway and assisted back to bed.  Precautions  Precautions Knee  Precaution Comments able to perform SLR  Restrictions  Other Position/Activity Restrictions WBAT   Pain Assessment  Pain Assessment 0-10  Pain Score 1  Pain Location L knee   Pain Descriptors / Indicators Aching;Sore  Pain Intervention(s) Limited activity within patient's tolerance;Monitored during session;Repositioned;Ice applied  Cognition  Arousal/Alertness Awake/alert  Behavior During Therapy WFL for tasks assessed/performed  Overall Cognitive Status Within Functional Limits for tasks assessed  Bed Mobility  Overal bed mobility Needs Assistance  Bed Mobility Sit to Supine  Sit to supine Supervision  Transfers  Overall transfer level Needs assistance  Equipment used Rolling walker (2 wheeled)  Transfers Sit to/from Stand  Sit to Stand Min guard  General transfer comment verbal cues for UE and LE positioning  Ambulation/Gait  Ambulation/Gait assistance Min guard  Ambulation Distance (Feet) 200 Feet  Assistive device Rolling walker (2 wheeled)  Gait Pattern/deviations Step-through pattern;Decreased stride length;Decreased stance time - left;Antalgic  General Gait Details verbal cues for sequence, RW positioning, step length, posture  PT - End of Session  Activity Tolerance Patient tolerated treatment well  Patient left in bed;with call bell/phone within reach  PT - Assessment/Plan  PT Plan Current plan remains appropriate  PT Visit Diagnosis Difficulty in walking, not elsewhere classified (R26.2)  PT Frequency (ACUTE ONLY) 7X/week  Follow Up Recommendations DC plan and follow up therapy as arranged  by surgeon;Outpatient PT  PT equipment None recommended by PT  AM-PAC PT "6 Clicks" Daily Activity Outcome Measure  Difficulty turning over in bed (including adjusting bedclothes, sheets and blankets)? 4  Difficulty moving from lying on back to sitting on the side of the bed?  4  Difficulty sitting down on and standing up from a chair with arms (e.g., wheelchair, bedside commode, etc,.)? 3  Help needed moving to and from a bed to chair (including a wheelchair)? 3  Help needed walking in hospital room? 3  Help needed climbing 3-5 steps with a railing?  3  6 Click Score 20  Mobility G Code  CJ  PT Goal Progression  Progress towards PT goals Progressing toward goals  PT Time Calculation  PT Start Time (ACUTE ONLY) 1450  PT Stop Time (ACUTE ONLY) 1503  PT Time Calculation (min) (ACUTE ONLY) 13 min  PT General Charges  $$ ACUTE PT VISIT 1 Visit  PT Treatments  $Gait Training 8-22 mins   Carmelia Bake, PT, DPT 07/19/2017 Pager: [phone]

## 2017-07-19 NOTE — Progress Notes (Signed)
Discharge planning, no HH needs identified. Plan for OP PT, has dme. [phone]

## 2017-07-19 NOTE — Progress Notes (Signed)
   Subjective: 1 Day Post-Op Procedure(s) (LRB): LEFT TOTAL KNEE ARTHROPLASTY (Left) Patient reports pain as mild.   Patient seen in rounds for Dr. Wynelle Link. Patient is well, but has had some minor complaints of pain in the knee, requiring pain medications We will start therapy today.  Plan is to go Home after hospital stay.  Objective: Vital signs in last 24 hours: Temp:  [97.5 F (36.4 C)-98.3 F (36.8 C)] 98 F (36.7 C) (09/25 1000) Pulse Rate:  [54-68] 58 (09/25 1000) Resp:  [11-17] 17 (09/25 1000) BP: (97-133)/(53-80) 107/53 (09/25 1000) SpO2:  [94 %-100 %] 94 % (09/25 1000)  Intake/Output from previous day:  Intake/Output Summary (Last 24 hours) at 07/19/17 1040 Last data filed at 07/19/17 0800  Gross per 24 hour  Intake             5255 ml  Output             3445 ml  Net             1810 ml    Intake/Output this shift: Total I/O In: 240 [P.O.:240] Out: -   Labs:  Recent Labs  07/19/17 0533  HGB 11.9*    Recent Labs  07/19/17 0533  WBC 15.6*  RBC 3.81*  HCT 35.3*  PLT 178    Recent Labs  07/19/17 0533  NA 137  K 4.6  CL 105  CO2 25  BUN 20  CREATININE 1.33*  GLUCOSE 154*  CALCIUM 8.5*   No results for input(s): LABPT, INR in the last 72 hours.  EXAM General - Patient is Alert, Appropriate and Oriented Extremity - Neurovascular intact Sensation intact distally Intact pulses distally Dorsiflexion/Plantar flexion intact Dressing - dressing C/D/I Motor Function - intact, moving foot and toes well on exam.  Hemovac pulled without difficulty.  Past Medical History:  Diagnosis Date  . Cancer (HCC)    Skin, melanoma  . Chronic renal insufficiency   . Dyslipidemia   . Headache(784.0)    Migraine  . Hypertension   . Memory disturbance   . Obstructive sleep apnea on CPAP   . Prostatitis   . Renal cell carcinoma of left kidney (HCC)   . Rotator cuff tear    Left  . Rotator cuff tear    left shoulder   . Seizure ([place])    hx since  1996/7 ; per patient , has short bursts of seizures characterized with expressive aphasia, managed by neurologist Dr Jannifer Franklin    Assessment/Plan: 1 Day Post-Op Procedure(s) (LRB): LEFT TOTAL KNEE ARTHROPLASTY (Left) Principal Problem:   OA (osteoarthritis) of knee  Estimated body mass index is 25.36 kg/m as calculated from the following:   Height as of this encounter: 6' (1.829 m).   Weight as of this encounter: 84.8 kg (187 lb). Advance diet Up with therapy Plan for discharge tomorrow Discharge home and straight to outpatient therapy  DVT Prophylaxis - Xarelto Weight-Bearing as tolerated to left leg D/C O2 and Pulse OX and try on Room Air  Arlee Muslim, PA-C Orthopaedic Surgery 07/19/2017, 10:40 AM

## 2017-07-19 NOTE — Evaluation (Signed)
Occupational Therapy Evaluation Patient Details Name: Kenneth Ortega MRN: [id number] DOB: 1951/09/19 Today's Date: 07/19/2017    History of Present Illness Pt is a 66 y/o M s/p L TKA; PMHx includes Hypercholesterolemia, HTN, seizure disorder, L rotator cuff tear    Clinical Impression   This 66 y/o M presents with the above. At baseline Pt is independent with ADLs and functional mobility. Pt currently requires MinGuard assist during room level functional mobility using RW, MaxA for LB ADLs. Pt will return home with spouse who is able to assist with ADLs PRN. Will continue to follow acutely to progress Pt's safety and independence with ADLs and functional mobility prior to return home.     Follow Up Recommendations  DC plan and follow up therapy as arranged by surgeon;Supervision/Assistance - 24 hour    Equipment Recommendations  None recommended by OT           Precautions / Restrictions Precautions Precautions: Knee Precaution Comments: verbally reviewed no pillow under knee  Required Braces or Orthoses: Knee Immobilizer - Left Restrictions Weight Bearing Restrictions: No Other Position/Activity Restrictions: WBAT LLE       Mobility Bed Mobility Overal bed mobility: Needs Assistance Bed Mobility: Supine to Sit     Supine to sit: HOB elevated;Supervision     General bed mobility comments: supervision for safety  Transfers Overall transfer level: Needs assistance Equipment used: Rolling walker (2 wheeled) Transfers: Sit to/from Stand Sit to Stand: Min assist;Min guard         General transfer comment: MinA initially from EOB; MinGuard from [facility]; verbal cues for hand/LE placement     Balance Overall balance assessment: No apparent balance deficits (not formally assessed)                                         ADL either performed or assessed with clinical judgement   ADL Overall ADL's : Needs assistance/impaired Eating/Feeding: Set  up;Sitting   Grooming: Min guard;Standing   Upper Body Bathing: Min guard;Sitting   Lower Body Bathing: Minimal assistance;Sit to/from stand   Upper Body Dressing : Min guard;Sitting   Lower Body Dressing: Maximal assistance;Sit to/from stand Lower Body Dressing Details (indicate cue type and reason): total assist to don socks at bed level  Toilet Transfer: Min guard;Ambulation;BSC;RW Toilet Transfer Details (indicate cue type and reason): BSC over toilet  Toileting- Clothing Manipulation and Hygiene: Min guard;Sit to/from stand   Tub/ Shower Transfer: Walk-in shower;Min guard;Ambulation;Rolling walker   Functional mobility during ADLs: Min guard;Rolling walker General ADL Comments: reviewed compensatory techniques for completing ADLs and  functional mobility transfers. Pt has AE at home, will plan to follow up as schedule permits for further education on use of AE to complete ADLs     Pertinent Vitals/Pain Pain Assessment: Faces Faces Pain Scale: Hurts a little bit Pain Location: L knee  Pain Descriptors / Indicators: Aching;Sore Pain Intervention(s): Monitored during session;Repositioned;Ice applied          Extremity/Trunk Assessment Upper Extremity Assessment Upper Extremity Assessment: Overall WFL for tasks assessed   Lower Extremity Assessment Lower Extremity Assessment: Defer to PT evaluation   Cervical / Trunk Assessment Cervical / Trunk Assessment: Normal   Communication Communication Communication: No difficulties   Cognition Arousal/Alertness: Awake/alert Behavior During Therapy: WFL for tasks assessed/performed Overall Cognitive Status: Within Functional Limits for tasks assessed  General Comments: Min verbal safety cues for RW use    General Comments                  Home Living Family/patient expects to be discharged to:: Private residence Living Arrangements: Spouse/significant other Available  Help at Discharge: Family Type of Home: House Home Access: Stairs to enter Technical brewer of Steps: [place]: One level     Bathroom Shower/Tub: Occupational psychologist: [place]: Environmental consultant - 2 wheels;Bedside commode          Prior Functioning/Environment Level of Independence: Independent                 OT Problem List: Decreased strength;Decreased activity tolerance;Decreased knowledge of use of DME or AE;Decreased knowledge of precautions;Decreased range of motion      OT Treatment/Interventions: Self-care/ADL training;DME and/or AE instruction;Therapeutic activities;Therapeutic exercise;Balance training;Patient/family education    OT Goals(Current goals can be found in the care plan section) Acute Rehab OT Goals Patient Stated Goal: return home  OT Goal Formulation: With patient Time For Goal Achievement: 07/26/17 Potential to Achieve Goals: Good  OT Frequency: Min 2X/week                             AM-PAC PT "6 Clicks" Daily Activity     Outcome Measure Help from another person eating meals?: None Help from another person taking care of personal grooming?: None Help from another person toileting, which includes using toliet, bedpan, or urinal?: A Little Help from another person bathing (including washing, rinsing, drying)?: A Little Help from another person to put on and taking off regular upper body clothing?: A Little Help from another person to put on and taking off regular lower body clothing?: A Lot 6 Click Score: 19   End of Session Equipment Utilized During Treatment: Gait belt;Rolling walker Nurse Communication: Mobility status  Activity Tolerance: Patient tolerated treatment well Patient left: in chair;with call bell/phone within reach;with chair alarm set  OT Visit Diagnosis: Other abnormalities of gait and mobility (R26.89)                Time: 4656-8127 OT Time Calculation (min): 29  min Charges:  OT General Charges $OT Visit: 1 Visit OT Treatments $Self Care/Home Management : 8-22 mins G-Codes:     Lou Cal, OT Pager [phone] 07/19/2017  Kenneth Ortega 07/19/2017, 9:46 AM

## 2017-07-19 NOTE — Progress Notes (Signed)
Pt has declined the use of CPAP tonight.  RT to monitor and assess as needed.

## 2017-07-19 NOTE — Discharge Summary (Signed)
Physician Discharge Summary   Patient ID: Kenneth Ortega MRN: [id number] DOB/AGE: 05-23-1951 66 y.o.  Admit date: 07/18/2017 Discharge date:  07/20/2017 Primary Diagnosis:  Osteoarthritis  Left knee(s) Admission Diagnoses:  Past Medical History:  Diagnosis Date  . Cancer (HCC)    Skin, melanoma  . Chronic renal insufficiency   . Dyslipidemia   . Headache(784.0)    Migraine  . Hypertension   . Memory disturbance   . Obstructive sleep apnea on CPAP   . Prostatitis   . Renal cell carcinoma of left kidney (HCC)   . Rotator cuff tear    Left  . Rotator cuff tear    left shoulder   . Seizure ([place])    hx since 1996/7 ; per patient , has short bursts of seizures characterized with expressive aphasia, managed by neurologist Dr Jannifer Franklin   Discharge Diagnoses:   Principal Problem:   OA (osteoarthritis) of knee  Estimated body mass index is 25.36 kg/m as calculated from the following:   Height as of this encounter: 6' (1.829 m).   Weight as of this encounter: 84.8 kg (187 lb).  Procedure:  Procedure(s) (LRB): LEFT TOTAL KNEE ARTHROPLASTY (Left)   Consults: None  HPI: Kenneth Ortega is a 66 y.o. year old male with end stage OA of his left knee with progressively worsening pain and dysfunction. He has constant pain, with activity and at rest and significant functional deficits with difficulties even with ADLs. He has had extensive non-op management including analgesics, injections of cortisone and viscosupplements, and home exercise program, but remains in significant pain with significant dysfunction. Radiographs show bone on bone arthritis medial and patellofemoral with varus deformity. He presents now for left Total Knee Arthroplasty.    Laboratory Data: Admission on 07/18/2017  Component Date Value Ref Range Status  . WBC 07/19/2017 15.6* 4.0 - 10.5 K/uL Final  . RBC 07/19/2017 3.81* 4.22 - 5.81 MIL/uL Final  . Hemoglobin 07/19/2017 11.9* 13.0 - 17.0 g/dL Final  . HCT  07/19/2017 35.3* 39.0 - 52.0 % Final  . MCV 07/19/2017 92.7  78.0 - 100.0 fL Final  . MCH 07/19/2017 31.2  26.0 - 34.0 pg Final  . MCHC 07/19/2017 33.7  30.0 - 36.0 g/dL Final  . RDW 07/19/2017 12.2  11.5 - 15.5 % Final  . Platelets 07/19/2017 178  150 - 400 K/uL Final  . Sodium 07/19/2017 137  135 - 145 mmol/L Final  . Potassium 07/19/2017 4.6  3.5 - 5.1 mmol/L Final  . Chloride 07/19/2017 105  101 - 111 mmol/L Final  . CO2 07/19/2017 25  22 - 32 mmol/L Final  . Glucose, Bld 07/19/2017 154* 65 - 99 mg/dL Final  . BUN 07/19/2017 20  6 - 20 mg/dL Final  . Creatinine, Ser 07/19/2017 1.33* 0.61 - 1.24 mg/dL Final  . Calcium 07/19/2017 8.5* 8.9 - 10.3 mg/dL Final  . GFR calc non Af Amer 07/19/2017 53* >60 mL/min Final  . GFR calc Af Amer 07/19/2017 >60  >60 mL/min Final   Comment: (NOTE) The eGFR has been calculated using the CKD EPI equation. This calculation has not been validated in all clinical situations. eGFR's persistently <60 mL/min signify possible Chronic Kidney Disease.   Georgiann Hahn gap 07/19/2017 7  5 - 15 Final  Hospital Outpatient Visit on 07/11/2017  Component Date Value Ref Range Status  . aPTT 07/11/2017 30  24 - 36 seconds Final  . WBC 07/11/2017 5.2  4.0 - 10.5 K/uL Final  .  RBC 07/11/2017 4.34  4.22 - 5.81 MIL/uL Final  . Hemoglobin 07/11/2017 13.3  13.0 - 17.0 g/dL Final  . HCT 07/11/2017 40.2  39.0 - 52.0 % Final  . MCV 07/11/2017 92.6  78.0 - 100.0 fL Final  . MCH 07/11/2017 30.6  26.0 - 34.0 pg Final  . MCHC 07/11/2017 33.1  30.0 - 36.0 g/dL Final  . RDW 07/11/2017 12.0  11.5 - 15.5 % Final  . Platelets 07/11/2017 192  150 - 400 K/uL Final  . Sodium 07/11/2017 139  135 - 145 mmol/L Final  . Potassium 07/11/2017 4.2  3.5 - 5.1 mmol/L Final  . Chloride 07/11/2017 105  101 - 111 mmol/L Final  . CO2 07/11/2017 26  22 - 32 mmol/L Final  . Glucose, Bld 07/11/2017 108* 65 - 99 mg/dL Final  . BUN 07/11/2017 20  6 - 20 mg/dL Final  . Creatinine, Ser 07/11/2017 1.31*  0.61 - 1.24 mg/dL Final  . Calcium 07/11/2017 9.4  8.9 - 10.3 mg/dL Final  . Total Protein 07/11/2017 7.2  6.5 - 8.1 g/dL Final  . Albumin 07/11/2017 4.6  3.5 - 5.0 g/dL Final  . AST 07/11/2017 17  15 - 41 U/L Final  . ALT 07/11/2017 16* 17 - 63 U/L Final  . Alkaline Phosphatase 07/11/2017 60  38 - 126 U/L Final  . Total Bilirubin 07/11/2017 0.4  0.3 - 1.2 mg/dL Final  . GFR calc non Af Amer 07/11/2017 54* >60 mL/min Final  . GFR calc Af Amer 07/11/2017 >60  >60 mL/min Final   Comment: (NOTE) The eGFR has been calculated using the CKD EPI equation. This calculation has not been validated in all clinical situations. eGFR's persistently <60 mL/min signify possible Chronic Kidney Disease.   . Anion gap 07/11/2017 8  5 - 15 Final  . Prothrombin Time 07/11/2017 13.0  11.4 - 15.2 seconds Final  . INR 07/11/2017 0.99   Final  . ABO/RH(D) 07/11/2017 A POS   Final  . Antibody Screen 07/11/2017 NEG   Final  . Sample Expiration 07/11/2017 07/21/2017   Final  . Extend sample reason 07/11/2017 NO TRANSFUSIONS OR PREGNANCY IN THE PAST 3 MONTHS   Final  . MRSA, PCR 07/11/2017 NEGATIVE  NEGATIVE Final  . Staphylococcus aureus 07/11/2017 NEGATIVE  NEGATIVE Final   Comment: (NOTE) The Xpert SA Assay (FDA approved for NASAL specimens in patients 62 years of age and older), is one component of a comprehensive surveillance program. It is not intended to diagnose infection nor to guide or monitor treatment.      X-Rays:No results found.  EKG: Orders placed or performed during the hospital encounter of 07/11/17  . EKG 12 lead  . EKG 12 lead     Hospital Course: Kenneth Ortega is a 66 y.o. who was admitted to [facility]. They were brought to the operating room on 07/18/2017 and underwent Procedure(s): LEFT TOTAL KNEE ARTHROPLASTY.  Patient tolerated the procedure well and was later transferred to the recovery room and then to the orthopaedic floor for postoperative care.  They were given  PO and IV analgesics for pain control following their surgery.  They were given 24 hours of postoperative antibiotics of  Anti-infectives    Start     Dose/Rate Route Frequency Ordered Stop   07/18/17 1730  ceFAZolin (ANCEF) IVPB 2g/100 mL premix     2 g 200 mL/hr over 30 Minutes Intravenous Every 6 hours 07/18/17 1350 07/18/17 2308   07/18/17 0911  ceFAZolin (ANCEF) 2-4 GM/100ML-% IVPB    Comments:  Bridget Hartshorn   : cabinet override      07/18/17 0911 07/18/17 1105   07/18/17 0908  ceFAZolin (ANCEF) IVPB 2g/100 mL premix     2 g 200 mL/hr over 30 Minutes Intravenous On call to O.R. 07/18/17 0908 07/18/17 1120     and started on DVT prophylaxis in the form of Xarelto.   PT and OT were ordered for total joint protocol.  Discharge planning consulted to help with postop disposition and equipment needs.  Patient had a decent night on the evening of surgery.  They started to get up OOB with therapy on day one. Hemovac drain was pulled without difficulty.  Continued to work with therapy into day two.  Dressing was changed on day two and the incision was healing well.  Patient was seen in rounds and was ready to go home on POD 2.  Diet - Cardiac diet Follow up - in 2 weeks Activity - WBAT Disposition - Home Condition Upon Discharge - Stable D/C Meds - See DC Summary DVT Prophylaxis - Xarelto   Discharge Instructions    Call MD / Call 911    Complete by:  As directed    If you experience chest pain or shortness of breath, CALL 911 and be transported to the hospital emergency room.  If you develope a fever above 101 F, pus (white drainage) or increased drainage or redness at the wound, or calf pain, call your surgeon's office.   Change dressing    Complete by:  As directed    Change dressing daily with sterile 4 x 4 inch gauze dressing and apply TED hose. Do not submerge the incision under water.   Constipation Prevention    Complete by:  As directed    Drink plenty of fluids.  Prune juice  may be helpful.  You may use a stool softener, such as Colace (over the counter) 100 mg twice a day.  Use MiraLax (over the counter) for constipation as needed.   Diet - low sodium heart healthy    Complete by:  As directed    Discharge instructions    Complete by:  As directed    Take Xarelto for two and a half more weeks, then discontinue Xarelto.  Once the patient has completed the blood thinner regimen, then take a Baby 81 mg Aspirin daily for three more weeks.   Pick up stool softner and laxative for home use following surgery while on pain medications. Do not submerge incision under water. Please use good hand washing techniques while changing dressing each day. May shower starting three days after surgery. Please use a clean towel to pat the incision dry following showers. Continue to use ice for pain and swelling after surgery. Do not use any lotions or creams on the incision until instructed by your surgeon.  Wear both TED hose on both legs during the day every day for three weeks, but may remove the TED hose at night at home.  Postoperative Constipation Protocol  Constipation - defined medically as fewer than three stools per week and severe constipation as less than one stool per week.  One of the most common issues patients have following surgery is constipation.  Even if you have a regular bowel pattern at home, your normal regimen is likely to be disrupted due to multiple reasons following surgery.  Combination of anesthesia, postoperative narcotics, change in appetite and fluid intake all can  affect your bowels.  In order to avoid complications following surgery, here are some recommendations in order to help you during your recovery period.  Colace (docusate) - Pick up an over-the-counter form of Colace or another stool softener and take twice a day as long as you are requiring postoperative pain medications.  Take with a full glass of water daily.  If you experience loose  stools or diarrhea, hold the colace until you stool forms back up.  If your symptoms do not get better within 1 week or if they get worse, check with your doctor.  Dulcolax (bisacodyl) - Pick up over-the-counter and take as directed by the product packaging as needed to assist with the movement of your bowels.  Take with a full glass of water.  Use this product as needed if not relieved by Colace only.   MiraLax (polyethylene glycol) - Pick up over-the-counter to have on hand.  MiraLax is a solution that will increase the amount of water in your bowels to assist with bowel movements.  Take as directed and can mix with a glass of water, juice, soda, coffee, or tea.  Take if you go more than two days without a movement. Do not use MiraLax more than once per day. Call your doctor if you are still constipated or irregular after using this medication for 7 days in a row.  If you continue to have problems with postoperative constipation, please contact the office for further assistance and recommendations.  If you experience "the worst abdominal pain ever" or develop nausea or vomiting, please contact the office immediatly for further recommendations for treatment.   Do not put a pillow under the knee. Place it under the heel.    Complete by:  As directed    Do not sit on low chairs, stoools or toilet seats, as it may be difficult to get up from low surfaces    Complete by:  As directed    Driving restrictions    Complete by:  As directed    No driving until released by the physician.   Increase activity slowly as tolerated    Complete by:  As directed    Lifting restrictions    Complete by:  As directed    No lifting until released by the physician.   Patient may shower    Complete by:  As directed    You may shower without a dressing once there is no drainage.  Do not wash over the wound.  If drainage remains, do not shower until drainage stops.   TED hose    Complete by:  As directed    Use  stockings (TED hose) for 3 weeks on both leg(s).  You may remove them at night for sleeping.   Weight bearing as tolerated    Complete by:  As directed    Laterality:  left   Extremity:  Lower     Allergies as of 07/19/2017      Reactions   Tylox [oxycodone-acetaminophen] Other (See Comments)   Unknown   Keppra [levetiracetam] Other (See Comments)   Patient unsure of reaction   Oxycodone Other (See Comments)   hallucinations       Medication List    TAKE these medications   BENICAR 5 MG tablet Generic drug:  olmesartan Take 5 mg by mouth daily.   cyclobenzaprine 10 MG tablet Commonly known as:  FLEXERIL Take 1 tablet (10 mg total) by mouth 3 (three) times daily as needed  for muscle spasms.   FLUoxetine 20 MG capsule Commonly known as:  PROZAC Take 20 mg by mouth daily.   HYDROmorphone 2 MG tablet Commonly known as:  DILAUDID Take 1-2 tablets (2-4 mg total) by mouth every 4 (four) hours as needed for severe pain.   lamoTRIgine 200 MG tablet Commonly known as:  LAMICTAL Take 1 tablet (200 mg total) by mouth 2 (two) times daily.   mometasone 50 MCG/ACT nasal spray Commonly known as:  NASONEX Place 2 sprays into the nose daily as needed (for allergies).   rivaroxaban 10 MG Tabs tablet Commonly known as:  XARELTO Take 1 tablet (10 mg total) by mouth daily with breakfast.   simvastatin 20 MG tablet Commonly known as:  ZOCOR Take 20 mg by mouth daily.   tamsulosin 0.4 MG Caps capsule Commonly known as:  FLOMAX Take 0.4 mg by mouth daily.   zolpidem 10 MG tablet Commonly known as:  AMBIEN Take 1 tablet (10 mg total) by mouth at bedtime as needed for sleep.            Discharge Care Instructions        Start     Ordered   07/20/17 0000  rivaroxaban (XARELTO) 10 MG TABS tablet  Daily with breakfast    Question:  Supervising Provider  Answer:  Gaynelle Arabian   07/19/17 2147   07/19/17 0000  cyclobenzaprine (FLEXERIL) 10 MG tablet  3 times daily PRN      Question:  Supervising Provider  Answer:  Gaynelle Arabian   07/19/17 2147   07/19/17 0000  HYDROmorphone (DILAUDID) 2 MG tablet  Every 4 hours PRN    Question:  Supervising Provider  Answer:  Gaynelle Arabian   07/19/17 2147   07/19/17 0000  Call MD / Call 911    Comments:  If you experience chest pain or shortness of breath, CALL 911 and be transported to the hospital emergency room.  If you develope a fever above 101 F, pus (white drainage) or increased drainage or redness at the wound, or calf pain, call your surgeon's office.   07/19/17 2147   07/19/17 0000  Discharge instructions    Comments:  Take Xarelto for two and a half more weeks, then discontinue Xarelto.  Once the patient has completed the blood thinner regimen, then take a Baby 81 mg Aspirin daily for three more weeks.   Pick up stool softner and laxative for home use following surgery while on pain medications. Do not submerge incision under water. Please use good hand washing techniques while changing dressing each day. May shower starting three days after surgery. Please use a clean towel to pat the incision dry following showers. Continue to use ice for pain and swelling after surgery. Do not use any lotions or creams on the incision until instructed by your surgeon.  Wear both TED hose on both legs during the day every day for three weeks, but may remove the TED hose at night at home.  Postoperative Constipation Protocol  Constipation - defined medically as fewer than three stools per week and severe constipation as less than one stool per week.  One of the most common issues patients have following surgery is constipation.  Even if you have a regular bowel pattern at home, your normal regimen is likely to be disrupted due to multiple reasons following surgery.  Combination of anesthesia, postoperative narcotics, change in appetite and fluid intake all can affect your bowels.  In order to avoid  complications following  surgery, here are some recommendations in order to help you during your recovery period.  Colace (docusate) - Pick up an over-the-counter form of Colace or another stool softener and take twice a day as long as you are requiring postoperative pain medications.  Take with a full glass of water daily.  If you experience loose stools or diarrhea, hold the colace until you stool forms back up.  If your symptoms do not get better within 1 week or if they get worse, check with your doctor.  Dulcolax (bisacodyl) - Pick up over-the-counter and take as directed by the product packaging as needed to assist with the movement of your bowels.  Take with a full glass of water.  Use this product as needed if not relieved by Colace only.   MiraLax (polyethylene glycol) - Pick up over-the-counter to have on hand.  MiraLax is a solution that will increase the amount of water in your bowels to assist with bowel movements.  Take as directed and can mix with a glass of water, juice, soda, coffee, or tea.  Take if you go more than two days without a movement. Do not use MiraLax more than once per day. Call your doctor if you are still constipated or irregular after using this medication for 7 days in a row.  If you continue to have problems with postoperative constipation, please contact the office for further assistance and recommendations.  If you experience "the worst abdominal pain ever" or develop nausea or vomiting, please contact the office immediatly for further recommendations for treatment.   07/19/17 2147   07/19/17 0000  Diet - low sodium heart healthy     07/19/17 2147   07/19/17 0000  Constipation Prevention    Comments:  Drink plenty of fluids.  Prune juice may be helpful.  You may use a stool softener, such as Colace (over the counter) 100 mg twice a day.  Use MiraLax (over the counter) for constipation as needed.   07/19/17 2147   07/19/17 0000  Increase activity slowly as tolerated     07/19/17 2147    07/19/17 0000  Patient may shower    Comments:  You may shower without a dressing once there is no drainage.  Do not wash over the wound.  If drainage remains, do not shower until drainage stops.   07/19/17 2147   07/19/17 0000  Weight bearing as tolerated    Question Answer Comment  Laterality left   Extremity Lower      07/19/17 2147   07/19/17 0000  Driving restrictions    Comments:  No driving until released by the physician.   07/19/17 2147   07/19/17 0000  Lifting restrictions    Comments:  No lifting until released by the physician.   07/19/17 2147   07/19/17 0000  TED hose    Comments:  Use stockings (TED hose) for 3 weeks on both leg(s).  You may remove them at night for sleeping.   07/19/17 2147   07/19/17 0000  Change dressing    Comments:  Change dressing daily with sterile 4 x 4 inch gauze dressing and apply TED hose. Do not submerge the incision under water.   07/19/17 2147   07/19/17 0000  Do not put a pillow under the knee. Place it under the heel.     07/19/17 2147   07/19/17 0000  Do not sit on low chairs, stoools or toilet seats, as it may be difficult  to get up from low surfaces     07/19/17 2147     Follow-up Information    Gaynelle Arabian, MD. Schedule an appointment as soon as possible for a visit on 08/02/2017.   Specialty:  Orthopedic Surgery Contact information: [address] [place] [address] [phone]           Signed: Arlee Muslim, PA-C Orthopaedic Surgery 07/19/2017, 9:48 PM

## 2017-07-20 LAB — BASIC METABOLIC PANEL
Anion gap: 6 (ref 5–15)
BUN: 21 mg/dL — AB (ref 6–20)
CHLORIDE: 109 mmol/L (ref 101–111)
CO2: 25 mmol/L (ref 22–32)
CREATININE: 1.31 mg/dL — AB (ref 0.61–1.24)
Calcium: 8.6 mg/dL — ABNORMAL LOW (ref 8.9–10.3)
GFR calc Af Amer: >60 mL/min (ref >60)
GFR calc non Af Amer: 54 mL/min — ABNORMAL LOW (ref >60)
Glucose, Bld: 126 mg/dL — ABNORMAL HIGH (ref 65–99)
Potassium: 4.3 mmol/L (ref 3.5–5.1)
Sodium: 140 mmol/L (ref 135–145)

## 2017-07-20 LAB — CBC
HEMATOCRIT: 31.9 % — AB (ref 39.0–52.0)
HEMOGLOBIN: 10.5 g/dL — AB (ref 13.0–17.0)
MCH: 30.1 pg (ref 26.0–34.0)
MCHC: 32.9 g/dL (ref 30.0–36.0)
MCV: 91.4 fL (ref 78.0–100.0)
Platelets: 151 10*3/uL (ref 150–400)
RBC: 3.49 MIL/uL — ABNORMAL LOW (ref 4.22–5.81)
RDW: 12.3 % (ref 11.5–15.5)
WBC: 13.6 10*3/uL — ABNORMAL HIGH (ref 4.0–10.5)

## 2017-07-20 NOTE — Progress Notes (Signed)
Physical Therapy Treatment Patient Details Name: Kenneth Ortega MRN: [id number] DOB: 06/04/1951 Today's Date: 07/20/2017    History of Present Illness Pt is a 66 y/o M s/p L TKA; PMHx includes Hypercholesterolemia, HTN, seizure disorder, L rotator cuff tear     PT Comments    Pt ambulated in hallway and practiced safe stair technique with rail and crutch.  Pt performed LE exercises and provided with HEP handout.  Pt to d/c home later today.  Follow Up Recommendations  DC plan and follow up therapy as arranged by surgeon;Outpatient PT     Equipment Recommendations  None recommended by PT    Recommendations for Other Services       Precautions / Restrictions Precautions Precautions: Knee Precaution Comments: able to perform SLR Required Braces or Orthoses: Knee Immobilizer - Left Restrictions Weight Bearing Restrictions: No Other Position/Activity Restrictions: WBAT     Mobility  Bed Mobility Overal bed mobility: Needs Assistance Bed Mobility: Supine to Sit;Sit to Supine     Supine to sit: Supervision Sit to supine: Supervision   General bed mobility comments: increased time and effort  Transfers Overall transfer level: Needs assistance Equipment used: Rolling walker (2 wheeled) Transfers: Sit to/from Stand Sit to Stand: Min guard         General transfer comment: verbal cues for UE and LE positioning, cue to wait for RW for support/pain control/safety  Ambulation/Gait Ambulation/Gait assistance: Min guard Ambulation Distance (Feet): 140 Feet Assistive device: Rolling walker (2 wheeled) Gait Pattern/deviations: Step-through pattern;Decreased stride length;Decreased stance time - left;Antalgic     General Gait Details: verbal cues for sequence, RW positioning, step length, posture   Stairs Stairs: Yes   Stair Management: Step to pattern;Forwards;One rail Right;With crutches Number of Stairs: 3 General stair comments: verbal cues for sequence and  safety, pt used R rail and crutch, performed twice, reports understanding  Wheelchair Mobility    Modified Rankin (Stroke Patients Only)       Balance                                            Cognition Arousal/Alertness: Awake/alert Behavior During Therapy: WFL for tasks assessed/performed Overall Cognitive Status: Within Functional Limits for tasks assessed                                        Exercises Total Joint Exercises Ankle Circles/Pumps: AROM;Both;10 reps Quad Sets: AROM;Both;10 reps Short Arc Quad: AROM;Left;10 reps Heel Slides: AAROM;Left;10 reps Hip ABduction/ADduction: Left;10 reps;AAROM Straight Leg Raises: AAROM;Left;10 reps    General Comments        Pertinent Vitals/Pain Pain Assessment: 0-10 Pain Score: 6  Faces Pain Scale: Hurts little more Pain Location: L knee  Pain Descriptors / Indicators: Aching;Sore;Tightness Pain Intervention(s): Monitored during session;Repositioned;Ice applied;Limited activity within patient's tolerance;Premedicated before session    Home Living                      Prior Function            PT Goals (current goals can now be found in the care plan section) Acute Rehab PT Goals Patient Stated Goal: return home  Progress towards PT goals: Progressing toward goals    Frequency    7X/week  PT Plan Current plan remains appropriate    Co-evaluation              AM-PAC PT "6 Clicks" Daily Activity  Outcome Measure  Difficulty turning over in bed (including adjusting bedclothes, sheets and blankets)?: None Difficulty moving from lying on back to sitting on the side of the bed? : None Difficulty sitting down on and standing up from a chair with arms (e.g., wheelchair, bedside commode, etc,.)?: A Little Help needed moving to and from a bed to chair (including a wheelchair)?: A Little Help needed walking in hospital room?: A Little Help needed climbing  3-5 steps with a railing? : A Little 6 Click Score: 20    End of Session   Activity Tolerance: Patient tolerated treatment well Patient left: in bed;with call bell/phone within reach   PT Visit Diagnosis: Difficulty in walking, not elsewhere classified (R26.2)     Time: 4008-6761 PT Time Calculation (min) (ACUTE ONLY): 27 min  Charges:  $Gait Training: 8-22 mins $Therapeutic Exercise: 8-22 mins                    G Codes:      Carmelia Bake, PT, DPT 07/20/2017 Pager: [phone]  York Ram E 07/20/2017, 1:01 PM

## 2017-07-20 NOTE — Progress Notes (Signed)
Occupational Therapy Treatment Patient Details Name: Kenneth Ortega MRN: [id number] DOB: [birth year] Today's Date: 07/20/2017    History of present illness Pt is a 66 y/o M s/p L TKA; PMHx includes Hypercholesterolemia, HTN, seizure disorder, L rotator cuff tear    OT comments  Pt progressing towards goals. Reviewed AE/compensatory techniques for completing LB ADLs this session with Pt verbalizing and return demonstrating understanding. Pt able to recall shower transfer sequence reviewed in previous session. Pt reports feeling comfortable completing ADLs after return home and with spouse assist PRN. Education provided and questions answered throughout. No further acute OT needs identified at this time. Will sign off.    Follow Up Recommendations  DC plan and follow up therapy as arranged by surgeon;Supervision/Assistance - 24 hour    Equipment Recommendations  None recommended by OT          Precautions / Restrictions Precautions Precautions: Knee Precaution Comments: able to perform SLR; reviewed no pillow under bend of knee  Required Braces or Orthoses: Knee Immobilizer - Left Restrictions Weight Bearing Restrictions: No Other Position/Activity Restrictions: WBAT        Mobility Bed Mobility Overal bed mobility: Needs Assistance Bed Mobility: Sit to Supine;Supine to Sit     Supine to sit: HOB elevated;Supervision Sit to supine: Supervision   General bed mobility comments: supervision for safety                                                                    ADL either performed or assessed with clinical judgement   ADL Overall ADL's : Needs assistance/impaired                     Lower Body Dressing: Maximal assistance;Sit to/from stand Lower Body Dressing Details (indicate cue type and reason): total assist to don socks sitting EOB; educated on use of sock aide to don socks with Pt return demonstrating; educated on use of  reacher to complete LB dressing with Pt return demonstrating            Tub/Shower Transfer Details (indicate cue type and reason): Pt able to recall and verbalize correct sequence for shower transfer    General ADL Comments: Reviewed and educated on safety and compensatory techniques for completing ADLs after return home.                        Cognition Arousal/Alertness: Awake/alert Behavior During Therapy: WFL for tasks assessed/performed Overall Cognitive Status: Within Functional Limits for tasks assessed                                                            Pertinent Vitals/ Pain       Pain Assessment: Faces Faces Pain Scale: Hurts little more Pain Location: L knee  Pain Descriptors / Indicators: Aching;Sore Pain Intervention(s): Limited activity within patient's tolerance;Monitored during session;Ice applied  Frequency  Min 2X/week        Progress Toward Goals  OT Goals(current goals can now be found in the care plan section)  Progress towards OT goals: Goals met/education completed, patient discharged from OT  Acute Rehab OT Goals Patient Stated Goal: return home  OT Goal Formulation: With patient Time For Goal Achievement: 07/26/17 Potential to Achieve Goals: Good  Plan Discharge plan remains appropriate                     AM-PAC PT "6 Clicks" Daily Activity     Outcome Measure   Help from another person eating meals?: None Help from another person taking care of personal grooming?: None Help from another person toileting, which includes using toliet, bedpan, or urinal?: A Little Help from another person bathing (including washing, rinsing, drying)?: A Little Help from another person to put on and taking off regular upper body clothing?: A Little Help from another person to put on and taking off regular lower body clothing?:  A Lot 6 Click Score: 19    End of Session    OT Visit Diagnosis: Other abnormalities of gait and mobility (R26.89);Pain Pain - Right/Left: Left Pain - part of body: Knee   Activity Tolerance Patient tolerated treatment well   Patient Left in bed;with call bell/phone within reach;with nursing/sitter in room   Nurse Communication Mobility status        Time: 0825-0850 OT Time Calculation (min): 25 min  Charges: OT General Charges $OT Visit: 1 Visit OT Treatments $Self Care/Home Management : 8-22 mins  Kenneth Ortega, OT Pager [phone] 07/20/2017    Kenneth Ortega 07/20/2017, 9:34 AM

## 2017-07-20 NOTE — Progress Notes (Signed)
   Subjective: 2 Days Post-Op Procedure(s) (LRB): LEFT TOTAL KNEE ARTHROPLASTY (Left) Patient reports pain as mild.   Patient seen in rounds with Dr. Wynelle Link. Patient is well, and has had no acute complaints or problems Patient is ready to go home  Objective: Vital signs in last 24 hours: Temp:  [97.5 F (36.4 C)-98.7 F (37.1 C)] 98 F (36.7 C) (09/26 0557) Pulse Rate:  [53-64] 53 (09/26 0557) Resp:  [16-18] 16 (09/26 0557) BP: (107-125)/(53-78) 123/69 (09/26 0557) SpO2:  [94 %-96 %] 96 % (09/26 0557)  Intake/Output from previous day:  Intake/Output Summary (Last 24 hours) at 07/20/17 0716 Last data filed at 07/20/17 0558  Gross per 24 hour  Intake              960 ml  Output             2150 ml  Net            -1190 ml    Intake/Output this shift: No intake/output data recorded.  Labs:  Recent Labs  07/19/17 0533 07/20/17 0534  HGB 11.9* 10.5*    Recent Labs  07/19/17 0533 07/20/17 0534  WBC 15.6* 13.6*  RBC 3.81* 3.49*  HCT 35.3* 31.9*  PLT 178 151    Recent Labs  07/19/17 0533 07/20/17 0534  NA 137 140  K 4.6 4.3  CL 105 109  CO2 25 25  BUN 20 21*  CREATININE 1.33* 1.31*  GLUCOSE 154* 126*  CALCIUM 8.5* 8.6*   No results for input(s): LABPT, INR in the last 72 hours.  EXAM: General - Patient is Alert and Appropriate Extremity - Neurovascular intact Sensation intact distally Incision - clean, dry Motor Function - intact, moving foot and toes well on exam.   Assessment/Plan: 2 Days Post-Op Procedure(s) (LRB): LEFT TOTAL KNEE ARTHROPLASTY (Left) Procedure(s) (LRB): LEFT TOTAL KNEE ARTHROPLASTY (Left) Past Medical History:  Diagnosis Date  . Cancer (HCC)    Skin, melanoma  . Chronic renal insufficiency   . Dyslipidemia   . Headache(784.0)    Migraine  . Hypertension   . Memory disturbance   . Obstructive sleep apnea on CPAP   . Prostatitis   . Renal cell carcinoma of left kidney (HCC)   . Rotator cuff tear    Left  . Rotator  cuff tear    left shoulder   . Seizure ([place])    hx since 1996/7 ; per patient , has short bursts of seizures characterized with expressive aphasia, managed by neurologist Dr Jannifer Franklin   Principal Problem:   OA (osteoarthritis) of knee  Estimated body mass index is 25.36 kg/m as calculated from the following:   Height as of this encounter: 6' (1.829 m).   Weight as of this encounter: 84.8 kg (187 lb).  Diet - Cardiac diet Follow up - in 2 weeks Activity - WBAT Disposition - Home Condition Upon Discharge - Stable D/C Meds - See DC Summary DVT Prophylaxis - Xarelto  Arlee Muslim, PA-C Orthopaedic Surgery 07/20/2017, 7:16 AM

## 2017-07-22 ENCOUNTER — Emergency Department (HOSPITAL_COMMUNITY): Payer: Medicare Other

## 2017-07-22 ENCOUNTER — Encounter (HOSPITAL_COMMUNITY): Payer: Self-pay | Admitting: Emergency Medicine

## 2017-07-22 ENCOUNTER — Inpatient Hospital Stay (HOSPITAL_COMMUNITY)
Admission: EM | Admit: 2017-07-22 | Discharge: 2017-07-24 | DRG: 092 | Disposition: A | Discharge status: Home / Self Care | Payer: Medicare Other | Attending: Internal Medicine | Admitting: Internal Medicine

## 2017-07-22 DIAGNOSIS — Z885 Allergy status to narcotic agent status: Secondary | ICD-10-CM | POA: Diagnosis not present

## 2017-07-22 DIAGNOSIS — Z96652 Presence of left artificial knee joint: Secondary | ICD-10-CM

## 2017-07-22 DIAGNOSIS — T402X5A Adverse effect of other opioids, initial encounter: Secondary | ICD-10-CM | POA: Diagnosis present

## 2017-07-22 DIAGNOSIS — G92 Toxic encephalopathy: Principal | ICD-10-CM | POA: Diagnosis present

## 2017-07-22 DIAGNOSIS — R4182 Altered mental status, unspecified: Secondary | ICD-10-CM

## 2017-07-22 DIAGNOSIS — Z85820 Personal history of malignant melanoma of skin: Secondary | ICD-10-CM | POA: Diagnosis not present

## 2017-07-22 DIAGNOSIS — Z905 Acquired absence of kidney: Secondary | ICD-10-CM

## 2017-07-22 DIAGNOSIS — N183 Chronic kidney disease, stage 3 (moderate): Secondary | ICD-10-CM | POA: Diagnosis not present

## 2017-07-22 DIAGNOSIS — Z79899 Other long term (current) drug therapy: Secondary | ICD-10-CM | POA: Diagnosis not present

## 2017-07-22 DIAGNOSIS — W19XXXA Unspecified fall, initial encounter: Secondary | ICD-10-CM | POA: Diagnosis present

## 2017-07-22 DIAGNOSIS — Z9989 Dependence on other enabling machines and devices: Secondary | ICD-10-CM | POA: Diagnosis not present

## 2017-07-22 DIAGNOSIS — Z7901 Long term (current) use of anticoagulants: Secondary | ICD-10-CM | POA: Diagnosis not present

## 2017-07-22 DIAGNOSIS — N179 Acute kidney failure, unspecified: Secondary | ICD-10-CM | POA: Diagnosis not present

## 2017-07-22 DIAGNOSIS — I129 Hypertensive chronic kidney disease with stage 1 through stage 4 chronic kidney disease, or unspecified chronic kidney disease: Secondary | ICD-10-CM | POA: Diagnosis present

## 2017-07-22 DIAGNOSIS — Z888 Allergy status to other drugs, medicaments and biological substances status: Secondary | ICD-10-CM

## 2017-07-22 DIAGNOSIS — Z85528 Personal history of other malignant neoplasm of kidney: Secondary | ICD-10-CM

## 2017-07-22 DIAGNOSIS — I10 Essential (primary) hypertension: Secondary | ICD-10-CM

## 2017-07-22 DIAGNOSIS — M25562 Pain in left knee: Secondary | ICD-10-CM

## 2017-07-22 DIAGNOSIS — M79609 Pain in unspecified limb: Secondary | ICD-10-CM | POA: Diagnosis not present

## 2017-07-22 DIAGNOSIS — S0990XA Unspecified injury of head, initial encounter: Secondary | ICD-10-CM | POA: Diagnosis present

## 2017-07-22 DIAGNOSIS — R569 Unspecified convulsions: Secondary | ICD-10-CM | POA: Diagnosis present

## 2017-07-22 DIAGNOSIS — F32A Depression, unspecified: Secondary | ICD-10-CM | POA: Diagnosis present

## 2017-07-22 DIAGNOSIS — G4733 Obstructive sleep apnea (adult) (pediatric): Secondary | ICD-10-CM | POA: Diagnosis present

## 2017-07-22 DIAGNOSIS — G9341 Metabolic encephalopathy: Secondary | ICD-10-CM | POA: Diagnosis present

## 2017-07-22 DIAGNOSIS — Z471 Aftercare following joint replacement surgery: Secondary | ICD-10-CM | POA: Diagnosis not present

## 2017-07-22 DIAGNOSIS — R509 Fever, unspecified: Secondary | ICD-10-CM | POA: Diagnosis not present

## 2017-07-22 DIAGNOSIS — E785 Hyperlipidemia, unspecified: Secondary | ICD-10-CM | POA: Diagnosis present

## 2017-07-22 DIAGNOSIS — M7989 Other specified soft tissue disorders: Secondary | ICD-10-CM | POA: Diagnosis not present

## 2017-07-22 DIAGNOSIS — R410 Disorientation, unspecified: Secondary | ICD-10-CM | POA: Diagnosis not present

## 2017-07-22 DIAGNOSIS — M1712 Unilateral primary osteoarthritis, left knee: Secondary | ICD-10-CM | POA: Diagnosis not present

## 2017-07-22 DIAGNOSIS — E860 Dehydration: Secondary | ICD-10-CM | POA: Diagnosis not present

## 2017-07-22 DIAGNOSIS — F329 Major depressive disorder, single episode, unspecified: Secondary | ICD-10-CM | POA: Diagnosis present

## 2017-07-22 LAB — URINALYSIS, ROUTINE W REFLEX MICROSCOPIC
BILIRUBIN URINE: NEGATIVE
Bacteria, UA: NONE SEEN
Glucose, UA: NEGATIVE mg/dL
HGB URINE DIPSTICK: NEGATIVE
Ketones, ur: NEGATIVE mg/dL
LEUKOCYTES UA: NEGATIVE
NITRITE: NEGATIVE
PH: 5 (ref 5.0–8.0)
Protein, ur: 30 mg/dL — AB
SPECIFIC GRAVITY, URINE: 1.019 (ref 1.005–1.030)
SQUAMOUS EPITHELIAL / LPF: NONE SEEN

## 2017-07-22 LAB — CBC WITH DIFFERENTIAL/PLATELET
BASOS ABS: 0 10*3/uL (ref 0.0–0.1)
BASOS PCT: 0 %
EOS ABS: 0.3 10*3/uL (ref 0.0–0.7)
EOS PCT: 3 %
HCT: 31 % — ABNORMAL LOW (ref 39.0–52.0)
Hemoglobin: 10.3 g/dL — ABNORMAL LOW (ref 13.0–17.0)
LYMPHS PCT: 12 %
Lymphs Abs: 1.2 10*3/uL (ref 0.7–4.0)
MCH: 30.7 pg (ref 26.0–34.0)
MCHC: 33.2 g/dL (ref 30.0–36.0)
MCV: 92.3 fL (ref 78.0–100.0)
MONO ABS: 1 10*3/uL (ref 0.1–1.0)
Monocytes Relative: 11 %
Neutro Abs: 7.3 10*3/uL (ref 1.7–7.7)
Neutrophils Relative %: 74 %
PLATELETS: 154 10*3/uL (ref 150–400)
RBC: 3.36 MIL/uL — AB (ref 4.22–5.81)
RDW: 12.4 % (ref 11.5–15.5)
WBC: 9.8 10*3/uL (ref 4.0–10.5)

## 2017-07-22 LAB — I-STAT CG4 LACTIC ACID, ED: Lactic Acid, Venous: 0.77 mmol/L (ref 0.5–1.9)

## 2017-07-22 LAB — COMPREHENSIVE METABOLIC PANEL
ALK PHOS: 104 U/L (ref 38–126)
ALT: 26 U/L (ref 17–63)
AST: 30 U/L (ref 15–41)
Albumin: 3.2 g/dL — ABNORMAL LOW (ref 3.5–5.0)
Anion gap: 9 (ref 5–15)
BILIRUBIN TOTAL: 0.4 mg/dL (ref 0.3–1.2)
BUN: 28 mg/dL — AB (ref 6–20)
CALCIUM: 8.5 mg/dL — AB (ref 8.9–10.3)
CO2: 22 mmol/L (ref 22–32)
Chloride: 104 mmol/L (ref 101–111)
Creatinine, Ser: 1.65 mg/dL — ABNORMAL HIGH (ref 0.61–1.24)
GFR calc Af Amer: 48 mL/min — ABNORMAL LOW (ref >60)
GFR, EST NON AFRICAN AMERICAN: 41 mL/min — AB (ref >60)
GLUCOSE: 121 mg/dL — AB (ref 65–99)
POTASSIUM: 4.2 mmol/L (ref 3.5–5.1)
SODIUM: 135 mmol/L (ref 135–145)
TOTAL PROTEIN: 6.2 g/dL — AB (ref 6.5–8.1)

## 2017-07-22 LAB — LACTIC ACID, PLASMA: LACTIC ACID, VENOUS: 0.7 mmol/L (ref 0.5–1.9)

## 2017-07-22 LAB — PROTIME-INR
INR: 1.52
Prothrombin Time: 18.2 seconds — ABNORMAL HIGH (ref 11.4–15.2)

## 2017-07-22 LAB — SEDIMENTATION RATE: Sed Rate: 95 mm/hr — ABNORMAL HIGH (ref 0–16)

## 2017-07-22 MED ORDER — SIMVASTATIN 20 MG PO TABS
20.0000 mg | ORAL_TABLET | Freq: Every day | ORAL | Status: DC
  Administered 2017-07-23 – 2017-07-24 (×2): 20 mg via ORAL
  Filled 2017-07-22 (×3): qty 1

## 2017-07-22 MED ORDER — TAMSULOSIN HCL 0.4 MG PO CAPS
0.4000 mg | ORAL_CAPSULE | Freq: Every day | ORAL | Status: DC
  Administered 2017-07-23 – 2017-07-24 (×2): 0.4 mg via ORAL
  Filled 2017-07-22 (×2): qty 1

## 2017-07-22 MED ORDER — ONDANSETRON HCL 4 MG PO TABS: 4.0000 mg | ORAL_TABLET | Freq: Four times a day (QID) | ORAL | Status: DC | PRN

## 2017-07-22 MED ORDER — HYDROMORPHONE HCL 2 MG PO TABS: 2.0000 mg | ORAL_TABLET | ORAL | Status: DC | PRN

## 2017-07-22 MED ORDER — ACETAMINOPHEN 325 MG PO TABS: 650.0000 mg | ORAL_TABLET | Freq: Four times a day (QID) | ORAL | Status: DC | PRN

## 2017-07-22 MED ORDER — RIVAROXABAN 10 MG PO TABS
10.0000 mg | ORAL_TABLET | Freq: Every day | ORAL | Status: DC
  Filled 2017-07-22: qty 1

## 2017-07-22 MED ORDER — ZOLPIDEM TARTRATE 5 MG PO TABS: 5.0000 mg | ORAL_TABLET | Freq: Every evening | ORAL | Status: DC | PRN

## 2017-07-22 MED ORDER — HYDRALAZINE HCL 20 MG/ML IJ SOLN: 5.0000 mg | INTRAMUSCULAR | Status: DC | PRN

## 2017-07-22 MED ORDER — LAMOTRIGINE 100 MG PO TABS
200.0000 mg | ORAL_TABLET | Freq: Two times a day (BID) | ORAL | Status: DC
  Administered 2017-07-22 – 2017-07-24 (×4): 200 mg via ORAL
  Filled 2017-07-22 (×4): qty 2

## 2017-07-22 MED ORDER — FLUTICASONE PROPIONATE 50 MCG/ACT NA SUSP
1.0000 | Freq: Every day | NASAL | Status: DC
  Administered 2017-07-23: 1 via NASAL
  Filled 2017-07-22: qty 16

## 2017-07-22 MED ORDER — CEFAZOLIN SODIUM-DEXTROSE 1-4 GM/50ML-% IV SOLN
1.0000 g | Freq: Four times a day (QID) | INTRAVENOUS | Status: AC
  Administered 2017-07-22 – 2017-07-23 (×3): 1 g via INTRAVENOUS
  Filled 2017-07-22 (×3): qty 50

## 2017-07-22 MED ORDER — CYCLOBENZAPRINE HCL 5 MG PO TABS
5.0000 mg | ORAL_TABLET | Freq: Three times a day (TID) | ORAL | Status: DC | PRN
Start: 2017-07-22 — End: 2017-07-24

## 2017-07-22 MED ORDER — SODIUM CHLORIDE 0.9 % IV BOLUS (SEPSIS)
1000.0000 mL | Freq: Once | INTRAVENOUS | Status: AC
  Administered 2017-07-22: 1000 mL via INTRAVENOUS

## 2017-07-22 MED ORDER — HEPARIN SODIUM (PORCINE) 5000 UNIT/ML IJ SOLN
5000.0000 [IU] | Freq: Three times a day (TID) | INTRAMUSCULAR | Status: DC
  Administered 2017-07-23 – 2017-07-24 (×4): 5000 [IU] via SUBCUTANEOUS
  Filled 2017-07-22 (×5): qty 1

## 2017-07-22 MED ORDER — SODIUM CHLORIDE 0.9 % IV SOLN
INTRAVENOUS | Status: DC
  Administered 2017-07-23 (×2): via INTRAVENOUS

## 2017-07-22 MED ORDER — ONDANSETRON HCL 4 MG/2ML IJ SOLN: 4.0000 mg | Freq: Four times a day (QID) | INTRAMUSCULAR | Status: DC | PRN

## 2017-07-22 MED ORDER — POLYETHYLENE GLYCOL 3350 17 G PO PACK
17.0000 g | PACK | Freq: Every day | ORAL | Status: DC | PRN
  Administered 2017-07-24: 17 g via ORAL
  Filled 2017-07-22: qty 1

## 2017-07-22 MED ORDER — HYDROMORPHONE HCL 2 MG PO TABS
1.0000 mg | ORAL_TABLET | Freq: Four times a day (QID) | ORAL | Status: DC | PRN
  Administered 2017-07-23: 1 mg via ORAL
  Filled 2017-07-22: qty 1

## 2017-07-22 MED ORDER — SENNOSIDES-DOCUSATE SODIUM 8.6-50 MG PO TABS
1.0000 | ORAL_TABLET | Freq: Every day | ORAL | Status: DC
  Administered 2017-07-22 – 2017-07-23 (×2): 1 via ORAL
  Filled 2017-07-22 (×2): qty 1

## 2017-07-22 MED ORDER — FLUOXETINE HCL 20 MG PO CAPS
20.0000 mg | ORAL_CAPSULE | Freq: Every day | ORAL | Status: DC
  Administered 2017-07-23 – 2017-07-24 (×2): 20 mg via ORAL
  Filled 2017-07-22 (×2): qty 1

## 2017-07-22 MED ORDER — ACETAMINOPHEN 650 MG RE SUPP: 650.0000 mg | Freq: Four times a day (QID) | RECTAL | Status: DC | PRN

## 2017-07-22 MED ORDER — SODIUM CHLORIDE 0.9 % IV BOLUS (SEPSIS)
2000.0000 mL | Freq: Once | INTRAVENOUS | Status: AC
  Administered 2017-07-22: 2000 mL via INTRAVENOUS

## 2017-07-22 MED ORDER — HYDROMORPHONE HCL 1 MG/ML IJ SOLN
1.0000 mg | Freq: Once | INTRAMUSCULAR | Status: AC
  Administered 2017-07-22: 1 mg via INTRAVENOUS
  Filled 2017-07-22: qty 1

## 2017-07-22 MED ORDER — CEFAZOLIN SODIUM-DEXTROSE 1-4 GM/50ML-% IV SOLN: 1.0000 g | Freq: Once | INTRAVENOUS | Status: DC

## 2017-07-22 NOTE — H&P (Signed)
History and Physical    Kenneth Ortega:[id number] DOB: 01/30/1951 DOA: 07/22/2017  Referring MD/NP/PA:   PCP: [name], MD   Patient coming from:  The patient is coming from home.  At baseline, pt is independent for most of ADL.       Chief Complaint: confusion   HPI: Kenneth Ortega is a 66 y.o. male with medical history significant of hypertension, hyperlipidemia, depression, absent seizure, left renal cell cancer (s/p of nephrectomy), prostatitis, OSA on CPAP, CKD-3, who presents with confusion.  Patient had left knee replacement surgery by Dr. [name] on 07/17/17. Pt is currently on xarelto for DVT PPx.  Per his wife, pt is very weak, and becomes confused today. He is so weak, that he cannot walk by himself. Pt has worsening pain in the left knee. He denies fever and chills, but his temperature is 100 in ED. Wife reports that pt fell yesterday and hit the back of his head. Wife reports that pt had an similar episode after a previous surgery where he seizures after surgery. She reports this is similar. Patient denies chest pain, shortness rest, cough, nausea, vomiting, diarrhea or abdominal pain. Pupil is constipated currently. Patient has burning on urination and dysuria. When I saw pt in ED, he is confused, but is still oriented x 3.  ED Course: pt was found to have WBC 9.8, lactic acid 0.77, negative urinalysis, slightly worsening renal function, temperature 100, tachycardia, oxygen saturation 93-95% on room air, no tachypnea, chest x-ray negative, CT head negative for acute intracranial abnormalities. X-ray of left kidney showed soft tissue swelling. Patient is admitted to telemetry bed as inpatient. Orthopedic surgeon, Dr. [name] was consulted by EDP.  Review of Systems:   General: has fevers, no chills, no body weight gain,  has fatigue HEENT: no blurry vision, hearing changes or sore throat Respiratory: no dyspnea, coughing, wheezing CV: no chest pain, no palpitations GI: no  nausea, vomiting, abdominal pain, diarrhea, constipation GU: has dysuria, burning on urination, increased urinary frequency, no hematuria  Ext: no leg edema Neuro: no unilateral weakness, numbness, or tingling, no vision change or hearing loss Skin: no rash, no skin tear. MSK: s/p of left knee replacement with left knee s swelling.  Heme: No easy bruising.  Travel history: No recent long distant travel.  Allergy:  Allergies  Allergen Reactions  . Tylox [Oxycodone-Acetaminophen] Other (See Comments)    Unknown  . Keppra [Levetiracetam] Other (See Comments)    Patient unsure of reaction  . Oxycodone Other (See Comments)    hallucinations     Past Medical History:  Diagnosis Date  . Cancer (HCC)    Skin, melanoma  . Chronic renal insufficiency   . Dyslipidemia   . Headache(784.0)    Migraine  . Hypertension   . Memory disturbance   . Obstructive sleep apnea on CPAP   . Prostatitis   . Renal cell carcinoma of left kidney (HCC)   . Rotator cuff tear    Left  . Rotator cuff tear    left shoulder   . Seizure ([place])    hx since 1996/7 ; per patient , has short bursts of seizures characterized with expressive aphasia, managed by neurologist Dr Jannifer Franklin    Past Surgical History:  Procedure Laterality Date  . Arthroscopic surgery     Left knee  . INCISIONAL HERNIA REPAIR     Left flank  . KIDNEY SURGERY Left 2007   Removed due to cancer; per patient "  my other kidney is functioning great right now"  . Pilonidal cyst resection    . SKIN CANCER DESTRUCTION    . TOTAL KNEE ARTHROPLASTY Left 07/18/2017   Procedure: LEFT TOTAL KNEE ARTHROPLASTY;  Surgeon: Gaynelle Arabian, MD;  Location: [facility];  Service: Orthopedics;  Laterality: Left;    Social History:  reports that he has never smoked. He has never used smokeless tobacco. He reports that he drinks about 0.6 oz of alcohol per week . He reports that he does not use drugs.  Family History:  Family History  Problem Relation Age  of Onset  . Cancer Mother   . Heart Problems Mother   . Throat cancer Father   . Diabetes Sister      Prior to Admission medications   Medication Sig Start Date End Date Taking? Authorizing Provider  BENICAR 5 MG tablet Take 5 mg by mouth daily.  08/04/15  Yes [provider]  cyclobenzaprine (FLEXERIL) 10 MG tablet Take 1 tablet (10 mg total) by mouth 3 (three) times daily as needed for muscle spasms. 07/19/17  Yes Perkins, Alexzandrew L, PA-C  FLUoxetine (PROZAC) 20 MG capsule Take 20 mg by mouth daily. 06/09/13  Yes [provider]  HYDROmorphone (DILAUDID) 2 MG tablet Take 1-2 tablets (2-4 mg total) by mouth every 4 (four) hours as needed for severe pain. 07/19/17  Yes Perkins, Alexzandrew L, PA-C  lamoTRIgine (LAMICTAL) 200 MG tablet Take 1 tablet (200 mg total) by mouth 2 (two) times daily. 05/17/17  Yes Kathrynn Ducking, MD  mometasone (NASONEX) 50 MCG/ACT nasal spray Place 2 sprays into the nose daily as needed (for allergies).    Yes [provider]  rivaroxaban (XARELTO) 10 MG TABS tablet Take 1 tablet (10 mg total) by mouth daily with breakfast. 07/20/17  Yes Perkins, Alexzandrew L, PA-C  simvastatin (ZOCOR) 20 MG tablet Take 20 mg by mouth daily.  06/09/15  Yes [provider]  tamsulosin (FLOMAX) 0.4 MG CAPS capsule Take 0.4 mg by mouth daily.   Yes [provider]  zolpidem (AMBIEN) 10 MG tablet Take 1 tablet (10 mg total) by mouth at bedtime as needed for sleep. 05/10/17  Yes Kathrynn Ducking, MD    Physical Exam: Vitals:   07/22/17 2315 07/22/17 2330 07/22/17 2345 07/23/17 0000  BP:  (!) 148/69  (!) 135/107  Pulse: 81 77 (!) 32 93  Resp:    17  Temp:      TempSrc:      SpO2: 96% 96% (!) 78% 93%  Weight:      Height:       General: Not in acute distress HEENT:       Eyes: PERRL, EOMI, no scleral icterus.       ENT: No discharge from the ears and nose, no pharynx injection, no tonsillar enlargement.        Neck: No JVD, no  bruit, no mass felt. Heme: No neck lymph node enlargement. Cardiac: S1/S2, RRR, No murmurs, No gallops or rubs. Respiratory: No rales, wheezing, rhonchi or rubs. GI: Soft, nondistended, nontender, no rebound pain, no organomegaly, BS present. GU: No hematuria Ext:  2+DP/PT pulse bilaterally. Musculoskeletal: s/p of left knee replacement, with local erythema, warmth and tenderness. Left knee is swelling, no drainage. Skin: No rashes.  Neuro: mildly confused, but oriented X3, cranial nerves II-XII grossly intact, moves all extremities normally.  Psych: Patient is not psychotic, no suicidal or hemocidal ideation.  Labs on Admission: I have personally  reviewed following labs and imaging studies  CBC:  Recent Labs Lab 07/19/17 0533 07/20/17 0534 07/22/17 1651  WBC 15.6* 13.6* 9.8  NEUTROABS  --   --  7.3  HGB 11.9* 10.5* 10.3*  HCT 35.3* 31.9* 31.0*  MCV 92.7 91.4 92.3  PLT 178 151 469   Basic Metabolic Panel:  Recent Labs Lab 07/19/17 0533 07/20/17 0534 07/22/17 1651  NA 137 140 135  K 4.6 4.3 4.2  CL 105 109 104  CO2 25 25 22   GLUCOSE 154* 126* 121*  BUN 20 21* 28*  CREATININE 1.33* 1.31* 1.65*  CALCIUM 8.5* 8.6* 8.5*   GFR: Estimated Creatinine Clearance: 47 mL/min (A) (by C-G formula based on SCr of 1.65 mg/dL (H)). Liver Function Tests:  Recent Labs Lab 07/22/17 1651  AST 30  ALT 26  ALKPHOS 104  BILITOT 0.4  PROT 6.2*  ALBUMIN 3.2*   No results for input(s): LIPASE, AMYLASE in the last 168 hours. No results for input(s): AMMONIA in the last 168 hours. Coagulation Profile:  Recent Labs Lab 07/22/17 2230  INR 1.52   Cardiac Enzymes: No results for input(s): CKTOTAL, CKMB, CKMBINDEX, TROPONINI in the last 168 hours. BNP (last 3 results) No results for input(s): PROBNP in the last 8760 hours. HbA1C: No results for input(s): HGBA1C in the last 72 hours. CBG: No results for input(s): GLUCAP in the last 168 hours. Lipid Profile: No results for  input(s): CHOL, HDL, LDLCALC, TRIG, CHOLHDL, LDLDIRECT in the last 72 hours. Thyroid Function Tests: No results for input(s): TSH, T4TOTAL, FREET4, T3FREE, THYROIDAB in the last 72 hours. Anemia Panel: No results for input(s): VITAMINB12, FOLATE, FERRITIN, TIBC, IRON, RETICCTPCT in the last 72 hours. Urine analysis:    Component Value Date/Time   COLORURINE YELLOW 07/22/2017 [place] 07/22/2017 1724   LABSPEC 1.019 07/22/2017 1724   PHURINE 5.0 07/22/2017 1724   GLUCOSEU NEGATIVE 07/22/2017 1724   HGBUR NEGATIVE 07/22/2017 1724   BILIRUBINUR NEGATIVE 07/22/2017 1724   KETONESUR NEGATIVE 07/22/2017 1724   PROTEINUR 30 (A) 07/22/2017 1724   UROBILINOGEN 0.2 07/19/2007 0851   NITRITE NEGATIVE 07/22/2017 1724   LEUKOCYTESUR NEGATIVE 07/22/2017 1724   Sepsis Labs: @LABRCNTIP (procalcitonin:4,lacticidven:4) )No results found for this or any previous visit (from the past 240 hour(s)).   Radiological Exams on Admission: Dg Chest 2 View  Result Date: 07/22/2017 CLINICAL DATA:  66 year old male with recent knee replacement. Fever and confusion. EXAM: CHEST  2 VIEW COMPARISON:  12/06/2016 and earlier. FINDINGS: Semi upright AP and lateral views of the chest. Stable to mildly increased cardiac silhouette. Other mediastinal contours are within normal limits. Visualized tracheal air column is within normal limits. No pneumothorax, pulmonary edema, pleural effusion or confluent pulmonary opacity. No acute osseous abnormality identified. Visible bowel gas pattern is similar an within normal limits. IMPRESSION: No acute cardiopulmonary abnormality. Electronically Signed   By: Genevie Ann M.D.   On: 07/22/2017 17:50   Dg Knee 2 Views Left  Result Date: 07/22/2017 CLINICAL DATA:  66 year old male status post knee replacement this week with continued pain. EXAM: LEFT KNEE - 1-2 VIEW COMPARISON:  None. FINDINGS: Generalized soft tissue swelling about the knee. No subcutaneous gas. No separate  joint effusion is evident. Total knee arthroplasty hardware appears intact and normally aligned. The patella is intact with postoperative changes. No new osseous abnormality identified. IMPRESSION: 1. Generalized soft tissue swelling about the knee with no subcutaneous gas. 2. Left total knee arthroplasty changes with no unexpected bony finding.  Electronically Signed   By: Genevie Ann M.D.   On: 07/22/2017 18:53   Ct Head Wo Contrast  Result Date: 07/22/2017 CLINICAL DATA:  Intermittent confusion following a knee replacement 4 days ago. EXAM: CT HEAD WITHOUT CONTRAST TECHNIQUE: Contiguous axial images were obtained from the base of the skull through the vertex without intravenous contrast. COMPARISON:  Brain MR dated 06/08/2016 and head CT dated 05/03/2016. FINDINGS: Brain: The ventricles and subarachnoid spaces remain mildly prominent. No intracranial hemorrhage, mass lesion or CT evidence of acute infarction. Vascular: No hyperdense vessel or unexpected calcification. Skull: Normal. Negative for fracture or focal lesion. Sinuses/Orbits: Unremarkable. Other: None. IMPRESSION: No acute abnormality. Stable mild diffuse cerebral and cerebellar atrophy. Electronically Signed   By: Claudie Revering M.D.   On: 07/22/2017 18:02     EKG: Independently reviewed. Sinus rhythm, QTC 410, LAD, poor R-wave progression  Assessment/Plan Principal Problem:   Acute metabolic encephalopathy Active Problems:   Depression   Acute renal failure superimposed on stage 3 chronic kidney disease (HCC)   Essential hypertension, malignant   Dyslipidemia   Seizure (HCC)   OSA on CPAP   History of left knee replacement   Acute metabolic encephalopathy: Etiology is not clear. CT head is negative for acute intracranial abnormalities. Differential diagnosis include delirium, side effects from pain medication Dilaudid and side effects of Flexeril. Pt's left knee has mild erythema and swelling, not sure if patient has any infection,  but since pt has fever, indicating pt may have mild cellulitis. Ortho, Dr. [name] was consulted by EDP-->"He advised ancef if concern for cellulitis".  -will admit to tele bed as inpt -start Ancef IV -f/u Blood culture -Frequent neuro check -Decrease dose of Dilaudid from 2-4 milligrams every 4 hours to every 6 hours prn -Decrease Flexeril dose from 10 mg to 5 mg 3 times a day  History of left knee replacement: -see above  Absence Seizure: -seizure precaution -Continue home Lamictal  HLD: -zocor  HTN: -IV hydralazine when necessary -Hold Benicar due to worsening renal function  Depression: -Continue Prozac  AoCKD-III: Baseline Cre is 1.3-1.4, pt's Cre is 1.65, slighlty worsening renal Fx. Likely due to prerenal secondary to dehydration and continuation of ARB - IVF: 3 L normal saline, followed by 75 mL per hour - Follow up renal function by BMP - Hold Benicar  OSA  -on CPAP    DVT ppx: SQ Heparin       Code Status: Full code Family Communication:  Yes, patient's wife at bed side Disposition Plan:  Anticipate discharge back to previous home environment Consults called:  Ortho, Dr. [name] Admission status: Inpatient/tele    Date of Service 07/23/2017    Ivor Costa [facility] Pager [phone]  If 7PM-7AM, please contact night-coverage [email or web address] Password [email or web address] 07/23/2017, 12:22 AM

## 2017-07-22 NOTE — ED Notes (Signed)
Pt requesting pain medication.  ED PA made aware.

## 2017-07-22 NOTE — ED Triage Notes (Signed)
Pt comes from home via EMS with complaints of knee pain after a replacement on Monday. Wife called with concern of periods of confusion since replacement without any pattern. Hx of absence seizures but takes Lamictal. Fever on arrival of EMS. Wife discontinued his dilaudid this morning due to PCP advising her that that may be the cause of confusion. Vitals HR 102, BP 110/62, RR 16, O2 93% RA, CBG 140. Wife at bedside

## 2017-07-22 NOTE — ED Notes (Signed)
Pt given urinal and instructions.  Will check back at a later time.

## 2017-07-22 NOTE — ED Provider Notes (Signed)
[facility] DEPT Provider Note   CSN: [id number] Arrival date & time: 07/22/17  1611     History   Chief Complaint Chief Complaint  Patient presents with  . Knee Pain  . Altered Mental Status    HPI Kenneth Ortega is a 66 y.o. male.  The history is provided by the patient and the spouse. No language interpreter was used.  Altered Mental Status   This is a new problem. The current episode started 2 days ago. The problem has been gradually worsening. Associated symptoms include confusion, weakness and agitation. His past medical history is significant for seizures.  Pt had a total knee replacement on 9/23.  Pt was discharged on 9/25.  [name] reports pt has been confused and not getting out of bed.  She reports she carried pt to rehab yesterday and required an ambulance to get pt home.  Pt reports pt just lying.  Pt not wanting to move or walk.  She reports pt is confused and ask same questions over and over.  Pt is taking dilaudid for pain.   She reports pt had an episode after a previous surgery where he seizures after surgery.  She reports this is similar.  Pt reports pt fell yesterday with trying to stand and hit the back of his head.  Pt is on xarelto.    Past Medical History:  Diagnosis Date  . Cancer (HCC)    Skin, melanoma  . Chronic renal insufficiency   . Dyslipidemia   . Headache(784.0)    Migraine  . Hypertension   . Memory disturbance   . Obstructive sleep apnea on CPAP   . Prostatitis   . Renal cell carcinoma of left kidney (HCC)   . Rotator cuff tear    Left  . Rotator cuff tear    left shoulder   . Seizure ([place])    hx since 1996/7 ; per patient , has short bursts of seizures characterized with expressive aphasia, managed by neurologist Dr Jannifer Franklin    Patient Active Problem List   Diagnosis Date Noted  . OA (osteoarthritis) of knee 07/18/2017  . Paresthesia 11/29/2016  . Periodic limb movement disorder (PLMD) 04/15/2016  . Insomnia with sleep apnea  01/26/2016  . OSA on CPAP 01/26/2016  . Orthostatic hypotension 03/06/2015  . Dizziness and giddiness 03/06/2015  . Seizure ([place])   . Localization-related (focal) (partial) epilepsy and epileptic syndromes with complex partial seizures, without mention of intractable epilepsy 06/19/2013  . Depression 06/19/2013  . Chronic renal insufficiency 06/19/2013  . Essential hypertension, malignant 06/19/2013  . Dyslipidemia 06/19/2013  . Chronic prostatitis 06/19/2013  . Anemia 06/19/2013  . Obstructive sleep apnea 06/19/2013  . Migraine 06/19/2013  . Left rotator cuff tear 06/19/2013    Past Surgical History:  Procedure Laterality Date  . Arthroscopic surgery     Left knee  . INCISIONAL HERNIA REPAIR     Left flank  . KIDNEY SURGERY Left 2007   Removed due to cancer; per patient " my other kidney is functioning great right now"  . Pilonidal cyst resection    . SKIN CANCER DESTRUCTION    . TOTAL KNEE ARTHROPLASTY Left 07/18/2017   Procedure: LEFT TOTAL KNEE ARTHROPLASTY;  Surgeon: Gaynelle Arabian, MD;  Location: [facility];  Service: Orthopedics;  Laterality: Left;       Home Medications    Prior to Admission medications   Medication Sig Start Date End Date Taking? Authorizing Provider  BENICAR 5 MG tablet Take  5 mg by mouth daily.  08/04/15  Yes [provider]  cyclobenzaprine (FLEXERIL) 10 MG tablet Take 1 tablet (10 mg total) by mouth 3 (three) times daily as needed for muscle spasms. 07/19/17  Yes Perkins, Alexzandrew L, PA-C  FLUoxetine (PROZAC) 20 MG capsule Take 20 mg by mouth daily. 06/09/13  Yes [provider]  HYDROmorphone (DILAUDID) 2 MG tablet Take 1-2 tablets (2-4 mg total) by mouth every 4 (four) hours as needed for severe pain. 07/19/17  Yes Perkins, Alexzandrew L, PA-C  lamoTRIgine (LAMICTAL) 200 MG tablet Take 1 tablet (200 mg total) by mouth 2 (two) times daily. 05/17/17  Yes Kathrynn Ducking, MD  mometasone (NASONEX) 50 MCG/ACT nasal spray Place 2  sprays into the nose daily as needed (for allergies).    Yes [provider]  rivaroxaban (XARELTO) 10 MG TABS tablet Take 1 tablet (10 mg total) by mouth daily with breakfast. 07/20/17  Yes Perkins, Alexzandrew L, PA-C  simvastatin (ZOCOR) 20 MG tablet Take 20 mg by mouth daily.  06/09/15  Yes [provider]  tamsulosin (FLOMAX) 0.4 MG CAPS capsule Take 0.4 mg by mouth daily.   Yes [provider]  zolpidem (AMBIEN) 10 MG tablet Take 1 tablet (10 mg total) by mouth at bedtime as needed for sleep. 05/10/17  Yes Kathrynn Ducking, MD    Family History Family History  Problem Relation Age of Onset  . Cancer Mother   . Heart Problems Mother   . Throat cancer Father   . Diabetes Sister     Social History Social History  Substance Use Topics  . Smoking status: Never Smoker  . Smokeless tobacco: Never Used  . Alcohol use 0.6 oz/week    1 Cans of beer per week     Comment: OCC     Allergies   Tylox [oxycodone-acetaminophen]; Keppra [levetiracetam]; and Oxycodone   Review of Systems Review of Systems  Neurological: Positive for weakness.  Psychiatric/Behavioral: Positive for agitation and confusion.  All other systems reviewed and are negative.    Physical Exam Updated Vital Signs BP 134/69   Pulse 83   Temp 99.7 F (37.6 C) (Rectal)   Resp 18   Ht 6' (1.829 m)   Wt 83.9 kg (185 lb)   SpO2 95%   BMI 25.09 kg/m   Physical Exam  Constitutional: He appears well-developed and well-nourished.  HENT:  Head: Normocephalic and atraumatic.  Eyes: Conjunctivae are normal.  Neck: Neck supple.  Cardiovascular: Normal rate and regular rhythm.   No murmur heard. Pulmonary/Chest: Effort normal and breath sounds normal. No respiratory distress.  Abdominal: Soft. There is no tenderness.  Musculoskeletal: He exhibits no edema.  Swelling left knee,  Erythema,  Warm to touch,   Neurological: He is alert.  Confused   Skin: Skin is warm and dry.    Psychiatric: He has a normal mood and affect.  Nursing note and vitals reviewed.    ED Treatments / Results  Labs (all labs ordered are listed, but only abnormal results are displayed) Labs Reviewed  COMPREHENSIVE METABOLIC PANEL - Abnormal; Notable for the following:       Result Value   Glucose, Bld 121 (*)    BUN 28 (*)    Creatinine, Ser 1.65 (*)    Calcium 8.5 (*)    Total Protein 6.2 (*)    Albumin 3.2 (*)    GFR calc non Af Amer 41 (*)    GFR calc Af Amer 48 (*)  All other components within normal limits  CBC WITH DIFFERENTIAL/PLATELET - Abnormal; Notable for the following:    RBC 3.36 (*)    Hemoglobin 10.3 (*)    HCT 31.0 (*)    All other components within normal limits  URINALYSIS, ROUTINE W REFLEX MICROSCOPIC - Abnormal; Notable for the following:    Protein, ur 30 (*)    All other components within normal limits  CULTURE, BLOOD (ROUTINE X 2)  CULTURE, BLOOD (ROUTINE X 2)  URINE CULTURE  I-STAT CG4 LACTIC ACID, ED    EKG  EKG Interpretation None       Radiology Dg Chest 2 View  Result Date: 07/22/2017 CLINICAL DATA:  66 year old male with recent knee replacement. Fever and confusion. EXAM: CHEST  2 VIEW COMPARISON:  12/06/2016 and earlier. FINDINGS: Semi upright AP and lateral views of the chest. Stable to mildly increased cardiac silhouette. Other mediastinal contours are within normal limits. Visualized tracheal air column is within normal limits. No pneumothorax, pulmonary edema, pleural effusion or confluent pulmonary opacity. No acute osseous abnormality identified. Visible bowel gas pattern is similar an within normal limits. IMPRESSION: No acute cardiopulmonary abnormality. Electronically Signed   By: Genevie Ann M.D.   On: 07/22/2017 17:50   Dg Knee 2 Views Left  Result Date: 07/22/2017 CLINICAL DATA:  66 year old male status post knee replacement this week with continued pain. EXAM: LEFT KNEE - 1-2 VIEW COMPARISON:  None. FINDINGS: Generalized soft  tissue swelling about the knee. No subcutaneous gas. No separate joint effusion is evident. Total knee arthroplasty hardware appears intact and normally aligned. The patella is intact with postoperative changes. No new osseous abnormality identified. IMPRESSION: 1. Generalized soft tissue swelling about the knee with no subcutaneous gas. 2. Left total knee arthroplasty changes with no unexpected bony finding. Electronically Signed   By: Genevie Ann M.D.   On: 07/22/2017 18:53   Ct Head Wo Contrast  Result Date: 07/22/2017 CLINICAL DATA:  Intermittent confusion following a knee replacement 4 days ago. EXAM: CT HEAD WITHOUT CONTRAST TECHNIQUE: Contiguous axial images were obtained from the base of the skull through the vertex without intravenous contrast. COMPARISON:  Brain MR dated 06/08/2016 and head CT dated 05/03/2016. FINDINGS: Brain: The ventricles and subarachnoid spaces remain mildly prominent. No intracranial hemorrhage, mass lesion or CT evidence of acute infarction. Vascular: No hyperdense vessel or unexpected calcification. Skull: Normal. Negative for fracture or focal lesion. Sinuses/Orbits: Unremarkable. Other: None. IMPRESSION: No acute abnormality. Stable mild diffuse cerebral and cerebellar atrophy. Electronically Signed   By: Claudie Revering M.D.   On: 07/22/2017 18:02    Procedures Procedures (including critical care time)  Medications Ordered in ED Medications  sodium chloride 0.9 % bolus 1,000 mL (1,000 mLs Intravenous New Bag/Given 07/22/17 1817)     Initial Impression / Assessment and Plan / ED Course  I have reviewed the triage vital signs and the nursing notes.  Pertinent labs & imaging results that were available during my care of the patient were reviewed by me and considered in my medical decision making (see chart for details).      Pt has had an increase in BUN and creatinine in the past 2 days .  Ct head  No intercranial injury.  Bi elevated wbc count.  Lactate is normal,   Wbc count is normal.   Redness to knee is concerning.  Xray no acute.   Pt given Iv fluids x 1 liter.  Pt reports he feels the same.  Final Clinical Impressions(s) / ED Diagnoses   Final diagnoses:  Altered mental status, unspecified altered mental status type  Acute pain of left knee  Dehydration    New Prescriptions New Prescriptions   No medications on file  Dr. Blaine Hamper Hospitalist will admit.   I spoke to Dr. [name].  He will have pt seen by Ortho in am.  He advised ancef if concern for  Cellulitis  First dosage ordered .   Fransico Meadow, PA-C 07/22/17 [place], [place], DO 07/23/17 2320

## 2017-07-23 ENCOUNTER — Inpatient Hospital Stay (HOSPITAL_COMMUNITY): Admit: 2017-07-23 | Payer: Medicare Other

## 2017-07-23 ENCOUNTER — Inpatient Hospital Stay (HOSPITAL_COMMUNITY): Payer: Medicare Other

## 2017-07-23 DIAGNOSIS — G9341 Metabolic encephalopathy: Secondary | ICD-10-CM

## 2017-07-23 DIAGNOSIS — E860 Dehydration: Secondary | ICD-10-CM | POA: Insufficient documentation

## 2017-07-23 DIAGNOSIS — M79609 Pain in unspecified limb: Secondary | ICD-10-CM

## 2017-07-23 DIAGNOSIS — M7989 Other specified soft tissue disorders: Secondary | ICD-10-CM

## 2017-07-23 LAB — CBC
HEMATOCRIT: 25.8 % — AB (ref 39.0–52.0)
Hemoglobin: 8.6 g/dL — ABNORMAL LOW (ref 13.0–17.0)
MCH: 30.8 pg (ref 26.0–34.0)
MCHC: 33.3 g/dL (ref 30.0–36.0)
MCV: 92.5 fL (ref 78.0–100.0)
Platelets: 164 10*3/uL (ref 150–400)
RBC: 2.79 MIL/uL — ABNORMAL LOW (ref 4.22–5.81)
RDW: 12.3 % (ref 11.5–15.5)
WBC: 9.7 10*3/uL (ref 4.0–10.5)

## 2017-07-23 LAB — BASIC METABOLIC PANEL
Anion gap: 8 (ref 5–15)
BUN: 22 mg/dL — AB (ref 6–20)
CHLORIDE: 109 mmol/L (ref 101–111)
CO2: 22 mmol/L (ref 22–32)
Calcium: 8.1 mg/dL — ABNORMAL LOW (ref 8.9–10.3)
Creatinine, Ser: 1.41 mg/dL — ABNORMAL HIGH (ref 0.61–1.24)
GFR calc Af Amer: 58 mL/min — ABNORMAL LOW (ref >60)
GFR, EST NON AFRICAN AMERICAN: 50 mL/min — AB (ref >60)
GLUCOSE: 109 mg/dL — AB (ref 65–99)
POTASSIUM: 4.1 mmol/L (ref 3.5–5.1)
Sodium: 139 mmol/L (ref 135–145)

## 2017-07-23 LAB — CBG MONITORING, ED: Glucose-Capillary: 95 mg/dL (ref 65–99)

## 2017-07-23 LAB — PROCALCITONIN: Procalcitonin: <0.1 ng/mL

## 2017-07-23 LAB — C-REACTIVE PROTEIN: CRP: 19.3 mg/dL — ABNORMAL HIGH (ref <1.0)

## 2017-07-23 MED ORDER — MORPHINE SULFATE 15 MG PO TABS
15.0000 mg | ORAL_TABLET | ORAL | Status: DC | PRN
  Administered 2017-07-23 – 2017-07-24 (×3): 15 mg via ORAL
  Filled 2017-07-23 (×3): qty 1

## 2017-07-23 MED ORDER — LORATADINE 10 MG PO TABS: 10.0000 mg | ORAL_TABLET | Freq: Every day | ORAL | Status: DC | PRN

## 2017-07-23 MED ORDER — SALINE SPRAY 0.65 % NA SOLN
1.0000 | NASAL | Status: DC | PRN
  Administered 2017-07-23: 1 via NASAL
  Filled 2017-07-23: qty 44

## 2017-07-23 NOTE — Progress Notes (Signed)
VASCULAR LAB PRELIMINARY  PRELIMINARY  PRELIMINARY  PRELIMINARY  Left lower extremity venous duplex completed.    Preliminary report:  Left:  No evidence of DVT, superficial thrombosis, or Baker's cyst.  Brydan Downard, RVS 07/23/2017, 4:21 PM

## 2017-07-23 NOTE — ED Notes (Signed)
Pt stood to use urinal and accidentally yanked his IV out.

## 2017-07-23 NOTE — Consult Note (Signed)
Patient ID: ERIN UECKER MRN: [id number] DOB/AGE: 04-14-1951 66 y.o.  Admit date: 07/22/2017  Admission Diagnoses:  Principal Problem:   Acute metabolic encephalopathy Active Problems:   Depression   Acute renal failure superimposed on stage 3 chronic kidney disease (HCC)   Essential hypertension, malignant   Dyslipidemia   Seizure (HCC)   OSA on CPAP   History of left knee replacement   HPI: Very pleasant 66 year old male pt with a hx of a left knee replacement on 9/24.  The pt was discharged on Wednesday 9/26 to home.  He returned to the ED last evening with increasing LLE pain, weakness in the LLE and reported confusion.  The pt fell and hit his head.  The pt has a hx of seizures.  The pt is on Xarelto as prophylaxis for DVT.    Past Medical History: Past Medical History:  Diagnosis Date  . Cancer (HCC)    Skin, melanoma  . Chronic renal insufficiency   . Dyslipidemia   . Headache(784.0)    Migraine  . Hypertension   . Memory disturbance   . Obstructive sleep apnea on CPAP   . Prostatitis   . Renal cell carcinoma of left kidney (HCC)   . Rotator cuff tear    Left  . Rotator cuff tear    left shoulder   . Seizure ([place])    hx since 1996/7 ; per patient , has short bursts of seizures characterized with expressive aphasia, managed by neurologist Dr Jannifer Franklin    Surgical History: Past Surgical History:  Procedure Laterality Date  . Arthroscopic surgery     Left knee  . INCISIONAL HERNIA REPAIR     Left flank  . KIDNEY SURGERY Left 2007   Removed due to cancer; per patient " my other kidney is functioning great right now"  . Pilonidal cyst resection    . SKIN CANCER DESTRUCTION    . TOTAL KNEE ARTHROPLASTY Left 07/18/2017   Procedure: LEFT TOTAL KNEE ARTHROPLASTY;  Surgeon: Gaynelle Arabian, MD;  Location: [facility];  Service: Orthopedics;  Laterality: Left;    Family History: Family History  Problem Relation Age of Onset  . Cancer Mother   . Heart Problems  Mother   . Throat cancer Father   . Diabetes Sister     Social History: Social History   Social History  . Marital status: Married    Spouse name: Olin Hauser  . Number of children: 0  . Years of education: HS   Occupational History  . 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 Smoking status: Never Smoker  . Smokeless tobacco: Never Used  . Alcohol use 0.6 oz/week    1 Cans of beer per week     Comment: OCC  . Drug use: No  . Sexual activity: Not on file   Other Topics Concern  . Not on file   Social History Narrative   Patient lives at home with his wife Olin Hauser). Patient is retired. High school education.   Right handed.   Caffeine- one or two cups daily    Allergies: Tylox [oxycodone-acetaminophen]; Keppra [levetiracetam]; and Oxycodone  Medications: I have reviewed the patient's current medications.  Vital Signs: Patient Vitals for the past 24 hrs:  BP Temp Temp src Pulse Resp SpO2 Height Weight  07/23/17 0758 130/69 - - 99 20 95 % - -  07/23/17 0630 134/74 - - 89 20 97 % - -  07/23/17 0500 133/81 - - 84 20 95 % - -  07/23/17 0400 (!) 150/75 - - 86 14 94 % - -  07/23/17 0300 (!) 143/78 - - 92 20 94 % - -  07/23/17 0200 140/76 - - 83 18 100 % - -  07/23/17 0145 136/73 - - 91 19 94 % - -  07/23/17 0000 (!) 135/107 - - 93 17 93 % - -  07/22/17 2345 - - - (!) 32 - (!) 78 % - -  07/22/17 2330 (!) 148/69 - - 77 - 96 % - -  07/22/17 2315 - - - 81 - 96 % - -  07/22/17 2300 121/67 - - 87 - 100 % - -  07/22/17 2256 98/69 - - 86 18 93 % - -  07/22/17 2230 98/69 - - 84 - 91 % - -  07/22/17 2200 137/70 - - 79 - 91 % - -  07/22/17 2159 127/69 - - 82 19 92 % - -  07/22/17 2145 - - - 84 - 92 % - -  07/22/17 2130 127/69 - - 81 - 94 % - -  07/22/17 2119 119/70 - - 87 18 96 % - -  07/22/17 2033 - 100 F (37.8 C) Oral - - - - -  07/22/17 1938 134/69 - - 83 18 95 % - -  07/22/17 1650 - 99.7 F (37.6 C) Rectal - - - - -  07/22/17 1644 - - - - - - 6'  (1.829 m) 83.9 kg (185 lb)  07/22/17 1637 110/80 99.1 F (37.3 C) Oral 100 15 93 % - -    Radiology: Dg Chest 2 View  Result Date: 07/22/2017 CLINICAL DATA:  66 year old male with recent knee replacement. Fever and confusion. EXAM: CHEST  2 VIEW COMPARISON:  12/06/2016 and earlier. FINDINGS: Semi upright AP and lateral views of the chest. Stable to mildly increased cardiac silhouette. Other mediastinal contours are within normal limits. Visualized tracheal air column is within normal limits. No pneumothorax, pulmonary edema, pleural effusion or confluent pulmonary opacity. No acute osseous abnormality identified. Visible bowel gas pattern is similar an within normal limits. IMPRESSION: No acute cardiopulmonary abnormality. Electronically Signed   By: Genevie Ann M.D.   On: 07/22/2017 17:50   Dg Knee 2 Views Left  Result Date: 07/22/2017 CLINICAL DATA:  66 year old male status post knee replacement this week with continued pain. EXAM: LEFT KNEE - 1-2 VIEW COMPARISON:  None. FINDINGS: Generalized soft tissue swelling about the knee. No subcutaneous gas. No separate joint effusion is evident. Total knee arthroplasty hardware appears intact and normally aligned. The patella is intact with postoperative changes. No new osseous abnormality identified. IMPRESSION: 1. Generalized soft tissue swelling about the knee with no subcutaneous gas. 2. Left total knee arthroplasty changes with no unexpected bony finding. Electronically Signed   By: Genevie Ann M.D.   On: 07/22/2017 18:53   Ct Head Wo Contrast  Result Date: 07/22/2017 CLINICAL DATA:  Intermittent confusion following a knee replacement 4 days ago. EXAM: CT HEAD WITHOUT CONTRAST TECHNIQUE: Contiguous axial images were obtained from the base of the skull through the vertex without intravenous contrast. COMPARISON:  Brain MR dated 06/08/2016 and head CT dated 05/03/2016. FINDINGS: Brain: The ventricles and subarachnoid spaces remain mildly prominent. No  intracranial hemorrhage, mass lesion or CT evidence of acute infarction. Vascular: No hyperdense vessel or unexpected calcification. Skull: Normal. Negative for fracture or focal lesion. Sinuses/Orbits: Unremarkable. Other: None. IMPRESSION: No acute  abnormality. Stable mild diffuse cerebral and cerebellar atrophy. Electronically Signed   By: Claudie Revering M.D.   On: 07/22/2017 18:02    Labs:  Recent Labs  07/22/17 1651 07/23/17 0557  WBC 9.8 9.7  RBC 3.36* 2.79*  HCT 31.0* 25.8*  PLT 154 164    Recent Labs  07/22/17 1651 07/23/17 0557  NA 135 139  K 4.2 4.1  CL 104 109  CO2 22 22  BUN 28* 22*  CREATININE 1.65* 1.41*  GLUCOSE 121* 109*  CALCIUM 8.5* 8.1*    Recent Labs  07/22/17 2230  INR 1.52    Review of Systems: ROS  Physical Exam: Leg is warm to the touch LLE Mild edema No erythema noted Knee is wrapped s/p knee surgery 9/24  Assessment and Plan: Recommend Doppler LLE to r/o DVT PT eval 3 doses of Ancef and then may stop for possible cellulitis Dr [name] evaluated pt and contacted Dr. Elmyra Ricks To make him aware  Ronette Deter, [place] for [facility] [employer], MD [place] [phone]

## 2017-07-23 NOTE — ED Notes (Signed)
Please call Briana-RN [phone] for report at 1123am.

## 2017-07-23 NOTE — ED Notes (Signed)
Dr. Maylene Roes has just seen him and examined his left knee incision. The entire left knee is mildly edematous and erythematous, with no particularly overly warm areas palpable and no visible fluctuant/purulent areas. As I write this, he is enjoying his breakfast.

## 2017-07-23 NOTE — Progress Notes (Signed)
PROGRESS NOTE    [name]  GUY:[id number] DOB: 1951-06-17 DOA: 07/22/2017 PCP: [name], MD     Brief Narrative:  Kenneth Ortega is a 66 y.o. male with medical history significant of hypertension, hyperlipidemia, depression, absent seizure, left renal cell cancer (s/p of nephrectomy), prostatitis, OSA on CPAP, CKD-3, who presents with confusion. Patient had left knee replacement surgery by Dr. [name] on 07/17/17. Pt is currently on xarelto for DVT PPx. Per his wife, pt is very weak, and becomes confused. Pt has worsening pain in the left knee. He denies fever and chills. Wife reports that pt fell yesterday and hit the back of his head. Wife reports that pt had a similar episode after a previous surgery where he had a seizure after surgery. She reports this is similar. CT head negative for acute intracranial abnormalities. X-ray of left kidney showed soft tissue swelling. Patient was admitted for further evaluation, orthopedic surgery was consulted.  Assessment & Plan:   Principal Problem:   Acute metabolic encephalopathy Active Problems:   Depression   Acute renal failure superimposed on stage 3 chronic kidney disease (HCC)   Essential hypertension, malignant   Dyslipidemia   Seizure (HCC)   OSA on CPAP   History of left knee replacement   Acute metabolic encephalopathy -Unclear etiology, could be secondary to pain medication side effects, infection versus seizure -CT head: no acute abnormality -Decrease pain medication regimen -Neuro checks   Questionable left knee cellulitis status post left knee replacement -Orthopedic surgery consulted -Xray: Generalized soft tissue swelling about the knee with no subcutaneous gas -IV Ancef -PT eval  -DVT US pending   Absence seizure -Lamictal level ordered and pending -Continue Lamictal  AKI on CKD stage 3 -Baseline Cr 1.3 -IVF -Improving   Normocytic anemia -Monitor CBC  OSA -CPAP qhs   Depression -Continue  prozac   HLD -Continue zocor    DVT prophylaxis: subq hep (was on xarelto at home s/p knee sx)  Code Status: Full Family Communication: No family at bedside Disposition Plan: Pending improvement, back home   Consultants:   Orthopedic surgery  Procedures:   None  Antimicrobials:  Anti-infectives    Start     Dose/Rate Route Frequency Ordered Stop   07/22/17 2230  ceFAZolin (ANCEF) IVPB 1 g/50 mL premix  Status:  Discontinued     1 g 100 mL/hr over 30 Minutes Intravenous  Once 07/22/17 2217 07/22/17 2219   07/22/17 2230  ceFAZolin (ANCEF) IVPB 1 g/50 mL premix     1 g 100 mL/hr over 30 Minutes Intravenous Every 6 hours 07/22/17 2219 07/23/17 1302       Subjective: Patient feeling well. He complains of left knee pain and swelling that has gotten progressively worse since day after surgery. He does not recall exactly what happened prior to admission to the hospital. He states that his wife thought he was acting funny. Currently, he is feeling well, hungry. He denies any fevers or chills at home, no chest pain or shortness of breath, no nausea or vomiting or diarrhea or abdominal pain. Aside from his left knee pain, he has no other complaints.  Objective: Vitals:   07/23/17 1215 07/23/17 1230 07/23/17 1258 07/23/17 1428  BP:   (!) 140/93 131/73  Pulse: 90 (!) 101 90 86  Resp: 20 (!) 28 20 20   Temp:   98.2 F (36.8 C) 98.3 F (36.8 C)  TempSrc:   Oral Oral  SpO2: 95% 94% 97% 98%  Weight:  89.8 kg (197 lb 15.6 oz)   Height:   5\' 10"  (1.778 m)     Intake/Output Summary (Last 24 hours) at 07/23/17 1515 Last data filed at 07/23/17 0938  Gross per 24 hour  Intake             3050 ml  Output             1200 ml  Net             1850 ml   Filed Weights   07/22/17 1644 07/23/17 1258  Weight: 83.9 kg (185 lb) 89.8 kg (197 lb 15.6 oz)    Examination:  General exam: Appears calm and comfortable  Respiratory system: Clear to auscultation. Respiratory effort  normal. Cardiovascular system: S1 & S2 heard, RRR. No JVD, murmurs, rubs, gallops or clicks. No pedal edema. Gastrointestinal system: Abdomen is nondistended, soft and nontender. No organomegaly or masses felt. Normal bowel sounds heard. Central nervous system: Alert and oriented x3. No focal neurological deficits.  Extremities: Symmetric, left knee with clean incision and dressing, some edema and mild erythema Skin: No rashes, lesions or ulcers Psychiatry: Judgement and insight appear normal. Mood & affect appropriate.   Data Reviewed: I have personally reviewed following labs and imaging studies  CBC:  Recent Labs Lab 07/19/17 0533 07/20/17 0534 07/22/17 1651 07/23/17 0557  WBC 15.6* 13.6* 9.8 9.7  NEUTROABS  --   --  7.3  --   HGB 11.9* 10.5* 10.3* 8.6*  HCT 35.3* 31.9* 31.0* 25.8*  MCV 92.7 91.4 92.3 92.5  PLT 178 151 154 716   Basic Metabolic Panel:  Recent Labs Lab 07/19/17 0533 07/20/17 0534 07/22/17 1651 07/23/17 0557  NA 137 140 135 139  K 4.6 4.3 4.2 4.1  CL 105 109 104 109  CO2 25 25 22 22   GLUCOSE 154* 126* 121* 109*  BUN 20 21* 28* 22*  CREATININE 1.33* 1.31* 1.65* 1.41*  CALCIUM 8.5* 8.6* 8.5* 8.1*   GFR: Estimated Creatinine Clearance: 56.5 mL/min (A) (by C-G formula based on SCr of 1.41 mg/dL (H)). Liver Function Tests:  Recent Labs Lab 07/22/17 1651  AST 30  ALT 26  ALKPHOS 104  BILITOT 0.4  PROT 6.2*  ALBUMIN 3.2*   No results for input(s): LIPASE, AMYLASE in the last 168 hours. No results for input(s): AMMONIA in the last 168 hours. Coagulation Profile:  Recent Labs Lab 07/22/17 2230  INR 1.52   Cardiac Enzymes: No results for input(s): CKTOTAL, CKMB, CKMBINDEX, TROPONINI in the last 168 hours. BNP (last 3 results) No results for input(s): PROBNP in the last 8760 hours. HbA1C: No results for input(s): HGBA1C in the last 72 hours. CBG:  Recent Labs Lab 07/23/17 0758  GLUCAP 95   Lipid Profile: No results for input(s):  CHOL, HDL, LDLCALC, TRIG, CHOLHDL, LDLDIRECT in the last 72 hours. Thyroid Function Tests: No results for input(s): TSH, T4TOTAL, FREET4, T3FREE, THYROIDAB in the last 72 hours. Anemia Panel: No results for input(s): VITAMINB12, FOLATE, FERRITIN, TIBC, IRON, RETICCTPCT in the last 72 hours. Sepsis Labs:  Recent Labs Lab 07/22/17 1711 07/22/17 2230  PROCALCITON  --  <0.10  LATICACIDVEN 0.77 0.7    Recent Results (from the past 240 hour(s))  Blood culture (routine x 2)     Status: None (Preliminary result)   Collection Time: 07/22/17  8:11 PM  Result Value Ref Range Status   Specimen Description   Final    BLOOD LEFT ANTECUBITAL Performed at [facility] [place] [address] [place], [place] [address]    Special Requests   Final    BOTTLES DRAWN AEROBIC AND ANAEROBIC Blood Culture adequate volume   Culture PENDING  Incomplete   Report Status PENDING  Incomplete       Radiology Studies: Dg Chest 2 View  Result Date: 07/22/2017 CLINICAL DATA:  66 year old male with recent knee replacement. Fever and confusion. EXAM: CHEST  2 VIEW COMPARISON:  12/06/2016 and earlier. FINDINGS: Semi upright AP and lateral views of the chest. Stable to mildly increased cardiac silhouette. Other mediastinal contours are within normal limits. Visualized tracheal air column is within normal limits. No pneumothorax, pulmonary edema, pleural effusion or confluent pulmonary opacity. No acute osseous abnormality identified. Visible bowel gas pattern is similar an within normal limits. IMPRESSION: No acute cardiopulmonary abnormality. Electronically Signed   By: Genevie Ann M.D.   On: 07/22/2017 17:50   Dg Knee 2 Views Left  Result Date: 07/22/2017 CLINICAL DATA:  66 year old male status post knee replacement this week with continued pain. EXAM: LEFT KNEE - 1-2 VIEW COMPARISON:  None. FINDINGS: Generalized soft tissue swelling about the knee. No subcutaneous gas. No separate joint effusion is evident. Total knee  arthroplasty hardware appears intact and normally aligned. The patella is intact with postoperative changes. No new osseous abnormality identified. IMPRESSION: 1. Generalized soft tissue swelling about the knee with no subcutaneous gas. 2. Left total knee arthroplasty changes with no unexpected bony finding. Electronically Signed   By: Genevie Ann M.D.   On: 07/22/2017 18:53   Ct Head Wo Contrast  Result Date: 07/22/2017 CLINICAL DATA:  Intermittent confusion following a knee replacement 4 days ago. EXAM: CT HEAD WITHOUT CONTRAST TECHNIQUE: Contiguous axial images were obtained from the base of the skull through the vertex without intravenous contrast. COMPARISON:  Brain MR dated 06/08/2016 and head CT dated 05/03/2016. FINDINGS: Brain: The ventricles and subarachnoid spaces remain mildly prominent. No intracranial hemorrhage, mass lesion or CT evidence of acute infarction. Vascular: No hyperdense vessel or unexpected calcification. Skull: Normal. Negative for fracture or focal lesion. Sinuses/Orbits: Unremarkable. Other: None. IMPRESSION: No acute abnormality. Stable mild diffuse cerebral and cerebellar atrophy. Electronically Signed   By: Claudie Revering M.D.   On: 07/22/2017 18:02      Scheduled Meds: . FLUoxetine  20 mg Oral Daily  . fluticasone  1 spray Each Nare Daily  . heparin subcutaneous  5,000 Units Subcutaneous Q8H  . lamoTRIgine  200 mg Oral BID  . senna-docusate  1 tablet Oral QHS  . simvastatin  20 mg Oral Daily  . tamsulosin  0.4 mg Oral Daily   Continuous Infusions: . sodium chloride 75 mL/hr at 07/23/17 0230     LOS: 1 day    Time spent: 40 minutes   Dessa Phi, DO [facility] [email or web address] Password [email or web address] 07/23/2017, 3:15 PM

## 2017-07-24 LAB — CBC WITH DIFFERENTIAL/PLATELET
BASOS ABS: 0 10*3/uL (ref 0.0–0.1)
Basophils Relative: 0 %
EOS PCT: 5 %
Eosinophils Absolute: 0.3 10*3/uL (ref 0.0–0.7)
HCT: 27 % — ABNORMAL LOW (ref 39.0–52.0)
Hemoglobin: 8.9 g/dL — ABNORMAL LOW (ref 13.0–17.0)
Lymphocytes Relative: 17 %
Lymphs Abs: 1.2 10*3/uL (ref 0.7–4.0)
MCH: 30.6 pg (ref 26.0–34.0)
MCHC: 33 g/dL (ref 30.0–36.0)
MCV: 92.8 fL (ref 78.0–100.0)
MONO ABS: 0.8 10*3/uL (ref 0.1–1.0)
MONOS PCT: 12 %
NEUTROS ABS: 4.4 10*3/uL (ref 1.7–7.7)
Neutrophils Relative %: 66 %
PLATELETS: 175 10*3/uL (ref 150–400)
RBC: 2.91 MIL/uL — ABNORMAL LOW (ref 4.22–5.81)
RDW: 12.3 % (ref 11.5–15.5)
WBC: 6.7 10*3/uL (ref 4.0–10.5)

## 2017-07-24 LAB — BASIC METABOLIC PANEL
Anion gap: 6 (ref 5–15)
BUN: 18 mg/dL (ref 6–20)
CALCIUM: 8.2 mg/dL — AB (ref 8.9–10.3)
CO2: 24 mmol/L (ref 22–32)
CREATININE: 1.28 mg/dL — AB (ref 0.61–1.24)
Chloride: 109 mmol/L (ref 101–111)
GFR calc Af Amer: >60 mL/min (ref >60)
GFR, EST NON AFRICAN AMERICAN: 56 mL/min — AB (ref >60)
Glucose, Bld: 123 mg/dL — ABNORMAL HIGH (ref 65–99)
Potassium: 4.2 mmol/L (ref 3.5–5.1)
Sodium: 139 mmol/L (ref 135–145)

## 2017-07-24 LAB — URINE CULTURE: CULTURE: NO GROWTH

## 2017-07-24 LAB — GLUCOSE, CAPILLARY: Glucose-Capillary: 114 mg/dL — ABNORMAL HIGH (ref 65–99)

## 2017-07-24 MED ORDER — CYCLOBENZAPRINE HCL 10 MG PO TABS: 10.0000 mg | ORAL_TABLET | Freq: Every day | ORAL | 0 refills | Status: DC | PRN

## 2017-07-24 NOTE — Evaluation (Addendum)
Physical Therapy Evaluation Patient Details Name: Kenneth Ortega MRN: [id number] DOB: 15-Apr-1951 Today's Date: 07/24/2017   History of Present Illness  Kenneth Ortega is a 66 y.o. male with medical history significant of hypertension, hyperlipidemia, depression, absent seizure, left renal cell cancer (s/p of nephrectomy), prostatitis, OSA on CPAP, CKD-3, who presents with confusion. Patient had left knee replacement surgery by Dr. Wynelle Link on 07/17/17.  Clinical Impression  Pt admitted with above diagnosis. Pt currently with functional limitations due to the deficits listed below (see PT Problem List).  Pt will benefit from skilled PT to increase their independence and safety with mobility to allow discharge to the venue listed below.  Pt with decreased memory and didn't remember going home after L TKA.  Recommend HHPT for short period of time before returning to outpatient PT.      Follow Up Recommendations Home health PT    Equipment Recommendations  None recommended by PT    Recommendations for Other Services       Precautions / Restrictions Precautions Precautions: Knee Restrictions Other Position/Activity Restrictions: WBAT       Mobility  Bed Mobility               General bed mobility comments: Pt up in chair upon arrival  Transfers Overall transfer level: Needs assistance Equipment used: Rolling walker (2 wheeled) Transfers: Sit to/from Stand Sit to Stand: Min guard         General transfer comment: cues for hand placement  Ambulation/Gait Ambulation/Gait assistance: Min guard Ambulation Distance (Feet): 75 Feet Assistive device: Rolling walker (2 wheeled) Gait Pattern/deviations: Step-through pattern;Trunk flexed;Decreased stance time - left     General Gait Details: Cues for posture and for technique. No buckeling noted.  Stairs Stairs: Yes Stairs assistance: Min assist Stair Management: One rail Left;Sideways Number of Stairs: 3 General stair  comments: Educated on sideways technique and performed with MIN A with wife present.  Wheelchair Mobility    Modified Rankin (Stroke Patients Only)       Balance Overall balance assessment: Needs assistance   Sitting balance-Leahy Scale: Good       Standing balance-Leahy Scale: Poor Standing balance comment: requires RW                             Pertinent Vitals/Pain Pain Assessment: No/denies pain Pain Score: 5  Pain Location: L knee  Pain Descriptors / Indicators: Aching;Tightness;Sore Pain Intervention(s): Ice applied;Limited activity within patient's tolerance;Monitored during session;Repositioned    Home Living Family/patient expects to be discharged to:: Private residence Living Arrangements: Spouse/significant other Available Help at Discharge: Family Type of Home: House Home Access: Stairs to enter Entrance Stairs-Rails: Right Entrance Stairs-Number of Steps: 10 Home Layout: One level Home Equipment: Environmental consultant - 2 wheels;Bedside commode;Cane - single point;Crutches;Wheelchair - manual      Prior Function Level of Independence: Independent         Comments: prior to TKA, since surgery has declined at home and only made it to outpatient PT once and wife needed fire department to A her getting him in and out of car and into house.     Hand Dominance        Extremity/Trunk Assessment   Upper Extremity Assessment Upper Extremity Assessment: Overall WFL for tasks assessed    Lower Extremity Assessment Lower Extremity Assessment: LLE deficits/detail LLE Deficits / Details: able to perform SLR, fair quad contraction, L knee AAROM 90* sitting  Cervical / Trunk Assessment Cervical / Trunk Assessment: Normal  Communication   Communication: No difficulties  Cognition Arousal/Alertness: Awake/alert Behavior During Therapy: WFL for tasks assessed/performed Overall Cognitive Status: Impaired/Different from baseline Area of Impairment:  Memory;Following commands;Problem solving                     Memory: Decreased short-term memory Following Commands: Follows one step commands consistently     Problem Solving: Slow processing;Difficulty sequencing;Requires verbal cues;Requires tactile cues        General Comments General comments (skin integrity, edema, etc.): L LE tightness and swollen.  Ice applied    Exercises Total Joint Exercises Quad Sets: AROM;Left;10 reps Heel Slides: AAROM;Left;10 reps Hip ABduction/ADduction: AAROM;Left;10 reps Straight Leg Raises: Left;5 reps Long Arc Quad: Left;10 reps   Assessment/Plan    PT Assessment Patient needs continued PT services  PT Problem List Decreased strength;Decreased mobility;Decreased range of motion;Decreased knowledge of use of DME;Decreased balance;Decreased safety awareness       PT Treatment Interventions DME instruction;Gait training;Stair training;Functional mobility training;Balance training;Therapeutic exercise;Therapeutic activities;Patient/family education    PT Goals (Current goals can be found in the Care Plan section)  Acute Rehab PT Goals Patient Stated Goal: return home  PT Goal Formulation: With patient Time For Goal Achievement: 07/31/17 Potential to Achieve Goals: Good    Frequency Min 5X/week   Barriers to discharge Inaccessible home environment 9 steps to enter home    Co-evaluation               AM-PAC PT "6 Clicks" Daily Activity  Outcome Measure Difficulty turning over in bed (including adjusting bedclothes, sheets and blankets)?: A Little Difficulty moving from lying on back to sitting on the side of the bed? : A Little Difficulty sitting down on and standing up from a chair with arms (e.g., wheelchair, bedside commode, etc,.)?: A Little Help needed moving to and from a bed to chair (including a wheelchair)?: A Little Help needed walking in hospital room?: A Little Help needed climbing 3-5 steps with a railing?  : A Little 6 Click Score: 18    End of Session Equipment Utilized During Treatment: Gait belt Activity Tolerance: Patient tolerated treatment well Patient left: in chair;with call bell/phone within reach;with family/visitor present Nurse Communication: Mobility status PT Visit Diagnosis: Difficulty in walking, not elsewhere classified (R26.2);Pain Pain - Right/Left: Left Pain - part of body: Knee    Time: 9924-2683 PT Time Calculation (min) (ACUTE ONLY): 38 min   Charges:   PT Evaluation $PT Eval Moderate Complexity: 1 Mod PT Treatments $Gait Training: 8-22 mins $Therapeutic Exercise: 8-22 mins   PT G Codes:        Dameshia Seybold L. Tamala Julian, [place] Pager [phone] 07/24/2017   Galen Manila 07/24/2017, 12:27 PM

## 2017-07-24 NOTE — Progress Notes (Addendum)
Subjective:   6 days s/p L TKA Patient reports pain as moderate.  Reports he is moving the leg much better today than yesterday. He seems unsure of the events leading back up to rehospitalization. He does not recall a head injury.  Objective: Vital signs in last 24 hours: Temp:  [98.1 F (36.7 C)-98.7 F (37.1 C)] 98.1 F (36.7 C) (09/30 0615) Pulse Rate:  [80-108] 81 (09/30 0615) Resp:  [17-34] 18 (09/30 0615) BP: (127-161)/(66-93) 140/81 (09/30 0615) SpO2:  [93 %-98 %] 96 % (09/30 0615) Weight:  [89.8 kg (197 lb 15.6 oz)] 89.8 kg (197 lb 15.6 oz) (09/29 1258)  Intake/Output from previous day: 09/29 0701 - 09/30 0700 In: 1837.5 [I.V.:1837.5] Out: 1800 [Urine:1800] Intake/Output this shift: Total I/O In: -  Out: 500 [Urine:500]   Recent Labs  07/22/17 1651 07/23/17 0557 07/24/17 0511  HGB 10.3* 8.6* 8.9*    Recent Labs  07/23/17 0557 07/24/17 0511  WBC 9.7 6.7  RBC 2.79* 2.91*  HCT 25.8* 27.0*  PLT 164 175    Recent Labs  07/23/17 0557 07/24/17 0511  NA 139 139  K 4.1 4.2  CL 109 109  CO2 22 24  BUN 22* 18  CREATININE 1.41* 1.28*  GLUCOSE 109* 123*  CALCIUM 8.1* 8.2*    Recent Labs  07/22/17 2230  INR 1.52    Neurologically intact ABD soft Neurovascular intact Sensation intact distally Intact pulses distally Dorsiflexion/Plantar flexion intact Incision: dressing C/D/I and no drainage No cellulitis present Compartment soft no sign of DVT  Mild Swelling and ecchymosis at the knee to be expected this soon post-op No erythema or sign of infx  Assessment/Plan:    Advance diet Up with therapy D/C IV fluids  WBAT DVT ppx- heparin here. Was on xarelto at home and should resume upon d/c Disposition per admitting service. Orthopedically stable.  BISSELL, JACLYN M. 07/24/2017, 9:17 AM

## 2017-07-24 NOTE — Care Management Note (Addendum)
Case Management Note  Patient Details  Name: Kenneth Ortega MRN: [id number] Date of Birth: [birth year]  Subjective/Objective:    S/p L TKA on 07/18/2017, readmission, Acute encephalopathy falls                 Action/Plan: Discharge Planning: NCM spoke to pt and wife at bedside. Offered choice for HH/list provided. Pt agreeable to [facility]. Contacted [facility] with new referral. Pt reported having RW and 3n1 bedside commode at home.  Address [address], [place] [address]  PCP [name] MD   Expected Discharge Date:  07/24/17               Expected Discharge Plan:  [place]  In-House Referral:  NA  Discharge planning Services  CM Consult  Post Acute Care Choice:  Home Health Choice offered to:  Spouse, Patient  DME Arranged:  N/A DME Agency:  NA  HH Arranged:  PT [place] Agency:  [facility] ([facility]  Status of Service:  Completed, signed off  If discussed at [employer], dates discussed:    Additional Comments:  Erenest Rasher, RN 07/24/2017, 1:53 PM

## 2017-07-24 NOTE — Discharge Summary (Signed)
Physician Discharge Summary  Kenneth Ortega PPJ:[id number] DOB: 11-11-1950 DOA: 07/22/2017  PCP: [name], MD  Admit date: 07/22/2017 Discharge date: 07/24/2017  Admitted From: Home Disposition:  Home  Recommendations for Outpatient Follow-up:  1. Follow up with PCP in 1 week 2. Follow up with Orthopedic surgery 3. Please obtain BMP/CBC in 1 week  4. Please follow up on the following pending results: Final blood culture result, negative at the of discharge. Lamictal level was ordered, pending at time of discharge.  Home Health: PT   Equipment/Devices: None   Discharge Condition: Stable CODE STATUS: Full  Diet recommendation: Heart healthy   Brief/Interim Summary: Kenneth Ortega Thorneris a 66 y.o.malewith medical history significant of hypertension, hyperlipidemia, depression, absence seizure, left renal cell cancer (s/p of nephrectomy), prostatitis, OSA on CPAP, CKD-3, who presents with confusion. Patient had left knee replacement surgery by Dr. Cherylann Banas 07/17/17. Pt is currently on xarelto for DVT PPx. Per his wife, pt is very weak, and becomes confused. Pt has worsening pain in the left knee. He had apparently been taking Dilaudid 2 mg every 4 hours as well as Flexeril 10 mg 3 times daily since postoperatively. Wife reported that patient has been very somnolent, weak, unable to stand. He was evaluated by physical therapy but was unable to participate actively. At home, wife had a very difficult time taking care of patient and patient was brought to the hospital. CT head negative for acute intracranial abnormalities. X-ray of left kidney showed soft tissue swelling. Patient was admitted for further evaluation, orthopedic surgery was consulted.  He was initially given IV Ancef for concern for left knee cellulitis status post left knee replacement. He was given 3 doses total. Ultrasound of the left extremity was negative for DVT. He did not have any erythema overlying his left knee and  cellulitis was less likely. Orthopedic surgery did evaluate patient while in the hospital. Patient's acute encephalopathy was most likely secondary to pain medication overuse. Wife said that patient was taking Dilaudid 2 mg every 4 hours as well as Flexeril 10 mg 3 times daily and became confused, sleepy and somnolent, weak. This is discussed at length with the wife over the phone. We discussed that he should not take any more Dilaudid. She does also have a prescription for hydrocodone at home. I discussed that he should only use 1 tablet every 6 hours as needed for severe breakthrough pain. Flexeril will also be decreased to once daily as needed.  Discharge Diagnoses:  Principal Problem:   Acute metabolic encephalopathy Active Problems:   Depression   Acute renal failure superimposed on stage 3 chronic kidney disease (HCC)   Essential hypertension, malignant   Dyslipidemia   Seizure (HCC)   OSA on CPAP   History of left knee replacement   Acute encephalopathy -Secondary to pain medication overuse -CT head: no acute abnormality -Decrease pain medication regimen -Stable   Status post left knee replacement -Orthopedic surgery consulted -Xray: Generalized soft tissue swelling about the knee with no subcutaneous gas -IV Ancef given for questionable cellulitis  -DVT US negative for DVT -PT eval recommending HHPT -No further concern for infectious etiology. Antibiotics stopped. Follow-up with orthopedic surgery as outpatient.  Absence seizure -Lamictal level ordered and pending -Continue Lamictal -Patient's presentation less likely to be consistent with seizure etiology. Follow with Dr. Jannifer Franklin as needed  AKI on CKD stage 3 -Baseline Cr 1.3 -Resolved   Normocytic anemia -Stable   OSA -CPAP qhs   Depression -Continue prozac  HLD -Continue zocor   Discharge Instructions  Discharge Instructions    Call MD for:  difficulty breathing, headache or visual disturbances     Complete by:  As directed    Call MD for:  extreme fatigue    Complete by:  As directed    Call MD for:  hives    Complete by:  As directed    Call MD for:  persistant dizziness or light-headedness    Complete by:  As directed    Call MD for:  persistant nausea and vomiting    Complete by:  As directed    Call MD for:  severe uncontrolled pain    Complete by:  As directed    Call MD for:  temperature >100.4    Complete by:  As directed    Diet - low sodium heart healthy    Complete by:  As directed    Discharge instructions    Complete by:  As directed    You were cared for by a hospitalist during your hospital stay. If you have any questions about your discharge medications or the care you received while you were in the hospital after you are discharged, you can call the unit and asked to speak with the hospitalist on call if the hospitalist that took care of you is not available. Once you are discharged, your primary care physician will handle any further medical issues. Please note that NO REFILLS for any discharge medications will be authorized once you are discharged, as it is imperative that you return to your primary care physician (or establish a relationship with a primary care physician if you do not have one) for your aftercare needs so that they can reassess your need for medications and monitor your lab values.   Increase activity slowly    Complete by:  As directed      Allergies as of 07/24/2017      Reactions   Tylox [oxycodone-acetaminophen] Other (See Comments)   Unknown   Keppra [levetiracetam] Other (See Comments)   Patient unsure of reaction   Oxycodone Other (See Comments)   hallucinations       Medication List    STOP taking these medications   HYDROmorphone 2 MG tablet Commonly known as:  DILAUDID   zolpidem 10 MG tablet Commonly known as:  AMBIEN     TAKE these medications   BENICAR 5 MG tablet Generic drug:  olmesartan Take 5 mg by mouth  daily.   cyclobenzaprine 10 MG tablet Commonly known as:  FLEXERIL Take 1 tablet (10 mg total) by mouth daily as needed for muscle spasms. What changed:  when to take this   FLUoxetine 20 MG capsule Commonly known as:  PROZAC Take 20 mg by mouth daily.   lamoTRIgine 200 MG tablet Commonly known as:  LAMICTAL Take 1 tablet (200 mg total) by mouth 2 (two) times daily.   mometasone 50 MCG/ACT nasal spray Commonly known as:  NASONEX Place 2 sprays into the nose daily as needed (for allergies).   rivaroxaban 10 MG Tabs tablet Commonly known as:  XARELTO Take 1 tablet (10 mg total) by mouth daily with breakfast. Notes to patient:  Take in the morning starting tomorrow 07/25/17.  Take with a meal when you get home today 07/24/17.   simvastatin 20 MG tablet Commonly known as:  ZOCOR Take 20 mg by mouth daily.   tamsulosin 0.4 MG Caps capsule Commonly known as:  FLOMAX Take 0.4 mg by  mouth daily.            Discharge Care Instructions        Start     Ordered   07/24/17 0000  cyclobenzaprine (FLEXERIL) 10 MG tablet  Daily PRN     07/24/17 1232   07/24/17 0000  Increase activity slowly     07/24/17 1232   07/24/17 0000  Diet - low sodium heart healthy     07/24/17 1232   07/24/17 0000  Discharge instructions    Comments:  You were cared for by a hospitalist during your hospital stay. If you have any questions about your discharge medications or the care you received while you were in the hospital after you are discharged, you can call the unit and asked to speak with the hospitalist on call if the hospitalist that took care of you is not available. Once you are discharged, your primary care physician will handle any further medical issues. Please note that NO REFILLS for any discharge medications will be authorized once you are discharged, as it is imperative that you return to your primary care physician (or establish a relationship with a primary care physician if you do not  have one) for your aftercare needs so that they can reassess your need for medications and monitor your lab values.   07/24/17 1232   07/24/17 0000  Call MD for:  extreme fatigue     07/24/17 1232   07/24/17 0000  Call MD for:  persistant dizziness or light-headedness     07/24/17 1232   07/24/17 0000  Call MD for:  hives     07/24/17 1232   07/24/17 0000  Call MD for:  difficulty breathing, headache or visual disturbances     07/24/17 1232   07/24/17 0000  Call MD for:  severe uncontrolled pain     07/24/17 1232   07/24/17 0000  Call MD for:  persistant nausea and vomiting     07/24/17 1232   07/24/17 0000  Call MD for:  temperature >100.4     07/24/17 1232     Follow-up Information    [name], MD. Schedule an appointment as soon as possible for a visit in 1 week(s).   Specialty:  Internal Medicine Contact information: [place] [address] [phone]        Gaynelle Arabian, MD. Schedule an appointment as soon as possible for a visit in 1 week(s).   Specialty:  Orthopedic Surgery Contact information: [address] [place] [address] [phone]        [facility] Follow up.   Specialty:  Home Health Services Why:  Home Health Physical Therapy- agency will call you to arrange intial visit Contact information: [address] [place]  [address] [phone]          Allergies  Allergen Reactions  . Tylox [Oxycodone-Acetaminophen] Other (See Comments)    Unknown  . Keppra [Levetiracetam] Other (See Comments)    Patient unsure of reaction  . Oxycodone Other (See Comments)    hallucinations     Consultations:  Orthopedic surgery    Procedures/Studies: Dg Chest 2 View  Result Date: 07/22/2017 CLINICAL DATA:  66 year old male with recent knee replacement. Fever and confusion. EXAM: CHEST  2 VIEW COMPARISON:  12/06/2016 and earlier. FINDINGS: Semi upright AP and lateral views of the  chest. Stable to mildly increased cardiac silhouette. Other mediastinal contours are within normal limits. Visualized  tracheal air column is within normal limits. No pneumothorax, pulmonary edema, pleural effusion or confluent pulmonary opacity. No acute osseous abnormality identified. Visible bowel gas pattern is similar an within normal limits. IMPRESSION: No acute cardiopulmonary abnormality. Electronically Signed   By: Genevie Ann M.D.   On: 07/22/2017 17:50   Dg Knee 2 Views Left  Result Date: 07/22/2017 CLINICAL DATA:  66 year old male status post knee replacement this week with continued pain. EXAM: LEFT KNEE - 1-2 VIEW COMPARISON:  None. FINDINGS: Generalized soft tissue swelling about the knee. No subcutaneous gas. No separate joint effusion is evident. Total knee arthroplasty hardware appears intact and normally aligned. The patella is intact with postoperative changes. No new osseous abnormality identified. IMPRESSION: 1. Generalized soft tissue swelling about the knee with no subcutaneous gas. 2. Left total knee arthroplasty changes with no unexpected bony finding. Electronically Signed   By: Genevie Ann M.D.   On: 07/22/2017 18:53   Ct Head Wo Contrast  Result Date: 07/22/2017 CLINICAL DATA:  Intermittent confusion following a knee replacement 4 days ago. EXAM: CT HEAD WITHOUT CONTRAST TECHNIQUE: Contiguous axial images were obtained from the base of the skull through the vertex without intravenous contrast. COMPARISON:  Brain MR dated 06/08/2016 and head CT dated 05/03/2016. FINDINGS: Brain: The ventricles and subarachnoid spaces remain mildly prominent. No intracranial hemorrhage, mass lesion or CT evidence of acute infarction. Vascular: No hyperdense vessel or unexpected calcification. Skull: Normal. Negative for fracture or focal lesion. Sinuses/Orbits: Unremarkable. Other: None. IMPRESSION: No acute abnormality. Stable mild diffuse cerebral and cerebellar atrophy. Electronically Signed   By: Claudie Revering M.D.   On: 07/22/2017 18:02      Discharge Exam: Vitals:   07/23/17 2114 07/24/17 0615  BP: 127/66 140/81  Pulse: 89 81  Resp: 18 18  Temp: 98.7 F (37.1 C) 98.1 F (36.7 C)  SpO2: 95% 96%   Vitals:   07/23/17 1258 07/23/17 1428 07/23/17 2114 07/24/17 0615  BP: (!) 140/93 131/73 127/66 140/81  Pulse: 90 86 89 81  Resp: 20 20 18 18   Temp: 98.2 F (36.8 C) 98.3 F (36.8 C) 98.7 F (37.1 C) 98.1 F (36.7 C)  TempSrc: Oral Oral Oral Oral  SpO2: 97% 98% 95% 96%  Weight: 89.8 kg (197 lb 15.6 oz)     Height: 5\' 10"  (1.778 m)       General: Pt is alert, awake, not in acute distress Cardiovascular: RRR, S1/S2 +, no rubs, no gallops Respiratory: CTA bilaterally, no wheezing, no rhonchi Abdominal: Soft, NT, ND, bowel sounds + Extremities: +edema and bruising of left knee, no erythema, no cyanosis    The results of significant diagnostics from this hospitalization (including imaging, microbiology, ancillary and laboratory) are listed below for reference.     Microbiology: Recent Results (from the past 240 hour(s))  Urine culture     Status: None   Collection Time: 07/22/17  5:24 PM  Result Value Ref Range Status   Specimen Description URINE, CLEAN CATCH  Final   Special Requests NONE  Final   Culture   Final    NO GROWTH Performed at [facility], [address], [place], [place] [address]    Report Status 07/24/2017 FINAL  Final  Blood culture (routine x 2)     Status: None (Preliminary result)   Collection Time: 07/22/17  6:11 PM  Result Value Ref Range Status   Specimen Description BLOOD RIGHT ANTECUBITAL  Final   Special Requests   Final  BOTTLES DRAWN AEROBIC AND ANAEROBIC Blood Culture adequate volume   Culture   Final    NO GROWTH 1 DAY Performed at [facility], [place] [address], [place], [place] [address]    Report Status PENDING  Incomplete  Blood culture (routine x 2)     Status: None (Preliminary result)   Collection Time: 07/22/17   8:11 PM  Result Value Ref Range Status   Specimen Description BLOOD LEFT ANTECUBITAL  Final   Special Requests   Final    BOTTLES DRAWN AEROBIC AND ANAEROBIC Blood Culture adequate volume   Culture   Final    NO GROWTH 1 DAY Performed at [facility], [place] [address], [place], [place] [address]    Report Status PENDING  Incomplete     Labs: BNP (last 3 results) No results for input(s): BNP in the last 8760 hours. Basic Metabolic Panel:  Recent Labs Lab 07/19/17 0533 07/20/17 0534 07/22/17 1651 07/23/17 0557 07/24/17 0511  NA 137 140 135 139 139  K 4.6 4.3 4.2 4.1 4.2  CL 105 109 104 109 109  CO2 25 25 22 22 24   GLUCOSE 154* 126* 121* 109* 123*  BUN 20 21* 28* 22* 18  CREATININE 1.33* 1.31* 1.65* 1.41* 1.28*  CALCIUM 8.5* 8.6* 8.5* 8.1* 8.2*   Liver Function Tests:  Recent Labs Lab 07/22/17 1651  AST 30  ALT 26  ALKPHOS 104  BILITOT 0.4  PROT 6.2*  ALBUMIN 3.2*   No results for input(s): LIPASE, AMYLASE in the last 168 hours. No results for input(s): AMMONIA in the last 168 hours. CBC:  Recent Labs Lab 07/19/17 0533 07/20/17 0534 07/22/17 1651 07/23/17 0557 07/24/17 0511  WBC 15.6* 13.6* 9.8 9.7 6.7  NEUTROABS  --   --  7.3  --  4.4  HGB 11.9* 10.5* 10.3* 8.6* 8.9*  HCT 35.3* 31.9* 31.0* 25.8* 27.0*  MCV 92.7 91.4 92.3 92.5 92.8  PLT 178 151 154 164 175   Cardiac Enzymes: No results for input(s): CKTOTAL, CKMB, CKMBINDEX, TROPONINI in the last 168 hours. BNP: Invalid input(s): POCBNP CBG:  Recent Labs Lab 07/23/17 0758 07/24/17 0729  GLUCAP 95 114*   D-Dimer No results for input(s): DDIMER in the last 72 hours. Hgb A1c No results for input(s): HGBA1C in the last 72 hours. Lipid Profile No results for input(s): CHOL, HDL, LDLCALC, TRIG, CHOLHDL, LDLDIRECT in the last 72 hours. Thyroid function studies No results for input(s): TSH, T4TOTAL, T3FREE, THYROIDAB in the last 72 hours.  Invalid input(s): FREET3 Anemia work up No  results for input(s): VITAMINB12, FOLATE, FERRITIN, TIBC, IRON, RETICCTPCT in the last 72 hours. Urinalysis    Component Value Date/Time   COLORURINE YELLOW 07/22/2017 [place] 07/22/2017 1724   LABSPEC 1.019 07/22/2017 1724   PHURINE 5.0 07/22/2017 1724   GLUCOSEU NEGATIVE 07/22/2017 1724   HGBUR NEGATIVE 07/22/2017 1724   BILIRUBINUR NEGATIVE 07/22/2017 1724   KETONESUR NEGATIVE 07/22/2017 1724   PROTEINUR 30 (A) 07/22/2017 1724   UROBILINOGEN 0.2 07/19/2007 0851   NITRITE NEGATIVE 07/22/2017 1724   LEUKOCYTESUR NEGATIVE 07/22/2017 1724   Sepsis Labs Invalid input(s): PROCALCITONIN,  WBC,  LACTICIDVEN Microbiology Recent Results (from the past 240 hour(s))  Urine culture     Status: None   Collection Time: 07/22/17  5:24 PM  Result Value Ref Range Status   Specimen Description URINE, CLEAN CATCH  Final   Special Requests NONE  Final   Culture   Final  NO GROWTH Performed at [facility], [place] [address] [place], [place] [address]    Report Status 07/24/2017 FINAL  Final  Blood culture (routine x 2)     Status: None (Preliminary result)   Collection Time: 07/22/17  6:11 PM  Result Value Ref Range Status   Specimen Description BLOOD RIGHT ANTECUBITAL  Final   Special Requests   Final    BOTTLES DRAWN AEROBIC AND ANAEROBIC Blood Culture adequate volume   Culture   Final    NO GROWTH 1 DAY Performed at [facility], [place] [address] [place], [place] [address]    Report Status PENDING  Incomplete  Blood culture (routine x 2)     Status: None (Preliminary result)   Collection Time: 07/22/17  8:11 PM  Result Value Ref Range Status   Specimen Description BLOOD LEFT ANTECUBITAL  Final   Special Requests   Final    BOTTLES DRAWN AEROBIC AND ANAEROBIC Blood Culture adequate volume   Culture   Final    NO GROWTH 1 DAY Performed at [facility], [place] [address] [place], [place] [address]    Report Status PENDING  Incomplete     Time  coordinating discharge: 40 minutes  SIGNED:  Dessa Phi, DO [facility] Pager [phone]  If 7PM-7AM, please contact night-coverage [email or web address] Password [email or web address] 07/24/2017, 3:53 PM   c

## 2017-07-24 NOTE — Care Management Important Message (Signed)
Important Message  Patient Details  Name: Kenneth Ortega MRN: [id number] Date of Birth: 27-Jun-1951   Medicare Important Message Given:  Yes (signed copy)    Erenest Rasher, RN 07/24/2017, 2:13 PM

## 2017-07-25 ENCOUNTER — Telehealth: Payer: Self-pay | Admitting: Neurology

## 2017-07-25 DIAGNOSIS — N183 Chronic kidney disease, stage 3 (moderate): Secondary | ICD-10-CM | POA: Diagnosis not present

## 2017-07-25 DIAGNOSIS — C642 Malignant neoplasm of left kidney, except renal pelvis: Secondary | ICD-10-CM | POA: Diagnosis not present

## 2017-07-25 DIAGNOSIS — D500 Iron deficiency anemia secondary to blood loss (chronic): Secondary | ICD-10-CM | POA: Diagnosis not present

## 2017-07-25 DIAGNOSIS — G40209 Localization-related (focal) (partial) symptomatic epilepsy and epileptic syndromes with complex partial seizures, not intractable, without status epilepticus: Secondary | ICD-10-CM | POA: Diagnosis not present

## 2017-07-25 DIAGNOSIS — I129 Hypertensive chronic kidney disease with stage 1 through stage 4 chronic kidney disease, or unspecified chronic kidney disease: Secondary | ICD-10-CM | POA: Diagnosis not present

## 2017-07-25 DIAGNOSIS — Z471 Aftercare following joint replacement surgery: Secondary | ICD-10-CM | POA: Diagnosis not present

## 2017-07-25 LAB — LAMOTRIGINE LEVEL: Lamotrigine Lvl: 9.3 ug/mL (ref 2.0–20.0)

## 2017-07-25 NOTE — Telephone Encounter (Signed)
Patient's wife called office, he was admitted to the hospital for confusion, which was attributed to his medication use, He was seen in our office for absence seizure,  Wondering if he needs a follow up appointment sooner.

## 2017-07-25 NOTE — Telephone Encounter (Signed)
I called the wife. The patient was in the hospital following the surgery, he was taking Flexeril and Dilaudid on a regular basis. This resulted in confusion, he has been taken off the medication.  We can follow-up with the patient sooner if the confusion persists, no need to see him if he is back to baseline.

## 2017-07-26 DIAGNOSIS — G4700 Insomnia, unspecified: Secondary | ICD-10-CM | POA: Diagnosis not present

## 2017-07-26 DIAGNOSIS — M25562 Pain in left knee: Secondary | ICD-10-CM | POA: Diagnosis not present

## 2017-07-27 DIAGNOSIS — G40209 Localization-related (focal) (partial) symptomatic epilepsy and epileptic syndromes with complex partial seizures, not intractable, without status epilepticus: Secondary | ICD-10-CM | POA: Diagnosis not present

## 2017-07-27 DIAGNOSIS — D500 Iron deficiency anemia secondary to blood loss (chronic): Secondary | ICD-10-CM | POA: Diagnosis not present

## 2017-07-27 DIAGNOSIS — N183 Chronic kidney disease, stage 3 (moderate): Secondary | ICD-10-CM | POA: Diagnosis not present

## 2017-07-27 DIAGNOSIS — C642 Malignant neoplasm of left kidney, except renal pelvis: Secondary | ICD-10-CM | POA: Diagnosis not present

## 2017-07-27 DIAGNOSIS — I129 Hypertensive chronic kidney disease with stage 1 through stage 4 chronic kidney disease, or unspecified chronic kidney disease: Secondary | ICD-10-CM | POA: Diagnosis not present

## 2017-07-27 DIAGNOSIS — Z471 Aftercare following joint replacement surgery: Secondary | ICD-10-CM | POA: Diagnosis not present

## 2017-07-28 LAB — CULTURE, BLOOD (ROUTINE X 2)
CULTURE: NO GROWTH
Culture: NO GROWTH
Special Requests: ADEQUATE
Special Requests: ADEQUATE

## 2017-07-29 DIAGNOSIS — Z471 Aftercare following joint replacement surgery: Secondary | ICD-10-CM | POA: Diagnosis not present

## 2017-07-29 DIAGNOSIS — D500 Iron deficiency anemia secondary to blood loss (chronic): Secondary | ICD-10-CM | POA: Diagnosis not present

## 2017-07-29 DIAGNOSIS — I129 Hypertensive chronic kidney disease with stage 1 through stage 4 chronic kidney disease, or unspecified chronic kidney disease: Secondary | ICD-10-CM | POA: Diagnosis not present

## 2017-07-29 DIAGNOSIS — G40209 Localization-related (focal) (partial) symptomatic epilepsy and epileptic syndromes with complex partial seizures, not intractable, without status epilepticus: Secondary | ICD-10-CM | POA: Diagnosis not present

## 2017-07-29 DIAGNOSIS — N183 Chronic kidney disease, stage 3 (moderate): Secondary | ICD-10-CM | POA: Diagnosis not present

## 2017-07-29 DIAGNOSIS — C642 Malignant neoplasm of left kidney, except renal pelvis: Secondary | ICD-10-CM | POA: Diagnosis not present

## 2017-08-01 DIAGNOSIS — C642 Malignant neoplasm of left kidney, except renal pelvis: Secondary | ICD-10-CM | POA: Diagnosis not present

## 2017-08-01 DIAGNOSIS — D500 Iron deficiency anemia secondary to blood loss (chronic): Secondary | ICD-10-CM | POA: Diagnosis not present

## 2017-08-01 DIAGNOSIS — Z471 Aftercare following joint replacement surgery: Secondary | ICD-10-CM | POA: Diagnosis not present

## 2017-08-01 DIAGNOSIS — G40209 Localization-related (focal) (partial) symptomatic epilepsy and epileptic syndromes with complex partial seizures, not intractable, without status epilepticus: Secondary | ICD-10-CM | POA: Diagnosis not present

## 2017-08-01 DIAGNOSIS — I129 Hypertensive chronic kidney disease with stage 1 through stage 4 chronic kidney disease, or unspecified chronic kidney disease: Secondary | ICD-10-CM | POA: Diagnosis not present

## 2017-08-01 DIAGNOSIS — N183 Chronic kidney disease, stage 3 (moderate): Secondary | ICD-10-CM | POA: Diagnosis not present

## 2017-08-02 DIAGNOSIS — S93492D Sprain of other ligament of left ankle, subsequent encounter: Secondary | ICD-10-CM | POA: Diagnosis not present

## 2017-08-02 DIAGNOSIS — M1712 Unilateral primary osteoarthritis, left knee: Secondary | ICD-10-CM | POA: Diagnosis not present

## 2017-08-03 DIAGNOSIS — Z471 Aftercare following joint replacement surgery: Secondary | ICD-10-CM | POA: Diagnosis not present

## 2017-08-03 DIAGNOSIS — C642 Malignant neoplasm of left kidney, except renal pelvis: Secondary | ICD-10-CM | POA: Diagnosis not present

## 2017-08-03 DIAGNOSIS — G40209 Localization-related (focal) (partial) symptomatic epilepsy and epileptic syndromes with complex partial seizures, not intractable, without status epilepticus: Secondary | ICD-10-CM | POA: Diagnosis not present

## 2017-08-03 DIAGNOSIS — I129 Hypertensive chronic kidney disease with stage 1 through stage 4 chronic kidney disease, or unspecified chronic kidney disease: Secondary | ICD-10-CM | POA: Diagnosis not present

## 2017-08-03 DIAGNOSIS — N183 Chronic kidney disease, stage 3 (moderate): Secondary | ICD-10-CM | POA: Diagnosis not present

## 2017-08-03 DIAGNOSIS — D500 Iron deficiency anemia secondary to blood loss (chronic): Secondary | ICD-10-CM | POA: Diagnosis not present

## 2017-08-08 DIAGNOSIS — Z471 Aftercare following joint replacement surgery: Secondary | ICD-10-CM | POA: Diagnosis not present

## 2017-08-08 DIAGNOSIS — E7800 Pure hypercholesterolemia, unspecified: Secondary | ICD-10-CM | POA: Diagnosis not present

## 2017-08-08 DIAGNOSIS — I129 Hypertensive chronic kidney disease with stage 1 through stage 4 chronic kidney disease, or unspecified chronic kidney disease: Secondary | ICD-10-CM | POA: Diagnosis not present

## 2017-08-08 DIAGNOSIS — I10 Essential (primary) hypertension: Secondary | ICD-10-CM | POA: Diagnosis not present

## 2017-08-08 DIAGNOSIS — D500 Iron deficiency anemia secondary to blood loss (chronic): Secondary | ICD-10-CM | POA: Diagnosis not present

## 2017-08-08 DIAGNOSIS — N183 Chronic kidney disease, stage 3 (moderate): Secondary | ICD-10-CM | POA: Diagnosis not present

## 2017-08-08 DIAGNOSIS — G40209 Localization-related (focal) (partial) symptomatic epilepsy and epileptic syndromes with complex partial seizures, not intractable, without status epilepticus: Secondary | ICD-10-CM | POA: Diagnosis not present

## 2017-08-08 DIAGNOSIS — C642 Malignant neoplasm of left kidney, except renal pelvis: Secondary | ICD-10-CM | POA: Diagnosis not present

## 2017-08-09 DIAGNOSIS — I10 Essential (primary) hypertension: Secondary | ICD-10-CM | POA: Diagnosis not present

## 2017-08-11 ENCOUNTER — Telehealth: Payer: Self-pay | Admitting: Neurology

## 2017-08-11 DIAGNOSIS — D500 Iron deficiency anemia secondary to blood loss (chronic): Secondary | ICD-10-CM | POA: Diagnosis not present

## 2017-08-11 DIAGNOSIS — I129 Hypertensive chronic kidney disease with stage 1 through stage 4 chronic kidney disease, or unspecified chronic kidney disease: Secondary | ICD-10-CM | POA: Diagnosis not present

## 2017-08-11 DIAGNOSIS — C642 Malignant neoplasm of left kidney, except renal pelvis: Secondary | ICD-10-CM | POA: Diagnosis not present

## 2017-08-11 DIAGNOSIS — G40209 Localization-related (focal) (partial) symptomatic epilepsy and epileptic syndromes with complex partial seizures, not intractable, without status epilepticus: Secondary | ICD-10-CM | POA: Diagnosis not present

## 2017-08-11 DIAGNOSIS — Z471 Aftercare following joint replacement surgery: Secondary | ICD-10-CM | POA: Diagnosis not present

## 2017-08-11 DIAGNOSIS — N183 Chronic kidney disease, stage 3 (moderate): Secondary | ICD-10-CM | POA: Diagnosis not present

## 2017-08-11 MED ORDER — GABAPENTIN 300 MG PO CAPS: 300.0000 mg | ORAL_CAPSULE | Freq: Every day | ORAL | 3 refills | Status: DC

## 2017-08-11 NOTE — Telephone Encounter (Signed)
Pt had knee surgery 07/18/17. He has not been sleeping well since then, he said Ambien is not keeping him asleep during the night. He is concerned it could cause a seizure. He d/c pain meds 3 weeks ago and d/c blood thinner yesterday. He has had episode of dizziness. Please call asap.

## 2017-08-11 NOTE — Telephone Encounter (Signed)
I called the patient. The patient had knee surgery at the end of September with a total knee replacement on the left. Since that time he has had some discomfort from the knee but he is also had some tingly sensations into the groin area following spinal anesthesia. He will take 10 mg of Ambien at night for sleep, but he is not getting to the night.  I will add gabapentin 300 mg at night, will actually go to 600 milligrams if this is not effective.

## 2017-08-15 DIAGNOSIS — M25562 Pain in left knee: Secondary | ICD-10-CM | POA: Diagnosis not present

## 2017-08-15 DIAGNOSIS — M25662 Stiffness of left knee, not elsewhere classified: Secondary | ICD-10-CM | POA: Diagnosis not present

## 2017-08-15 DIAGNOSIS — M6281 Muscle weakness (generalized): Secondary | ICD-10-CM | POA: Diagnosis not present

## 2017-08-19 DIAGNOSIS — M25662 Stiffness of left knee, not elsewhere classified: Secondary | ICD-10-CM | POA: Diagnosis not present

## 2017-08-19 DIAGNOSIS — M25562 Pain in left knee: Secondary | ICD-10-CM | POA: Diagnosis not present

## 2017-08-19 DIAGNOSIS — M6281 Muscle weakness (generalized): Secondary | ICD-10-CM | POA: Diagnosis not present

## 2017-08-23 DIAGNOSIS — M1712 Unilateral primary osteoarthritis, left knee: Secondary | ICD-10-CM | POA: Diagnosis not present

## 2017-08-24 DIAGNOSIS — M25562 Pain in left knee: Secondary | ICD-10-CM | POA: Diagnosis not present

## 2017-08-24 DIAGNOSIS — M25662 Stiffness of left knee, not elsewhere classified: Secondary | ICD-10-CM | POA: Diagnosis not present

## 2017-08-24 DIAGNOSIS — M6281 Muscle weakness (generalized): Secondary | ICD-10-CM | POA: Diagnosis not present

## 2017-08-26 DIAGNOSIS — M25562 Pain in left knee: Secondary | ICD-10-CM | POA: Diagnosis not present

## 2017-08-26 DIAGNOSIS — M6281 Muscle weakness (generalized): Secondary | ICD-10-CM | POA: Diagnosis not present

## 2017-08-26 DIAGNOSIS — M25662 Stiffness of left knee, not elsewhere classified: Secondary | ICD-10-CM | POA: Diagnosis not present

## 2017-08-29 DIAGNOSIS — M6281 Muscle weakness (generalized): Secondary | ICD-10-CM | POA: Diagnosis not present

## 2017-08-29 DIAGNOSIS — M25662 Stiffness of left knee, not elsewhere classified: Secondary | ICD-10-CM | POA: Diagnosis not present

## 2017-08-29 DIAGNOSIS — M25562 Pain in left knee: Secondary | ICD-10-CM | POA: Diagnosis not present

## 2017-08-29 MED ORDER — GABAPENTIN 300 MG PO CAPS: 600.0000 mg | ORAL_CAPSULE | Freq: Every day | ORAL | 3 refills | Status: DC

## 2017-08-29 NOTE — Telephone Encounter (Signed)
Patient is calling to discuss Ambien. He thinks he is addicted to it and wants to stop taking it.

## 2017-08-29 NOTE — Telephone Encounter (Signed)
I called the patient.  The patient is on Ambien 10 mg at night, he feels that he is addicted to the medication and wants to get off of it.  We will have him go to 5 mg at night for 2 weeks and then stop, we will go up on the gabapentin to 600 mg at night, he will call if he needs a higher dose of gabapentin.  The gabapentin does seem to help him rest, he may also take Benadryl along with this for sleep.

## 2017-08-29 NOTE — Addendum Note (Signed)
Addended by: Kathrynn Ducking on: 08/29/2017 04:42 PM   Modules accepted: Orders

## 2017-08-30 DIAGNOSIS — Z85528 Personal history of other malignant neoplasm of kidney: Secondary | ICD-10-CM | POA: Diagnosis not present

## 2017-08-31 DIAGNOSIS — M25562 Pain in left knee: Secondary | ICD-10-CM | POA: Diagnosis not present

## 2017-08-31 DIAGNOSIS — M25662 Stiffness of left knee, not elsewhere classified: Secondary | ICD-10-CM | POA: Diagnosis not present

## 2017-08-31 DIAGNOSIS — M6281 Muscle weakness (generalized): Secondary | ICD-10-CM | POA: Diagnosis not present

## 2017-09-07 DIAGNOSIS — Z885 Allergy status to narcotic agent status: Secondary | ICD-10-CM | POA: Diagnosis not present

## 2017-09-07 DIAGNOSIS — I129 Hypertensive chronic kidney disease with stage 1 through stage 4 chronic kidney disease, or unspecified chronic kidney disease: Secondary | ICD-10-CM | POA: Diagnosis not present

## 2017-09-07 DIAGNOSIS — Z79899 Other long term (current) drug therapy: Secondary | ICD-10-CM | POA: Diagnosis not present

## 2017-09-07 DIAGNOSIS — D631 Anemia in chronic kidney disease: Secondary | ICD-10-CM | POA: Diagnosis not present

## 2017-09-07 DIAGNOSIS — Z905 Acquired absence of kidney: Secondary | ICD-10-CM | POA: Diagnosis not present

## 2017-09-07 DIAGNOSIS — G4700 Insomnia, unspecified: Secondary | ICD-10-CM | POA: Diagnosis not present

## 2017-09-07 DIAGNOSIS — G4733 Obstructive sleep apnea (adult) (pediatric): Secondary | ICD-10-CM | POA: Diagnosis not present

## 2017-09-07 DIAGNOSIS — E7800 Pure hypercholesterolemia, unspecified: Secondary | ICD-10-CM | POA: Diagnosis not present

## 2017-09-07 DIAGNOSIS — N183 Chronic kidney disease, stage 3 (moderate): Secondary | ICD-10-CM | POA: Diagnosis not present

## 2017-09-07 DIAGNOSIS — Z85528 Personal history of other malignant neoplasm of kidney: Secondary | ICD-10-CM | POA: Diagnosis not present

## 2017-09-28 DIAGNOSIS — H04123 Dry eye syndrome of bilateral lacrimal glands: Secondary | ICD-10-CM | POA: Diagnosis not present

## 2017-09-28 DIAGNOSIS — H43393 Other vitreous opacities, bilateral: Secondary | ICD-10-CM | POA: Diagnosis not present

## 2017-09-28 DIAGNOSIS — H2513 Age-related nuclear cataract, bilateral: Secondary | ICD-10-CM | POA: Diagnosis not present

## 2017-10-04 DIAGNOSIS — M1712 Unilateral primary osteoarthritis, left knee: Secondary | ICD-10-CM | POA: Diagnosis not present

## 2017-10-17 DIAGNOSIS — Q103 Other congenital malformations of eyelid: Secondary | ICD-10-CM | POA: Diagnosis not present

## 2017-10-17 DIAGNOSIS — H01021 Squamous blepharitis right upper eyelid: Secondary | ICD-10-CM | POA: Diagnosis not present

## 2017-10-17 DIAGNOSIS — H02054 Trichiasis without entropian left upper eyelid: Secondary | ICD-10-CM | POA: Diagnosis not present

## 2017-10-17 DIAGNOSIS — H01024 Squamous blepharitis left upper eyelid: Secondary | ICD-10-CM | POA: Diagnosis not present

## 2017-10-17 DIAGNOSIS — H01022 Squamous blepharitis right lower eyelid: Secondary | ICD-10-CM | POA: Diagnosis not present

## 2017-10-17 DIAGNOSIS — H01025 Squamous blepharitis left lower eyelid: Secondary | ICD-10-CM | POA: Diagnosis not present

## 2017-11-30 ENCOUNTER — Ambulatory Visit (INDEPENDENT_AMBULATORY_CARE_PROVIDER_SITE_OTHER): Payer: Medicare Other | Admitting: Adult Health

## 2017-11-30 ENCOUNTER — Encounter: Payer: Self-pay | Admitting: Adult Health

## 2017-11-30 VITALS — BP 124/68 | HR 67 | Ht 70.0 in | Wt 189.8 lb

## 2017-11-30 DIAGNOSIS — R569 Unspecified convulsions: Secondary | ICD-10-CM | POA: Diagnosis not present

## 2017-11-30 DIAGNOSIS — G4733 Obstructive sleep apnea (adult) (pediatric): Secondary | ICD-10-CM

## 2017-11-30 DIAGNOSIS — Z9989 Dependence on other enabling machines and devices: Secondary | ICD-10-CM | POA: Diagnosis not present

## 2017-11-30 NOTE — Patient Instructions (Signed)
Your Plan:  Continue Lamictal  Continue using CPAP nightly Have PCP check Lamictal level  If your symptoms worsen or you develop new symptoms please let us know.   Thank you for coming to see [place] at [facility]. I hope we have been able to provide you high quality care today.  You may receive a patient satisfaction survey over the next few weeks. We would appreciate your feedback and comments so that we may continue to improve ourselves and the health of our patients.

## 2017-11-30 NOTE — Progress Notes (Signed)
PATIENT: Kenneth Ortega DOB: 1951-06-15  REASON FOR VISIT: follow up HISTORY FROM: patient  HISTORY OF PRESENT ILLNESS: Today 11/30/17 Mr. is a 67 year old male with a history of obstructive sleep apnea on CPAP and seizures.  He returns today for follow-up.  His CPAP download indicates that he use his machine 30 out of 30 days for compliance of 100%.  He uses machine greater than 4 hours 29 out of 30 days for compliance of 97%.  On average he uses his machine 7 hours and 39 minutes.  His residual AHI is 4.8 on a minimum pressure of 6 cm of water and maximum pressure of 12 cm of water with EPR of 3.  His leak in the 95th percentile is 20.2 L/min.  He states that he has to wear his mask slightly loose in order to tolerate pressure.  He states that he continues to have daily seizures.  He describes these events as altered speech.  He reports that most people would not realize that event occurs.  This does not alter his ability to function.  He continues on Lamictal.  He denies any changes with his gait or balance.  He is able to complete all ADLs independently.  He returns today for an evaluation.    REVIEW OF SYSTEMS: Out of a complete 14 system review of symptoms, the patient complains only of the following symptoms, and all other reviewed systems are negative.  Memory loss, numbness, seizure, decreased concentration, depression, joint pain, apnea, snoring, itching, constipation  ALLERGIES: Allergies  Allergen Reactions  . Tylox [Oxycodone-Acetaminophen] Other (See Comments)    Unknown  . Keppra [Levetiracetam] Other (See Comments)    Patient unsure of reaction  . Oxycodone Other (See Comments)    hallucinations     HOME MEDICATIONS: Outpatient Medications Prior to Visit  Medication Sig Dispense Refill  . BENICAR 5 MG tablet Take 5 mg by mouth daily.     Marland Kitchen FLUoxetine (PROZAC) 20 MG capsule Take 20 mg by mouth daily.    Marland Kitchen lamoTRIgine (LAMICTAL) 200 MG tablet Take 1 tablet (200 mg  total) by mouth 2 (two) times daily. 180 tablet 3  . mometasone (NASONEX) 50 MCG/ACT nasal spray Place 2 sprays into the nose daily as needed (for allergies).     . simvastatin (ZOCOR) 20 MG tablet Take 20 mg by mouth daily.     . tamsulosin (FLOMAX) 0.4 MG CAPS capsule Take 0.4 mg by mouth daily.    . cyclobenzaprine (FLEXERIL) 10 MG tablet Take 1 tablet (10 mg total) by mouth daily as needed for muscle spasms. 5 tablet 0  . gabapentin (NEURONTIN) 300 MG capsule Take 2 capsules (600 mg total) at bedtime by mouth. 60 capsule 3  . rivaroxaban (XARELTO) 10 MG TABS tablet Take 1 tablet (10 mg total) by mouth daily with breakfast. 19 tablet 0   No facility-administered medications prior to visit.     PAST MEDICAL HISTORY: Past Medical History:  Diagnosis Date  . Cancer (HCC)    Skin, melanoma  . Chronic renal insufficiency   . Dyslipidemia   . Headache(784.0)    Migraine  . Hypertension   . Memory disturbance   . Obstructive sleep apnea on CPAP   . Prostatitis   . Renal cell carcinoma of left kidney (HCC)   . Rotator cuff tear    Left  . Rotator cuff tear    left shoulder   . Seizure ([place])    hx since 1996/7 ;  per patient , has short bursts of seizures characterized with expressive aphasia, managed by neurologist Dr Jannifer Franklin    PAST SURGICAL HISTORY: Past Surgical History:  Procedure Laterality Date  . Arthroscopic surgery     Left knee  . INCISIONAL HERNIA REPAIR     Left flank  . KIDNEY SURGERY Left 2007   Removed due to cancer; per patient " my other kidney is functioning great right now"  . Pilonidal cyst resection    . SKIN CANCER DESTRUCTION    . TOTAL KNEE ARTHROPLASTY Left 07/18/2017   Procedure: LEFT TOTAL KNEE ARTHROPLASTY;  Surgeon: Gaynelle Arabian, MD;  Location: [facility];  Service: Orthopedics;  Laterality: Left;    FAMILY HISTORY: Family History  Problem Relation Age of Onset  . Cancer Mother   . Heart Problems Mother   . Throat cancer Father   . Diabetes  Sister     SOCIAL HISTORY: Social History   Socioeconomic History  . Marital status: Married    Spouse name: Kenneth Ortega  . Number of children: 0  . Years of education: HS  . Highest education level: Not on file  Social Needs  . Financial resource strain: Not on file  . Food insecurity - worry: Not on file  . Food insecurity - inability: Not on file  . Transportation needs - medical: Not on file  . Transportation needs - non-medical: Not on file  Occupational History  . Occupation: Retired    Fish farm manager: [employer]  Tobacco Use  . Smoking status: Never Smoker  . Smokeless tobacco: Never Used  Substance and Sexual Activity  . Alcohol use: Yes    Alcohol/week: 0.6 oz    Types: 1 Cans of beer per week    Comment: OCC  . Drug use: No  . Sexual activity: Not on file  Other Topics Concern  . Not on file  Social History Narrative   Patient lives at home with his wife Kenneth Ortega). Patient is retired. High school education.   Right handed.   Caffeine- one or two cups daily      PHYSICAL EXAM  Vitals:   11/30/17 0712  BP: 124/68  Pulse: 67  Weight: 189 lb 12.8 oz (86.1 kg)  Height: 5\' 10"  (1.778 m)   Body mass index is 27.23 kg/m.  Generalized: Well developed, in no acute distress   Neurological examination  Mentation: Alert oriented to time, place, history taking. Follows all commands speech and language fluent Cranial nerve II-XII: Pupils were equal round reactive to light. Extraocular movements were full, visual field were full on confrontational test. Facial sensation and strength were normal. Uvula tongue midline. Head turning and shoulder shrug  were normal and symmetric. Motor: The motor testing reveals 5 over 5 strength of all 4 extremities. Good symmetric motor tone is noted throughout.  Sensory: Sensory testing is intact to soft touch on all 4 extremities. No evidence of extinction is noted.  Coordination: Cerebellar testing reveals good finger-nose-finger and  heel-to-shin bilaterally.  Gait and station: Gait is normal. Tandem gait is normal. Romberg is negative. No drift is seen.  Reflexes: Deep tendon reflexes are symmetric and normal bilaterally.   DIAGNOSTIC DATA (LABS, IMAGING, TESTING) - I reviewed patient records, labs, notes, testing and imaging myself where available.  Lab Results  Component Value Date   WBC 6.7 07/24/2017   HGB 8.9 (L) 07/24/2017   HCT 27.0 (L) 07/24/2017   MCV 92.8 07/24/2017   PLT 175 07/24/2017  Component Value Date/Time   NA 139 07/24/2017 0511   K 4.2 07/24/2017 0511   CL 109 07/24/2017 0511   CO2 24 07/24/2017 0511   GLUCOSE 123 (H) 07/24/2017 0511   BUN 18 07/24/2017 0511   CREATININE 1.28 (H) 07/24/2017 0511   CALCIUM 8.2 (L) 07/24/2017 0511   PROT 6.2 (L) 07/22/2017 1651   ALBUMIN 3.2 (L) 07/22/2017 1651   AST 30 07/22/2017 1651   ALT 26 07/22/2017 1651   ALKPHOS 104 07/22/2017 1651   BILITOT 0.4 07/22/2017 1651   GFRNONAA 56 (L) 07/24/2017 0511   GFRAA >60 07/24/2017 0511   Lab Results  Component Value Date   CHOL  07/20/2007    159        ATP III CLASSIFICATION:  <200     mg/dL   Desirable  200-239  mg/dL   Borderline High  >=240    mg/dL   High   HDL 15 (L) 07/20/2007   LDLCALC  07/20/2007    85        Total Cholesterol/HDL:CHD Risk Coronary Heart Disease Risk Table                     Men   Women  1/2 Average Risk   3.4   3.3   TRIG 295 (H) 07/20/2007   CHOLHDL 10.6 07/20/2007       ASSESSMENT AND PLAN 67 y.o. year old male  has a past medical history of Cancer ([place]), Chronic renal insufficiency, Dyslipidemia, Headache(784.0), Hypertension, Memory disturbance, Obstructive sleep apnea on CPAP, Prostatitis, Renal cell carcinoma of left kidney ([place]), Rotator cuff tear, Rotator cuff tear, and Seizure ([place]). here with:  1.  Obstructive sleep apnea on CPAP 2.  Seizures  The patient CPAP download shows excellent compliance and good treatment of his apnea.  I will increase  his maximum pressure to 15 cm water to see if that is more comfortable for him.  The patient will continue on Lamictal 200 mg twice a day.  He will be having blood work with his primary care on Friday.  I asked that a Lamictal level be checked at that time.  The patient voiced understanding.  He will have his lab results faxed to our office.  He is advised that if his symptoms worsen or he develops new symptoms he should let us know.  He will follow-up in 6 months or sooner if needed.    Ward Givens, MSN, NP-C 11/30/2017, 7:35 AM [facility] [address] [place], [place] [address].  He is planning on many symptoms [phone]

## 2017-11-30 NOTE — Progress Notes (Signed)
I have read the note, and I agree with the clinical assessment and plan.  Charles K Willis   

## 2017-12-01 NOTE — Progress Notes (Signed)
I agree with the assessment and plan as directed by NP .The patient is known to me .   Kiyah Demartini, MD  

## 2017-12-02 DIAGNOSIS — I10 Essential (primary) hypertension: Secondary | ICD-10-CM | POA: Diagnosis not present

## 2017-12-02 DIAGNOSIS — R739 Hyperglycemia, unspecified: Secondary | ICD-10-CM | POA: Diagnosis not present

## 2017-12-02 DIAGNOSIS — E291 Testicular hypofunction: Secondary | ICD-10-CM | POA: Diagnosis not present

## 2017-12-05 ENCOUNTER — Telehealth: Payer: Self-pay | Admitting: Adult Health

## 2017-12-05 DIAGNOSIS — H01025 Squamous blepharitis left lower eyelid: Secondary | ICD-10-CM | POA: Diagnosis not present

## 2017-12-05 DIAGNOSIS — H01022 Squamous blepharitis right lower eyelid: Secondary | ICD-10-CM | POA: Diagnosis not present

## 2017-12-05 DIAGNOSIS — R569 Unspecified convulsions: Secondary | ICD-10-CM

## 2017-12-05 DIAGNOSIS — H01024 Squamous blepharitis left upper eyelid: Secondary | ICD-10-CM | POA: Diagnosis not present

## 2017-12-05 DIAGNOSIS — H01021 Squamous blepharitis right upper eyelid: Secondary | ICD-10-CM | POA: Diagnosis not present

## 2017-12-05 DIAGNOSIS — Q103 Other congenital malformations of eyelid: Secondary | ICD-10-CM | POA: Diagnosis not present

## 2017-12-05 NOTE — Telephone Encounter (Signed)
LMVM for pt to return call for your lab work.

## 2017-12-05 NOTE — Telephone Encounter (Signed)
Pt called Jinny Blossom wanted him to have labs drawn for lamictal levels at [facility] on 2/6 but he had an appt with PCP today and was going to have it done there. Pt forgot to tell PCP, he is in town this morning and is wanting to come by the clinic to have labs drawn for this. Please call to advise asap

## 2017-12-05 NOTE — Telephone Encounter (Signed)
Spoke to pt .  He had already taken his morning medication.  I relayed that to bring his sz med with him and may take after he has his blood drawn.  He verbalized understanding.

## 2017-12-09 ENCOUNTER — Other Ambulatory Visit (INDEPENDENT_AMBULATORY_CARE_PROVIDER_SITE_OTHER): Payer: Self-pay

## 2017-12-09 DIAGNOSIS — R739 Hyperglycemia, unspecified: Secondary | ICD-10-CM | POA: Diagnosis not present

## 2017-12-09 DIAGNOSIS — R569 Unspecified convulsions: Secondary | ICD-10-CM | POA: Diagnosis not present

## 2017-12-09 DIAGNOSIS — Z0289 Encounter for other administrative examinations: Secondary | ICD-10-CM

## 2017-12-09 DIAGNOSIS — I10 Essential (primary) hypertension: Secondary | ICD-10-CM | POA: Diagnosis not present

## 2017-12-09 DIAGNOSIS — E7800 Pure hypercholesterolemia, unspecified: Secondary | ICD-10-CM | POA: Diagnosis not present

## 2017-12-12 LAB — LAMOTRIGINE LEVEL: LAMOTRIGINE LVL: 11.2 ug/mL (ref 2.0–20.0)

## 2017-12-13 ENCOUNTER — Telehealth: Payer: Self-pay | Admitting: *Deleted

## 2017-12-13 NOTE — Telephone Encounter (Signed)
I spoke to pt and relayed the results of his lamictal level (normal range) per MM/NP. He verbalized understanding.

## 2017-12-13 NOTE — Telephone Encounter (Signed)
-----   Message from Ward Givens, NP sent at 12/13/2017  8:51 AM EST ----- Lamictal level in normal range.  Please call patient with results

## 2018-01-11 DIAGNOSIS — K648 Other hemorrhoids: Secondary | ICD-10-CM | POA: Diagnosis not present

## 2018-03-10 DIAGNOSIS — Z125 Encounter for screening for malignant neoplasm of prostate: Secondary | ICD-10-CM | POA: Diagnosis not present

## 2018-03-10 DIAGNOSIS — E7800 Pure hypercholesterolemia, unspecified: Secondary | ICD-10-CM | POA: Diagnosis not present

## 2018-03-10 DIAGNOSIS — Z79899 Other long term (current) drug therapy: Secondary | ICD-10-CM | POA: Diagnosis not present

## 2018-03-10 DIAGNOSIS — N183 Chronic kidney disease, stage 3 (moderate): Secondary | ICD-10-CM | POA: Diagnosis not present

## 2018-03-10 DIAGNOSIS — E119 Type 2 diabetes mellitus without complications: Secondary | ICD-10-CM | POA: Diagnosis not present

## 2018-03-10 DIAGNOSIS — E291 Testicular hypofunction: Secondary | ICD-10-CM | POA: Diagnosis not present

## 2018-03-10 DIAGNOSIS — I10 Essential (primary) hypertension: Secondary | ICD-10-CM | POA: Diagnosis not present

## 2018-03-10 DIAGNOSIS — R739 Hyperglycemia, unspecified: Secondary | ICD-10-CM | POA: Diagnosis not present

## 2018-03-27 DIAGNOSIS — D120 Benign neoplasm of cecum: Secondary | ICD-10-CM | POA: Diagnosis not present

## 2018-03-27 DIAGNOSIS — D122 Benign neoplasm of ascending colon: Secondary | ICD-10-CM | POA: Diagnosis not present

## 2018-03-27 DIAGNOSIS — Z1211 Encounter for screening for malignant neoplasm of colon: Secondary | ICD-10-CM | POA: Diagnosis not present

## 2018-03-27 DIAGNOSIS — D124 Benign neoplasm of descending colon: Secondary | ICD-10-CM | POA: Diagnosis not present

## 2018-03-27 DIAGNOSIS — K635 Polyp of colon: Secondary | ICD-10-CM | POA: Diagnosis not present

## 2018-03-27 DIAGNOSIS — K5730 Diverticulosis of large intestine without perforation or abscess without bleeding: Secondary | ICD-10-CM | POA: Diagnosis not present

## 2018-03-27 DIAGNOSIS — K648 Other hemorrhoids: Secondary | ICD-10-CM | POA: Diagnosis not present

## 2018-03-27 DIAGNOSIS — K649 Unspecified hemorrhoids: Secondary | ICD-10-CM | POA: Diagnosis not present

## 2018-03-27 DIAGNOSIS — K5790 Diverticulosis of intestine, part unspecified, without perforation or abscess without bleeding: Secondary | ICD-10-CM | POA: Diagnosis not present

## 2018-03-27 DIAGNOSIS — Z86010 Personal history of colonic polyps: Secondary | ICD-10-CM | POA: Diagnosis not present

## 2018-03-27 DIAGNOSIS — Z09 Encounter for follow-up examination after completed treatment for conditions other than malignant neoplasm: Secondary | ICD-10-CM | POA: Diagnosis not present

## 2018-04-09 ENCOUNTER — Emergency Department (HOSPITAL_BASED_OUTPATIENT_CLINIC_OR_DEPARTMENT_OTHER)
Admission: EM | Admit: 2018-04-09 | Discharge: 2018-04-09 | Disposition: A | Discharge status: Home / Self Care | Payer: Medicare Other | Attending: Emergency Medicine | Admitting: Emergency Medicine

## 2018-04-09 ENCOUNTER — Other Ambulatory Visit: Payer: Self-pay

## 2018-04-09 ENCOUNTER — Encounter (HOSPITAL_BASED_OUTPATIENT_CLINIC_OR_DEPARTMENT_OTHER): Payer: Self-pay | Admitting: Emergency Medicine

## 2018-04-09 DIAGNOSIS — Z79899 Other long term (current) drug therapy: Secondary | ICD-10-CM | POA: Insufficient documentation

## 2018-04-09 DIAGNOSIS — Y929 Unspecified place or not applicable: Secondary | ICD-10-CM | POA: Insufficient documentation

## 2018-04-09 DIAGNOSIS — N183 Chronic kidney disease, stage 3 (moderate): Secondary | ICD-10-CM | POA: Insufficient documentation

## 2018-04-09 DIAGNOSIS — R070 Pain in throat: Secondary | ICD-10-CM | POA: Insufficient documentation

## 2018-04-09 DIAGNOSIS — Y998 Other external cause status: Secondary | ICD-10-CM | POA: Insufficient documentation

## 2018-04-09 DIAGNOSIS — Y9389 Activity, other specified: Secondary | ICD-10-CM | POA: Insufficient documentation

## 2018-04-09 DIAGNOSIS — W57XXXA Bitten or stung by nonvenomous insect and other nonvenomous arthropods, initial encounter: Secondary | ICD-10-CM | POA: Insufficient documentation

## 2018-04-09 DIAGNOSIS — I129 Hypertensive chronic kidney disease with stage 1 through stage 4 chronic kidney disease, or unspecified chronic kidney disease: Secondary | ICD-10-CM | POA: Diagnosis not present

## 2018-04-09 DIAGNOSIS — R5383 Other fatigue: Secondary | ICD-10-CM | POA: Diagnosis not present

## 2018-04-09 DIAGNOSIS — Z96652 Presence of left artificial knee joint: Secondary | ICD-10-CM | POA: Insufficient documentation

## 2018-04-09 DIAGNOSIS — J029 Acute pharyngitis, unspecified: Secondary | ICD-10-CM | POA: Diagnosis not present

## 2018-04-09 DIAGNOSIS — Z85828 Personal history of other malignant neoplasm of skin: Secondary | ICD-10-CM | POA: Diagnosis not present

## 2018-04-09 DIAGNOSIS — S30861A Insect bite (nonvenomous) of abdominal wall, initial encounter: Secondary | ICD-10-CM | POA: Diagnosis not present

## 2018-04-09 DIAGNOSIS — Z85528 Personal history of other malignant neoplasm of kidney: Secondary | ICD-10-CM | POA: Diagnosis not present

## 2018-04-09 MED ORDER — DOXYCYCLINE HYCLATE 100 MG PO TABS
100.0000 mg | ORAL_TABLET | Freq: Once | ORAL | Status: AC
  Administered 2018-04-09: 100 mg via ORAL
  Filled 2018-04-09: qty 1

## 2018-04-09 MED ORDER — DOXYCYCLINE HYCLATE 100 MG PO CAPS: 100.0000 mg | ORAL_CAPSULE | Freq: Two times a day (BID) | ORAL | 0 refills | Status: DC

## 2018-04-09 NOTE — ED Provider Notes (Signed)
[facility] EMERGENCY DEPARTMENT Provider Note   CSN: [id number] Arrival date & time: 04/09/18  1745     History   Chief Complaint Chief Complaint  Patient presents with  . Insect Bite    HPI Kenneth Ortega is a 67 y.o. male.  HPI  Patient is a 67 year old male with a history of epilepsy, dyslipidemia, hypertension, OSA who presents to the emergency department for evaluation of tick bite.  Patient reports that he pulled a tick out of his left groin as well as another tick in the suprapubic area about 5 to 6 days ago.  He noticed that over the past day there is been some redness around where the tick had bit him previously.  He denies overlying pain.  States that he has felt tired and is starting to develop a scratchy sore throat.  His wife was admitted to the ICU last week for [name] spotted fever and he is concerned about potential tickborne illness.  He denies fever, chills, headache, myalgias, rash, abdominal pain, nausea/vomiting.  Past Medical History:  Diagnosis Date  . Cancer (HCC)    Skin, melanoma  . Chronic renal insufficiency   . Dyslipidemia   . Headache(784.0)    Migraine  . Hypertension   . Memory disturbance   . Obstructive sleep apnea on CPAP   . Prostatitis   . Renal cell carcinoma of left kidney (HCC)   . Rotator cuff tear    Left  . Rotator cuff tear    left shoulder   . Seizure ([place])    hx since 1996/7 ; per patient , has short bursts of seizures characterized with expressive aphasia, managed by neurologist Dr Jannifer Franklin    Patient Active Problem List   Diagnosis Date Noted  . Dehydration   . Acute metabolic encephalopathy 75/17/0017  . History of left knee replacement 07/22/2017  . OA (osteoarthritis) of knee 07/18/2017  . Paresthesia 11/29/2016  . Periodic limb movement disorder (PLMD) 04/15/2016  . Insomnia with sleep apnea 01/26/2016  . OSA on CPAP 01/26/2016  . Orthostatic hypotension 03/06/2015  . Dizziness and giddiness  03/06/2015  . Seizure ([place])   . Localization-related (focal) (partial) epilepsy and epileptic syndromes with complex partial seizures, without mention of intractable epilepsy 06/19/2013  . Depression 06/19/2013  . Acute renal failure superimposed on stage 3 chronic kidney disease ([place]) 06/19/2013  . Essential hypertension, malignant 06/19/2013  . Dyslipidemia 06/19/2013  . Chronic prostatitis 06/19/2013  . Anemia 06/19/2013  . Obstructive sleep apnea 06/19/2013  . Migraine 06/19/2013  . Left rotator cuff tear 06/19/2013    Past Surgical History:  Procedure Laterality Date  . Arthroscopic surgery     Left knee  . INCISIONAL HERNIA REPAIR     Left flank  . KIDNEY SURGERY Left 2007   Removed due to cancer; per patient " my other kidney is functioning great right now"  . Pilonidal cyst resection    . SKIN CANCER DESTRUCTION    . TOTAL KNEE ARTHROPLASTY Left 07/18/2017   Procedure: LEFT TOTAL KNEE ARTHROPLASTY;  Surgeon: Gaynelle Arabian, MD;  Location: [facility];  Service: Orthopedics;  Laterality: Left;        Home Medications    Prior to Admission medications   Medication Sig Start Date End Date Taking? Authorizing Provider  BENICAR 5 MG tablet Take 5 mg by mouth daily.  08/04/15   [provider]  FLUoxetine (PROZAC) 20 MG capsule Take 20 mg by mouth daily. 06/09/13  [provider]  lamoTRIgine (LAMICTAL) 200 MG tablet Take 1 tablet (200 mg total) by mouth 2 (two) times daily. 05/17/17   Kathrynn Ducking, MD  mometasone (NASONEX) 50 MCG/ACT nasal spray Place 2 sprays into the nose daily as needed (for allergies).     [provider]  simvastatin (ZOCOR) 20 MG tablet Take 20 mg by mouth daily.  06/09/15   [provider]  tamsulosin (FLOMAX) 0.4 MG CAPS capsule Take 0.4 mg by mouth daily.    [provider]    Family History Family History  Problem Relation Age of Onset  . Cancer Mother   . Heart Problems Mother   . Throat cancer  Father   . Diabetes Sister     Social History Social History   Tobacco Use  . Smoking status: Never Smoker  . Smokeless tobacco: Never Used  Substance Use Topics  . Alcohol use: Yes    Alcohol/week: 0.6 oz    Types: 1 Cans of beer per week    Comment: OCC  . Drug use: No     Allergies   Tylox [oxycodone-acetaminophen]; Keppra [levetiracetam]; and Oxycodone   Review of Systems Review of Systems  Constitutional: Positive for fatigue. Negative for chills and fever.  HENT: Positive for sore throat. Negative for trouble swallowing.   Gastrointestinal: Negative for abdominal pain, nausea and vomiting.  Musculoskeletal: Negative for arthralgias and myalgias.  Skin: Positive for color change (erythematous over tick bite).  Neurological: Negative for headaches.     Physical Exam Updated Vital Signs BP 123/72 (BP Location: Left Arm)   Pulse 72   Temp 98.1 F (36.7 C) (Oral)   Resp 18   Ht 5' 10.5" (1.791 m)   Wt 85.7 kg (189 lb)   SpO2 100%   BMI 26.74 kg/m   Physical Exam  Constitutional: He is oriented to person, place, and time. He appears well-developed and well-nourished. No distress.  Sitting at bedside in no apparent distress, nontoxic-appearing.  HENT:  Head: Normocephalic and atraumatic.  Because memories moist.  Posterior oropharynx without erythema, tonsillar edema or exudate.  Uvula midline.  Airway patent.  Eyes: Right eye exhibits no discharge. Left eye exhibits no discharge.  Pulmonary/Chest: Effort normal. No respiratory distress.  Musculoskeletal:  Approximately 1 cm area of erythema over the left upper thigh with small puncture mark in the center. Another similar area of erythema in the suprapubic region. Nonfluctuant.  No overlying tenderness or induration to either area.  DP pulses 2+ bilaterally.   Neurological: He is alert and oriented to person, place, and time. Coordination normal.  Skin: Skin is warm and dry. He is not diaphoretic.    Psychiatric: He has a normal mood and affect. His behavior is normal.  Nursing note and vitals reviewed.    ED Treatments / Results  Labs (all labs ordered are listed, but only abnormal results are displayed) Labs Reviewed - No data to display  EKG None  Radiology No results found.  Procedures Procedures (including critical care time)  Medications Ordered in ED Medications  doxycycline (VIBRA-TABS) tablet 100 mg (has no administration in time range)     Initial Impression / Assessment and Plan / ED Course  I have reviewed the triage vital signs and the nursing notes.  Pertinent labs & imaging results that were available during my care of the patient were reviewed by me and considered in my medical decision making (see chart for details).     Patient presents  5 to 6 days after tick bite in his left upper thigh as well as suprapubic area.  Was concerned because there is some mild surrounding erythema to both areas which developed yesterday.  He is afebrile and vital signs stable.  He denies rash, headache, body aches.  Will go ahead and treat with doxycycline.  Counseled him on reasons to return to the emergency department he agrees and voiced understanding to the above plan and appears reliable for follow-up.  Final Clinical Impressions(s) / ED Diagnoses   Final diagnoses:  Tick bite, initial encounter    ED Discharge Orders        Ordered    doxycycline (VIBRAMYCIN) 100 MG capsule  2 times daily     04/09/18 2026       Glyn Ade, PA-C 04/09/18 2027    Sherwood Gambler, MD 04/10/18 [phone]

## 2018-04-09 NOTE — Discharge Instructions (Addendum)
Please take anti-biotic twice a day for the next 10 days.  You received your first dose in the ER today.  Return to the emergency department if you have any new or concerning symptoms like fever, new rash, body aches, headache.

## 2018-04-09 NOTE — ED Triage Notes (Signed)
Patient states that he has multiple tick bites to his left groin. He is concerned about 1 on them it looks infected. The patient states that he pulled the ticks off 5 -6 days ago

## 2018-04-19 ENCOUNTER — Ambulatory Visit: Payer: Medicare Other | Admitting: Neurology

## 2018-04-20 ENCOUNTER — Ambulatory Visit: Payer: Medicare Other | Admitting: Neurology

## 2018-05-22 DIAGNOSIS — R109 Unspecified abdominal pain: Secondary | ICD-10-CM | POA: Diagnosis not present

## 2018-05-30 ENCOUNTER — Other Ambulatory Visit: Payer: Self-pay | Admitting: Neurology

## 2018-07-06 DIAGNOSIS — E119 Type 2 diabetes mellitus without complications: Secondary | ICD-10-CM | POA: Diagnosis not present

## 2018-07-06 DIAGNOSIS — Z125 Encounter for screening for malignant neoplasm of prostate: Secondary | ICD-10-CM | POA: Diagnosis not present

## 2018-07-06 DIAGNOSIS — I10 Essential (primary) hypertension: Secondary | ICD-10-CM | POA: Diagnosis not present

## 2018-07-06 DIAGNOSIS — E291 Testicular hypofunction: Secondary | ICD-10-CM | POA: Diagnosis not present

## 2018-07-13 DIAGNOSIS — G40909 Epilepsy, unspecified, not intractable, without status epilepticus: Secondary | ICD-10-CM | POA: Diagnosis not present

## 2018-07-13 DIAGNOSIS — E7800 Pure hypercholesterolemia, unspecified: Secondary | ICD-10-CM | POA: Diagnosis not present

## 2018-07-13 DIAGNOSIS — R109 Unspecified abdominal pain: Secondary | ICD-10-CM | POA: Diagnosis not present

## 2018-07-13 DIAGNOSIS — Z23 Encounter for immunization: Secondary | ICD-10-CM | POA: Diagnosis not present

## 2018-07-13 DIAGNOSIS — J309 Allergic rhinitis, unspecified: Secondary | ICD-10-CM | POA: Diagnosis not present

## 2018-07-13 DIAGNOSIS — I10 Essential (primary) hypertension: Secondary | ICD-10-CM | POA: Diagnosis not present

## 2018-07-13 DIAGNOSIS — R7309 Other abnormal glucose: Secondary | ICD-10-CM | POA: Diagnosis not present

## 2018-09-08 DIAGNOSIS — Z905 Acquired absence of kidney: Secondary | ICD-10-CM | POA: Diagnosis not present

## 2018-09-08 DIAGNOSIS — N183 Chronic kidney disease, stage 3 (moderate): Secondary | ICD-10-CM | POA: Diagnosis not present

## 2018-09-08 DIAGNOSIS — R399 Unspecified symptoms and signs involving the genitourinary system: Secondary | ICD-10-CM | POA: Diagnosis not present

## 2018-09-08 DIAGNOSIS — I129 Hypertensive chronic kidney disease with stage 1 through stage 4 chronic kidney disease, or unspecified chronic kidney disease: Secondary | ICD-10-CM | POA: Diagnosis not present

## 2018-09-08 DIAGNOSIS — G4733 Obstructive sleep apnea (adult) (pediatric): Secondary | ICD-10-CM | POA: Diagnosis not present

## 2018-09-08 DIAGNOSIS — Z85528 Personal history of other malignant neoplasm of kidney: Secondary | ICD-10-CM | POA: Diagnosis not present

## 2018-09-08 DIAGNOSIS — N401 Enlarged prostate with lower urinary tract symptoms: Secondary | ICD-10-CM | POA: Diagnosis not present

## 2018-09-08 DIAGNOSIS — G4700 Insomnia, unspecified: Secondary | ICD-10-CM | POA: Diagnosis not present

## 2018-09-08 DIAGNOSIS — D631 Anemia in chronic kidney disease: Secondary | ICD-10-CM | POA: Diagnosis not present

## 2018-09-08 DIAGNOSIS — Z9989 Dependence on other enabling machines and devices: Secondary | ICD-10-CM | POA: Diagnosis not present

## 2018-09-11 DIAGNOSIS — R972 Elevated prostate specific antigen [PSA]: Secondary | ICD-10-CM | POA: Diagnosis not present

## 2018-09-18 DIAGNOSIS — Z85528 Personal history of other malignant neoplasm of kidney: Secondary | ICD-10-CM | POA: Diagnosis not present

## 2018-10-02 DIAGNOSIS — H43393 Other vitreous opacities, bilateral: Secondary | ICD-10-CM | POA: Diagnosis not present

## 2018-10-02 DIAGNOSIS — H01024 Squamous blepharitis left upper eyelid: Secondary | ICD-10-CM | POA: Diagnosis not present

## 2018-10-02 DIAGNOSIS — H40033 Anatomical narrow angle, bilateral: Secondary | ICD-10-CM | POA: Diagnosis not present

## 2018-10-02 DIAGNOSIS — H01025 Squamous blepharitis left lower eyelid: Secondary | ICD-10-CM | POA: Diagnosis not present

## 2018-10-02 DIAGNOSIS — H01021 Squamous blepharitis right upper eyelid: Secondary | ICD-10-CM | POA: Diagnosis not present

## 2018-10-02 DIAGNOSIS — H04123 Dry eye syndrome of bilateral lacrimal glands: Secondary | ICD-10-CM | POA: Diagnosis not present

## 2018-10-02 DIAGNOSIS — H01022 Squamous blepharitis right lower eyelid: Secondary | ICD-10-CM | POA: Diagnosis not present

## 2018-10-02 DIAGNOSIS — H2513 Age-related nuclear cataract, bilateral: Secondary | ICD-10-CM | POA: Diagnosis not present

## 2018-10-02 DIAGNOSIS — Q103 Other congenital malformations of eyelid: Secondary | ICD-10-CM | POA: Diagnosis not present

## 2018-10-27 DIAGNOSIS — G629 Polyneuropathy, unspecified: Secondary | ICD-10-CM | POA: Diagnosis not present

## 2018-10-27 DIAGNOSIS — M545 Low back pain: Secondary | ICD-10-CM | POA: Diagnosis not present

## 2018-10-27 DIAGNOSIS — I10 Essential (primary) hypertension: Secondary | ICD-10-CM | POA: Diagnosis not present

## 2018-11-07 DIAGNOSIS — M5136 Other intervertebral disc degeneration, lumbar region: Secondary | ICD-10-CM | POA: Diagnosis not present

## 2018-11-07 DIAGNOSIS — M419 Scoliosis, unspecified: Secondary | ICD-10-CM | POA: Diagnosis not present

## 2018-11-07 DIAGNOSIS — Z905 Acquired absence of kidney: Secondary | ICD-10-CM | POA: Diagnosis not present

## 2018-11-07 DIAGNOSIS — M5416 Radiculopathy, lumbar region: Secondary | ICD-10-CM | POA: Diagnosis not present

## 2018-11-07 DIAGNOSIS — M545 Low back pain: Secondary | ICD-10-CM | POA: Diagnosis not present

## 2018-11-14 DIAGNOSIS — M545 Low back pain: Secondary | ICD-10-CM | POA: Diagnosis not present

## 2018-11-20 DIAGNOSIS — Z905 Acquired absence of kidney: Secondary | ICD-10-CM | POA: Diagnosis not present

## 2018-11-20 DIAGNOSIS — M545 Low back pain: Secondary | ICD-10-CM | POA: Diagnosis not present

## 2018-11-20 DIAGNOSIS — M5136 Other intervertebral disc degeneration, lumbar region: Secondary | ICD-10-CM | POA: Diagnosis not present

## 2018-11-20 DIAGNOSIS — M419 Scoliosis, unspecified: Secondary | ICD-10-CM | POA: Diagnosis not present

## 2018-11-30 ENCOUNTER — Other Ambulatory Visit: Payer: Self-pay

## 2018-11-30 ENCOUNTER — Encounter: Payer: Self-pay | Admitting: Adult Health

## 2018-11-30 ENCOUNTER — Ambulatory Visit (INDEPENDENT_AMBULATORY_CARE_PROVIDER_SITE_OTHER): Payer: Medicare Other | Admitting: Adult Health

## 2018-11-30 VITALS — BP 129/74 | HR 69 | Resp 16 | Ht 70.5 in | Wt 193.0 lb

## 2018-11-30 DIAGNOSIS — R569 Unspecified convulsions: Secondary | ICD-10-CM

## 2018-11-30 NOTE — Progress Notes (Signed)
PATIENT: Kenneth Ortega DOB: 12/26/1950  REASON FOR VISIT: follow up HISTORY FROM: patient  HISTORY OF PRESENT ILLNESS: Today 11/30/18  Kenneth Ortega is a 68 year old male who presents for follow-up today.  He has history of seizures and obstructive sleep apnea with CPAP use.  His CPAP download download indicates excellent compliance.  He used his machine 30 out of 30 days.  His report shows 100% compliance using his machine for greater than 4 hours every night.  He had an average usage of 8 hours and 26 minutes.  His pressures shows 6/12. He had a leak of 11.8 and AHI 4.4.  He denies any problems with his equipment.  He reports that he sleeps well at night.  He is taking 200 mg of Lamictal twice a day.  He reports compliance with his medication.  He reports that he continues to have seizures every 3 to 4 days.  During his episodes he has brief moments of not being able to speak or he may stumble. These last for only a few seconds.  He reports that nobody would even notice it.  He returns back to his baseline.  These episodes do not interfere with his function.  He reports that he has had these episodes for years.  He denies any problems with his gait. He denies missing any doses of his medication.  He presents for follow-up today.  HISTORY Mr. is a 68 year old male with a history of obstructive sleep apnea on CPAP and seizures.  He returns today for follow-up.  His CPAP download indicates that he use his machine 30 out of 30 days for compliance of 100%.  He uses machine greater than 4 hours 29 out of 30 days for compliance of 97%.  On average he uses his machine 7 hours and 39 minutes.  His residual AHI is 4.8 on a minimum pressure of 6 cm of water and maximum pressure of 12 cm of water with EPR of 3.  His leak in the 95th percentile is 20.2 L/min.  He states that he has to wear his mask slightly loose in order to tolerate pressure.  He states that he continues to have daily seizures.  He  describes these events as altered speech.  He reports that most people would not realize that event occurs.  This does not alter his ability to function.  He continues on Lamictal.  He denies any changes with his gait or balance.  He is able to complete all ADLs independently.  He returns today for an evaluation.  REVIEW OF SYSTEMS: Out of a complete 14 system review of symptoms, the patient complains only of the following symptoms, and all other reviewed systems are negative.  Fatigue, apnea, snoring, environmental allergies, joint pain, back pain, seizure, depression  ALLERGIES: Allergies  Allergen Reactions  . Tylox [Oxycodone-Acetaminophen] Other (See Comments)    Unknown  . Keppra [Levetiracetam] Other (See Comments)    Patient unsure of reaction  . Oxycodone Other (See Comments)    hallucinations     HOME MEDICATIONS: Outpatient Medications Prior to Visit  Medication Sig Dispense Refill  . BENICAR 5 MG tablet Take 5 mg by mouth daily.     Marland Kitchen FLUoxetine (PROZAC) 20 MG capsule Take 20 mg by mouth daily.    Marland Kitchen lamoTRIgine (LAMICTAL) 200 MG tablet TAKE 1 TABLET TWICE A DAY 180 tablet 1  . mometasone (NASONEX) 50 MCG/ACT nasal spray Place 2 sprays into the nose daily as needed (for allergies).     Marland Kitchen  simvastatin (ZOCOR) 20 MG tablet Take 20 mg by mouth daily.     . tamsulosin (FLOMAX) 0.4 MG CAPS capsule Take 0.4 mg by mouth daily.    Marland Kitchen doxycycline (VIBRAMYCIN) 100 MG capsule Take 1 capsule (100 mg total) by mouth 2 (two) times daily. 20 capsule 0   No facility-administered medications prior to visit.     PAST MEDICAL HISTORY: Past Medical History:  Diagnosis Date  . Cancer (HCC)    Skin, melanoma  . Chronic renal insufficiency   . Dyslipidemia   . Headache(784.0)    Migraine  . Hypertension   . Memory disturbance   . Obstructive sleep apnea on CPAP   . Prostatitis   . Renal cell carcinoma of left kidney (HCC)   . Rotator cuff tear    Left  . Rotator cuff tear    left  shoulder   . Seizure ([place])    hx since 1996/7 ; per patient , has short bursts of seizures characterized with expressive aphasia, managed by neurologist Dr Jannifer Franklin    PAST SURGICAL HISTORY: Past Surgical History:  Procedure Laterality Date  . Arthroscopic surgery     Left knee  . INCISIONAL HERNIA REPAIR     Left flank  . KIDNEY SURGERY Left 2007   Removed due to cancer; per patient " my other kidney is functioning great right now"  . Pilonidal cyst resection    . SKIN CANCER DESTRUCTION    . TOTAL KNEE ARTHROPLASTY Left 07/18/2017   Procedure: LEFT TOTAL KNEE ARTHROPLASTY;  Surgeon: Gaynelle Arabian, MD;  Location: [facility];  Service: Orthopedics;  Laterality: Left;    FAMILY HISTORY: Family History  Problem Relation Age of Onset  . Cancer Mother   . Heart Problems Mother   . Throat cancer Father   . Diabetes Sister     SOCIAL HISTORY: Social History   Socioeconomic History  . Marital status: Married    Spouse name: Olin Hauser  . Number of children: 0  . Years of education: HS  . Highest education level: Not on file  Occupational History  . 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 Financial resource strain: Not on file  . Food insecurity:    Worry: Not on file    Inability: Not on file  . Transportation needs:    Medical: Not on file    Non-medical: Not on file  Tobacco Use  . Smoking status: Never Smoker  . Smokeless tobacco: Never Used  Substance and Sexual Activity  . Alcohol use: Yes    Alcohol/week: 1.0 standard drinks    Types: 1 Cans of beer per week    Comment: OCC  . Drug use: No  . Sexual activity: Not on file  Lifestyle  . Physical activity:    Days per week: Not on file    Minutes per session: Not on file  . Stress: Not on file  Relationships  . Social connections:    Talks on phone: Not on file    Gets together: Not on file    Attends religious service: Not on file    Active member of club or organization: Not on file     Attends meetings of clubs or organizations: Not on file    Relationship status: Not on file  . Intimate partner violence:    Fear of current or ex partner: Not on file    Emotionally abused: Not on file    Physically abused:  Not on file    Forced sexual activity: Not on file  Other Topics Concern  . Not on file  Social History Narrative   Patient lives at home with his wife Olin Hauser). Patient is retired. High school education.   Right handed.   Caffeine- one or two cups daily      PHYSICAL EXAM  Vitals:   11/30/18 0725  BP: 129/74  Pulse: 69  Resp: 16  Weight: 193 lb (87.5 kg)  Height: 5' 10.5" (1.791 m)   Body mass index is 27.3 kg/m.  Generalized: Well developed, in no acute distress  Neck circumference is 15. Mallampati 3. Epworth Sleepiness Scale=13 Neurological examination  Mentation: Alert oriented to time, place, history taking. Follows all commands speech and language fluent Cranial nerve II-XII: Pupils were equal round reactive to light. Extraocular movements were full, visual field were full on confrontational test. Facial sensation and strength were normal. Uvula tongue midline. Head turning and shoulder shrug  were normal and symmetric. Motor: The motor testing reveals 5 over 5 strength of all 4 extremities. Good symmetric motor tone is noted throughout.  Sensory: Sensory testing is intact to soft touch on all 4 extremities. No evidence of extinction is noted.  Coordination: Cerebellar testing reveals good finger-nose-finger and heel-to-shin bilaterally.  Gait and station: Gait is normal. Tandem gait is normal. Romberg is negative. No drift is seen.  Reflexes: Deep tendon reflexes are symmetric and normal bilaterally.   DIAGNOSTIC DATA (LABS, IMAGING, TESTING) - I reviewed patient records, labs, notes, testing and imaging myself where available.  Lab Results  Component Value Date   WBC 6.7 07/24/2017   HGB 8.9 (L) 07/24/2017   HCT 27.0 (L) 07/24/2017    MCV 92.8 07/24/2017   PLT 175 07/24/2017      Component Value Date/Time   NA 139 07/24/2017 0511   K 4.2 07/24/2017 0511   CL 109 07/24/2017 0511   CO2 24 07/24/2017 0511   GLUCOSE 123 (H) 07/24/2017 0511   BUN 18 07/24/2017 0511   CREATININE 1.28 (H) 07/24/2017 0511   CALCIUM 8.2 (L) 07/24/2017 0511   PROT 6.2 (L) 07/22/2017 1651   ALBUMIN 3.2 (L) 07/22/2017 1651   AST 30 07/22/2017 1651   ALT 26 07/22/2017 1651   ALKPHOS 104 07/22/2017 1651   BILITOT 0.4 07/22/2017 1651   GFRNONAA 56 (L) 07/24/2017 0511   GFRAA >60 07/24/2017 0511   Lab Results  Component Value Date   CHOL  07/20/2007    159        ATP III CLASSIFICATION:  <200     mg/dL   Desirable  200-239  mg/dL   Borderline High  >=240    mg/dL   High   HDL 15 (L) 07/20/2007   LDLCALC  07/20/2007    85        Total Cholesterol/HDL:CHD Risk Coronary Heart Disease Risk Table                     Men   Women  1/2 Average Risk   3.4   3.3   TRIG 295 (H) 07/20/2007   CHOLHDL 10.6 07/20/2007   No results found for: HGBA1C No results found for: VITAMINB12   TSH 0.241   ASSESSMENT AND PLAN 68 y.o. year old male  has a past medical history of Cancer ([place]), Chronic renal insufficiency, Dyslipidemia, Headache(784.0), Hypertension, Memory disturbance, Obstructive sleep apnea on CPAP, Prostatitis, Renal cell carcinoma of left kidney ([place]), Rotator cuff  tear, Rotator cuff tear, and Seizure ([place]). here with:  1. Obstructive Sleep Apnea 2. Seizures  Overall his seizures remain unchanged from baseline.  His episodes are brief.  And they do not interfere with his function.  He will continue taking Lamictal 200 mg twice a day.  I will check a Lamictal level today.  He reports having lab work done with his primary care doctor that was normal recently.   His CPAP compliance report reveals excellent usage.  He will continue using his machine nightly for greater than 4 hours.  He will follow-up in 1 year or sooner if needed.  He was  advised to call our office if he has any new symptoms or any questions.   Butler Denmark, AGNP-C, DNP 11/30/2018, 8:41 AM [facility] [address], [place], [place] [address] [phone]

## 2018-11-30 NOTE — Progress Notes (Signed)
I have read the note, and I agree with the clinical assessment and plan.  Rollande Thursby K Arval Brandstetter   

## 2018-12-02 LAB — LAMOTRIGINE LEVEL: Lamotrigine Lvl: 10.1 ug/mL (ref 2.0–20.0)

## 2018-12-07 ENCOUNTER — Other Ambulatory Visit: Payer: Self-pay | Admitting: Adult Health

## 2018-12-11 DIAGNOSIS — M6281 Muscle weakness (generalized): Secondary | ICD-10-CM | POA: Diagnosis not present

## 2018-12-11 DIAGNOSIS — M545 Low back pain: Secondary | ICD-10-CM | POA: Diagnosis not present

## 2018-12-13 DIAGNOSIS — M545 Low back pain: Secondary | ICD-10-CM | POA: Diagnosis not present

## 2018-12-13 DIAGNOSIS — M6281 Muscle weakness (generalized): Secondary | ICD-10-CM | POA: Diagnosis not present

## 2018-12-19 DIAGNOSIS — M545 Low back pain: Secondary | ICD-10-CM | POA: Diagnosis not present

## 2018-12-19 DIAGNOSIS — M6281 Muscle weakness (generalized): Secondary | ICD-10-CM | POA: Diagnosis not present

## 2018-12-20 ENCOUNTER — Other Ambulatory Visit: Payer: Self-pay | Admitting: Adult Health

## 2018-12-20 ENCOUNTER — Telehealth: Payer: Self-pay | Admitting: Neurology

## 2018-12-20 MED ORDER — GABAPENTIN 300 MG PO CAPS: 600.0000 mg | ORAL_CAPSULE | Freq: Every day | ORAL | 1 refills | Status: DC

## 2018-12-20 NOTE — Telephone Encounter (Signed)
Pt states Dr Jannifer Franklin once wrote him a script for 300 mg of Gabapentin.  Pt is asking for another script for the  300 mg of Gabapentin and that it be sent to  [id number]

## 2018-12-20 NOTE — Telephone Encounter (Signed)
Spoke to pt.  He states that he was taking along with benadryl for sleep, since coming off ambien. (which was not working for him).  Was at the time having issues with his toes but that was not what he was using it for.  He only uses prn.  I relayed sent to Dr. Jannifer Franklin.

## 2018-12-20 NOTE — Telephone Encounter (Signed)
LMVM for pt to return call.  ? About gabapentin.  Called [facility] and spoke to [place].  Pt last filled 10/04/2018.  The 2 refills expired.   Taking 300mg  caps (2 caps po qhs).

## 2018-12-20 NOTE — Addendum Note (Signed)
Addended by: Brandon Melnick on: 12/20/2018 03:57 PM   Modules accepted: Orders

## 2018-12-21 DIAGNOSIS — M545 Low back pain: Secondary | ICD-10-CM | POA: Diagnosis not present

## 2018-12-21 DIAGNOSIS — M6281 Muscle weakness (generalized): Secondary | ICD-10-CM | POA: Diagnosis not present

## 2018-12-27 NOTE — Telephone Encounter (Signed)
error 

## 2019-01-07 IMAGING — DX DG CHEST 2V
2 series · 2 of 2 positions shown · non-contrast
Comparison: PA and lateral chest x-ray May 31, 2016

CLINICAL DATA: History of renal malignancy in 1330. Surveillance
follow-up. History of hypertension, hyperlipidemia, nonsmoker.

EXAM:
CHEST  2 VIEW

[chest pa]
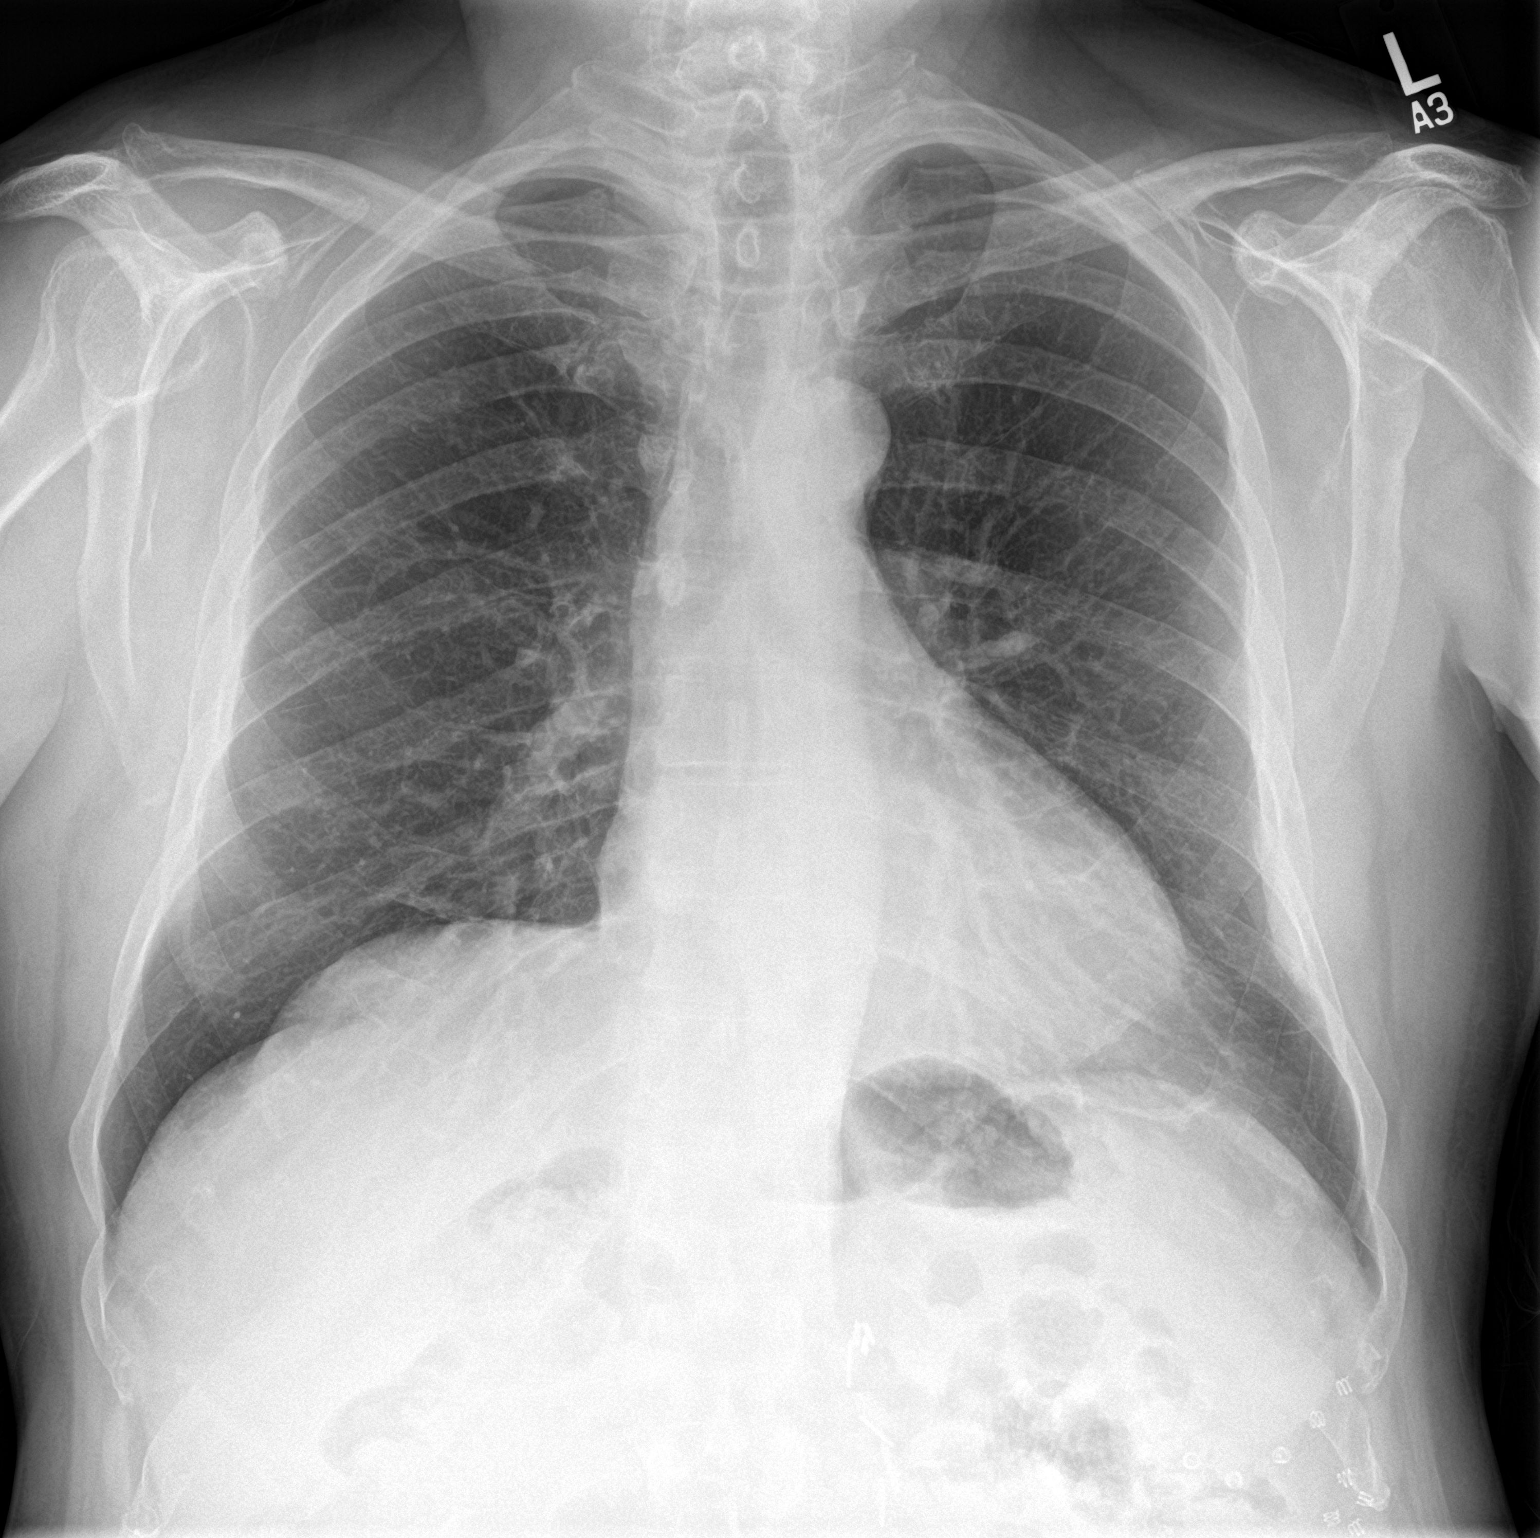

[chest lat]
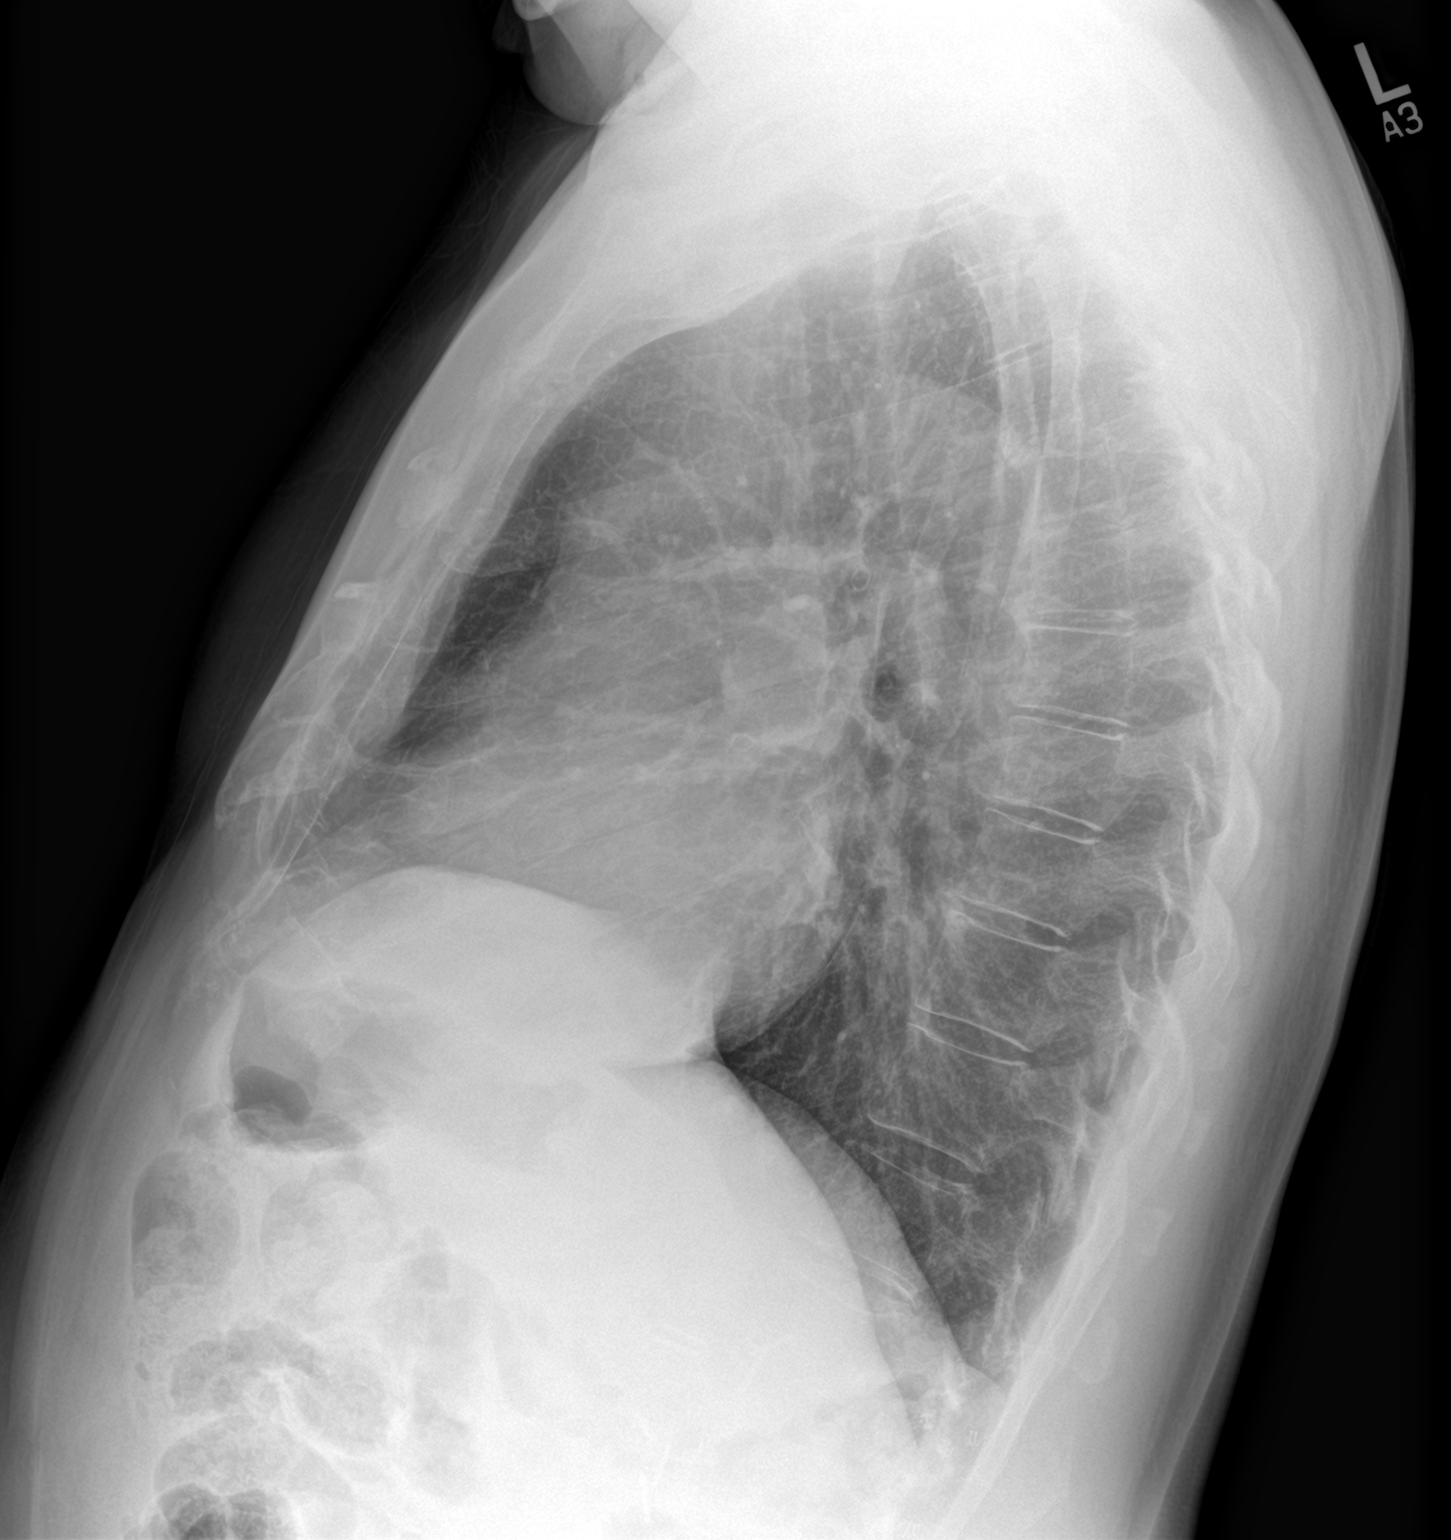

[2 of 2 positions shown; findings below may reference images not displayed]

FINDINGS: The lungs are adequately inflated and clear. No pulmonary
parenchymal nodules or masses are observed. There is no interstitial
or alveolar infiltrate. The heart and pulmonary vascularity are
normal. The mediastinum is normal in width. The bony thorax exhibits
no acute abnormality.
IMPRESSION: There is no evidence of metastatic disease to the lungs. There is no
pneumonia nor other acute cardiopulmonary abnormality.

## 2019-02-11 ENCOUNTER — Other Ambulatory Visit: Payer: Self-pay | Admitting: Neurology

## 2019-04-06 DIAGNOSIS — Z0000 Encounter for general adult medical examination without abnormal findings: Secondary | ICD-10-CM | POA: Diagnosis not present

## 2019-04-06 DIAGNOSIS — R109 Unspecified abdominal pain: Secondary | ICD-10-CM | POA: Diagnosis not present

## 2019-04-06 DIAGNOSIS — I10 Essential (primary) hypertension: Secondary | ICD-10-CM | POA: Diagnosis not present

## 2019-04-06 DIAGNOSIS — D649 Anemia, unspecified: Secondary | ICD-10-CM | POA: Diagnosis not present

## 2019-04-06 DIAGNOSIS — G40909 Epilepsy, unspecified, not intractable, without status epilepticus: Secondary | ICD-10-CM | POA: Diagnosis not present

## 2019-04-06 DIAGNOSIS — E7800 Pure hypercholesterolemia, unspecified: Secondary | ICD-10-CM | POA: Diagnosis not present

## 2019-04-11 DIAGNOSIS — E7800 Pure hypercholesterolemia, unspecified: Secondary | ICD-10-CM | POA: Diagnosis not present

## 2019-04-11 DIAGNOSIS — D649 Anemia, unspecified: Secondary | ICD-10-CM | POA: Diagnosis not present

## 2019-04-11 DIAGNOSIS — Z79899 Other long term (current) drug therapy: Secondary | ICD-10-CM | POA: Diagnosis not present

## 2019-04-11 DIAGNOSIS — R202 Paresthesia of skin: Secondary | ICD-10-CM | POA: Diagnosis not present

## 2019-04-11 DIAGNOSIS — I10 Essential (primary) hypertension: Secondary | ICD-10-CM | POA: Diagnosis not present

## 2019-04-11 DIAGNOSIS — R7309 Other abnormal glucose: Secondary | ICD-10-CM | POA: Diagnosis not present

## 2019-04-18 DIAGNOSIS — R1032 Left lower quadrant pain: Secondary | ICD-10-CM | POA: Diagnosis not present

## 2019-04-18 DIAGNOSIS — R1031 Right lower quadrant pain: Secondary | ICD-10-CM | POA: Diagnosis not present

## 2019-04-24 DIAGNOSIS — R911 Solitary pulmonary nodule: Secondary | ICD-10-CM | POA: Diagnosis not present

## 2019-04-24 DIAGNOSIS — N400 Enlarged prostate without lower urinary tract symptoms: Secondary | ICD-10-CM | POA: Diagnosis not present

## 2019-04-24 DIAGNOSIS — K4090 Unilateral inguinal hernia, without obstruction or gangrene, not specified as recurrent: Secondary | ICD-10-CM | POA: Diagnosis not present

## 2019-04-24 DIAGNOSIS — R1030 Lower abdominal pain, unspecified: Secondary | ICD-10-CM | POA: Diagnosis not present

## 2019-04-24 DIAGNOSIS — K5730 Diverticulosis of large intestine without perforation or abscess without bleeding: Secondary | ICD-10-CM | POA: Diagnosis not present

## 2019-05-28 ENCOUNTER — Other Ambulatory Visit: Payer: Self-pay

## 2019-06-20 DIAGNOSIS — K648 Other hemorrhoids: Secondary | ICD-10-CM | POA: Diagnosis not present

## 2019-06-20 DIAGNOSIS — K4090 Unilateral inguinal hernia, without obstruction or gangrene, not specified as recurrent: Secondary | ICD-10-CM | POA: Diagnosis not present

## 2019-07-06 DIAGNOSIS — Z125 Encounter for screening for malignant neoplasm of prostate: Secondary | ICD-10-CM | POA: Diagnosis not present

## 2019-07-06 DIAGNOSIS — I10 Essential (primary) hypertension: Secondary | ICD-10-CM | POA: Diagnosis not present

## 2019-07-13 DIAGNOSIS — N183 Chronic kidney disease, stage 3 (moderate): Secondary | ICD-10-CM | POA: Diagnosis not present

## 2019-07-13 DIAGNOSIS — E7800 Pure hypercholesterolemia, unspecified: Secondary | ICD-10-CM | POA: Diagnosis not present

## 2019-07-13 DIAGNOSIS — I10 Essential (primary) hypertension: Secondary | ICD-10-CM | POA: Diagnosis not present

## 2019-07-13 DIAGNOSIS — G40909 Epilepsy, unspecified, not intractable, without status epilepticus: Secondary | ICD-10-CM | POA: Diagnosis not present

## 2019-07-13 DIAGNOSIS — G629 Polyneuropathy, unspecified: Secondary | ICD-10-CM | POA: Diagnosis not present

## 2019-07-13 DIAGNOSIS — Z0000 Encounter for general adult medical examination without abnormal findings: Secondary | ICD-10-CM | POA: Diagnosis not present

## 2019-07-13 DIAGNOSIS — D649 Anemia, unspecified: Secondary | ICD-10-CM | POA: Diagnosis not present

## 2019-07-13 DIAGNOSIS — E119 Type 2 diabetes mellitus without complications: Secondary | ICD-10-CM | POA: Diagnosis not present

## 2019-07-13 DIAGNOSIS — R5383 Other fatigue: Secondary | ICD-10-CM | POA: Diagnosis not present

## 2019-08-10 DIAGNOSIS — M8589 Other specified disorders of bone density and structure, multiple sites: Secondary | ICD-10-CM | POA: Diagnosis not present

## 2019-08-10 DIAGNOSIS — Z79899 Other long term (current) drug therapy: Secondary | ICD-10-CM | POA: Diagnosis not present

## 2019-08-10 DIAGNOSIS — G629 Polyneuropathy, unspecified: Secondary | ICD-10-CM | POA: Diagnosis not present

## 2019-08-10 DIAGNOSIS — D6949 Other primary thrombocytopenia: Secondary | ICD-10-CM | POA: Diagnosis not present

## 2019-08-10 DIAGNOSIS — I10 Essential (primary) hypertension: Secondary | ICD-10-CM | POA: Diagnosis not present

## 2019-08-10 DIAGNOSIS — D649 Anemia, unspecified: Secondary | ICD-10-CM | POA: Diagnosis not present

## 2019-08-10 DIAGNOSIS — R5383 Other fatigue: Secondary | ICD-10-CM | POA: Diagnosis not present

## 2019-08-17 DIAGNOSIS — D649 Anemia, unspecified: Secondary | ICD-10-CM | POA: Diagnosis not present

## 2019-08-17 DIAGNOSIS — I10 Essential (primary) hypertension: Secondary | ICD-10-CM | POA: Diagnosis not present

## 2019-08-17 DIAGNOSIS — N183 Chronic kidney disease, stage 3 unspecified: Secondary | ICD-10-CM | POA: Diagnosis not present

## 2019-08-17 DIAGNOSIS — E7800 Pure hypercholesterolemia, unspecified: Secondary | ICD-10-CM | POA: Diagnosis not present

## 2019-08-17 DIAGNOSIS — E119 Type 2 diabetes mellitus without complications: Secondary | ICD-10-CM | POA: Diagnosis not present

## 2019-08-23 IMAGING — CR DG CHEST 2V
2 series · 2 of 2 positions shown · non-contrast
Comparison: 12/06/2016 and earlier.

CLINICAL DATA: 66-year-old male with recent knee replacement. Fever
and confusion.

EXAM:
CHEST  2 VIEW

[w chest lat]
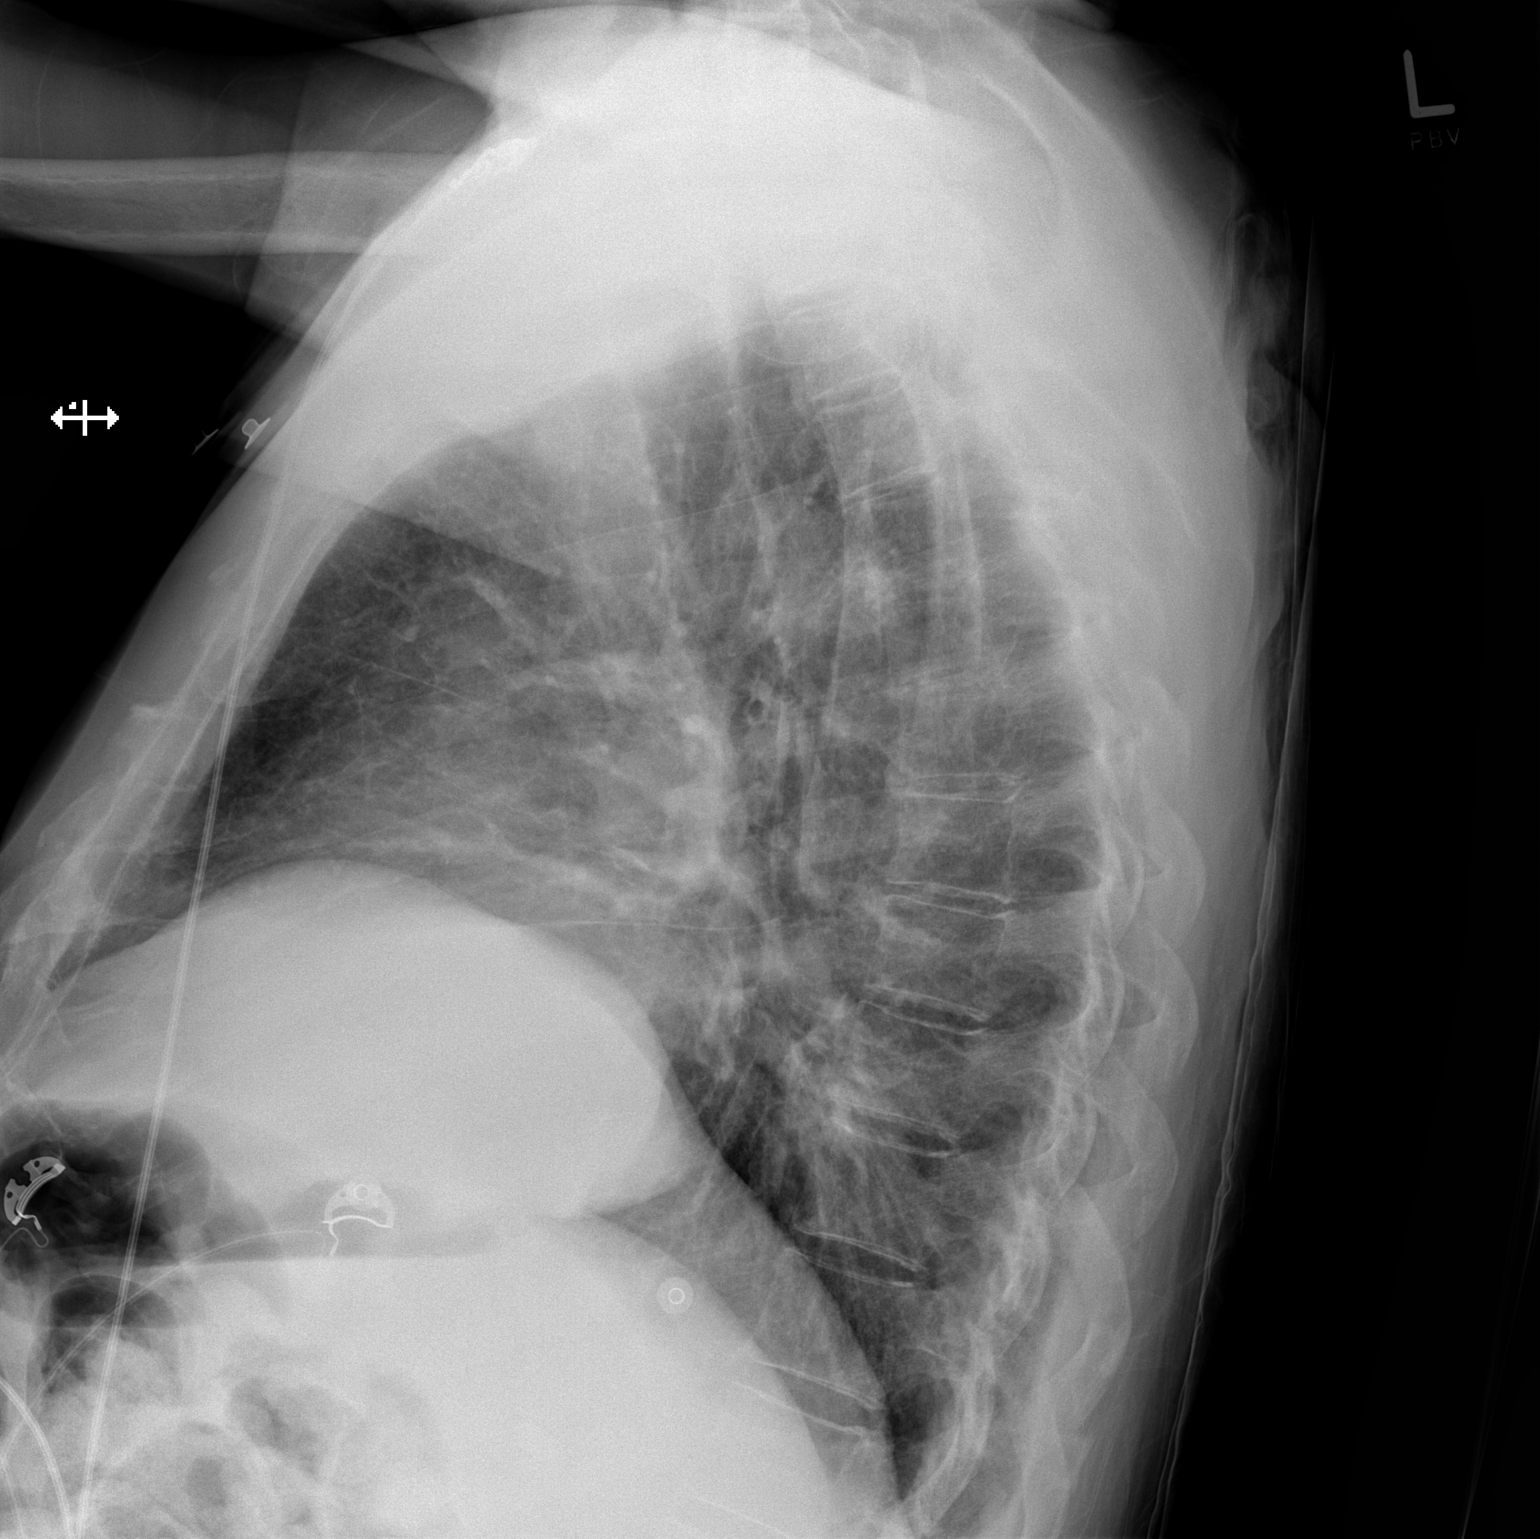

[x chest ap]
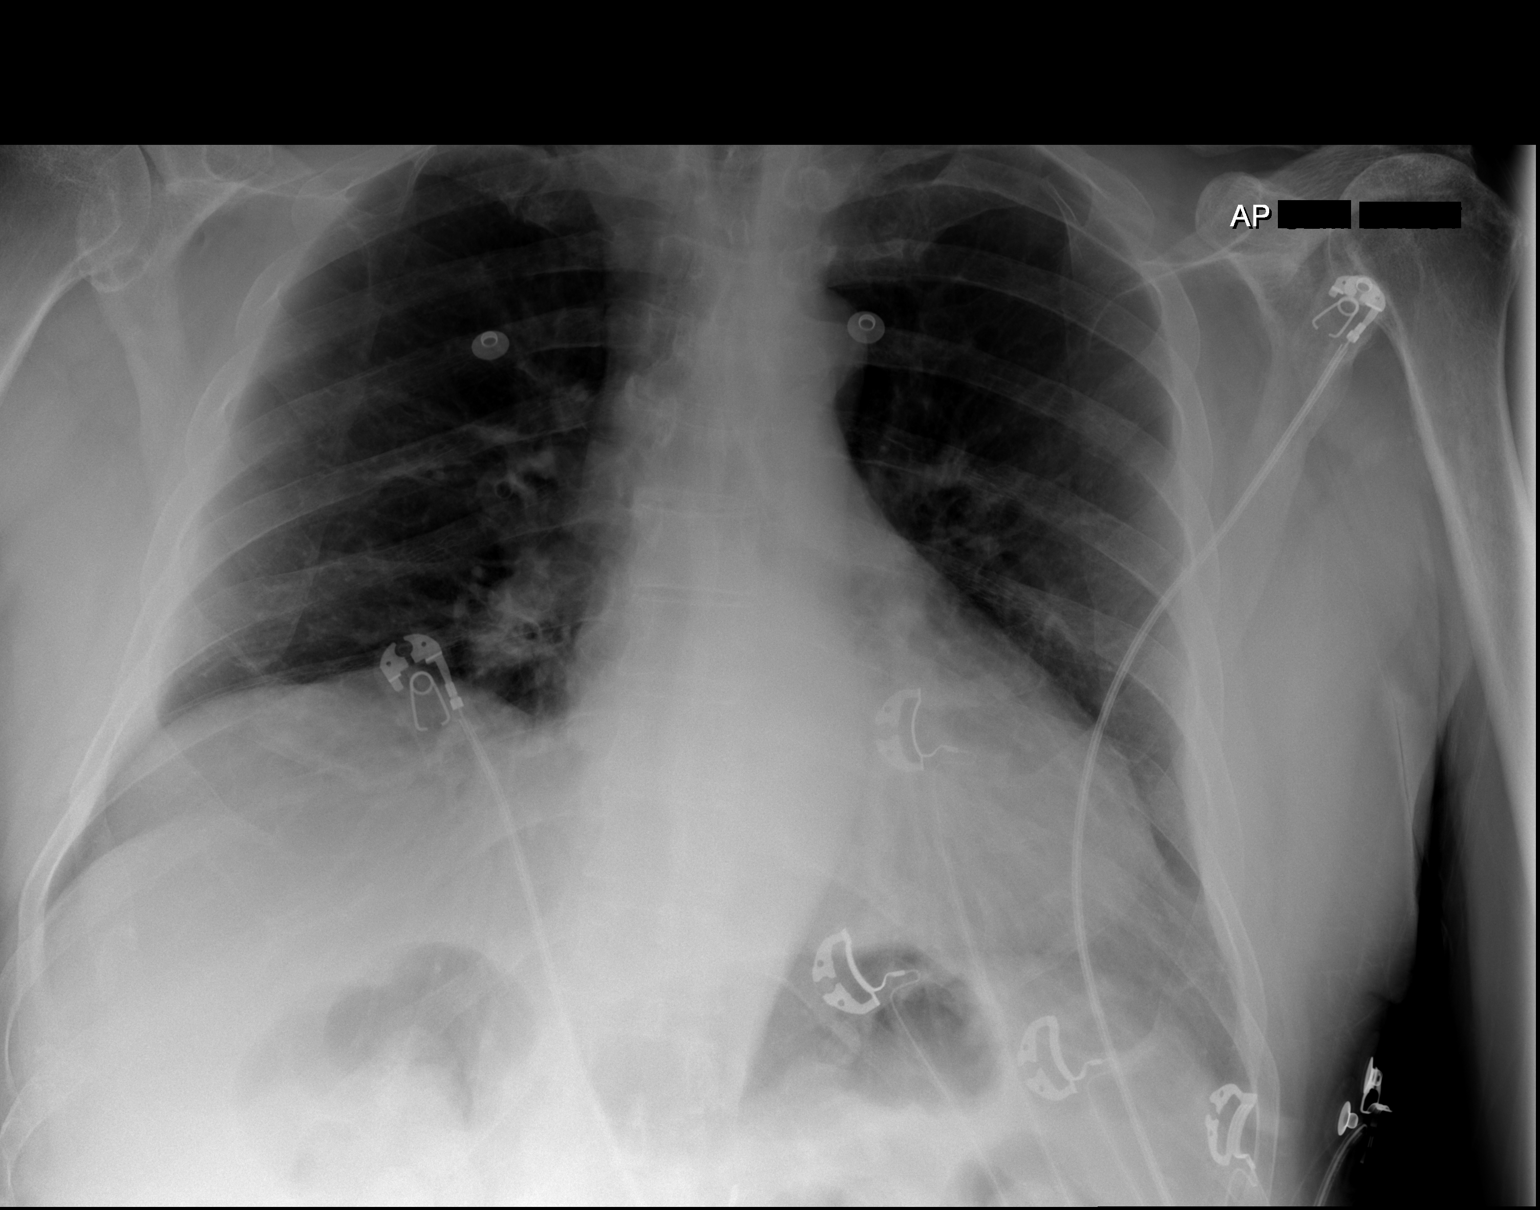

[2 of 2 positions shown; findings below may reference images not displayed]

FINDINGS: Semi upright AP and lateral views of the chest. Stable to mildly
increased cardiac silhouette. Other mediastinal contours are within
normal limits. Visualized tracheal air column is within normal
limits. No pneumothorax, pulmonary edema, pleural effusion or
confluent pulmonary opacity. No acute osseous abnormality
identified. Visible bowel gas pattern is similar an within normal
limits.
IMPRESSION: No acute cardiopulmonary abnormality.

## 2019-08-27 DIAGNOSIS — H43393 Other vitreous opacities, bilateral: Secondary | ICD-10-CM | POA: Diagnosis not present

## 2019-08-27 DIAGNOSIS — H0102A Squamous blepharitis right eye, upper and lower eyelids: Secondary | ICD-10-CM | POA: Diagnosis not present

## 2019-08-27 DIAGNOSIS — H40033 Anatomical narrow angle, bilateral: Secondary | ICD-10-CM | POA: Diagnosis not present

## 2019-08-27 DIAGNOSIS — H04123 Dry eye syndrome of bilateral lacrimal glands: Secondary | ICD-10-CM | POA: Diagnosis not present

## 2019-08-27 DIAGNOSIS — H02055 Trichiasis without entropian left lower eyelid: Secondary | ICD-10-CM | POA: Diagnosis not present

## 2019-08-27 DIAGNOSIS — H0102B Squamous blepharitis left eye, upper and lower eyelids: Secondary | ICD-10-CM | POA: Diagnosis not present

## 2019-08-27 DIAGNOSIS — H2513 Age-related nuclear cataract, bilateral: Secondary | ICD-10-CM | POA: Diagnosis not present

## 2019-09-19 DIAGNOSIS — Z87448 Personal history of other diseases of urinary system: Secondary | ICD-10-CM | POA: Diagnosis not present

## 2019-09-19 DIAGNOSIS — D631 Anemia in chronic kidney disease: Secondary | ICD-10-CM | POA: Diagnosis not present

## 2019-09-19 DIAGNOSIS — Z905 Acquired absence of kidney: Secondary | ICD-10-CM | POA: Diagnosis not present

## 2019-09-19 DIAGNOSIS — N183 Chronic kidney disease, stage 3 unspecified: Secondary | ICD-10-CM | POA: Diagnosis not present

## 2019-09-19 DIAGNOSIS — G4733 Obstructive sleep apnea (adult) (pediatric): Secondary | ICD-10-CM | POA: Diagnosis not present

## 2019-09-19 DIAGNOSIS — G4700 Insomnia, unspecified: Secondary | ICD-10-CM | POA: Diagnosis not present

## 2019-09-19 DIAGNOSIS — I129 Hypertensive chronic kidney disease with stage 1 through stage 4 chronic kidney disease, or unspecified chronic kidney disease: Secondary | ICD-10-CM | POA: Diagnosis not present

## 2019-09-19 DIAGNOSIS — Z9989 Dependence on other enabling machines and devices: Secondary | ICD-10-CM | POA: Diagnosis not present

## 2019-09-19 DIAGNOSIS — Z79899 Other long term (current) drug therapy: Secondary | ICD-10-CM | POA: Diagnosis not present

## 2019-09-19 DIAGNOSIS — Z85528 Personal history of other malignant neoplasm of kidney: Secondary | ICD-10-CM | POA: Diagnosis not present

## 2019-09-25 DIAGNOSIS — R972 Elevated prostate specific antigen [PSA]: Secondary | ICD-10-CM | POA: Diagnosis not present

## 2019-10-02 DIAGNOSIS — Z125 Encounter for screening for malignant neoplasm of prostate: Secondary | ICD-10-CM | POA: Diagnosis not present

## 2019-10-02 DIAGNOSIS — N400 Enlarged prostate without lower urinary tract symptoms: Secondary | ICD-10-CM | POA: Diagnosis not present

## 2019-10-04 DIAGNOSIS — K4090 Unilateral inguinal hernia, without obstruction or gangrene, not specified as recurrent: Secondary | ICD-10-CM | POA: Diagnosis not present

## 2019-10-05 DIAGNOSIS — D649 Anemia, unspecified: Secondary | ICD-10-CM | POA: Diagnosis not present

## 2019-10-12 DIAGNOSIS — E7800 Pure hypercholesterolemia, unspecified: Secondary | ICD-10-CM | POA: Diagnosis not present

## 2019-10-12 DIAGNOSIS — I10 Essential (primary) hypertension: Secondary | ICD-10-CM | POA: Diagnosis not present

## 2019-10-12 DIAGNOSIS — N183 Chronic kidney disease, stage 3 unspecified: Secondary | ICD-10-CM | POA: Diagnosis not present

## 2019-10-12 DIAGNOSIS — E119 Type 2 diabetes mellitus without complications: Secondary | ICD-10-CM | POA: Diagnosis not present

## 2019-10-27 DIAGNOSIS — Z03818 Encounter for observation for suspected exposure to other biological agents ruled out: Secondary | ICD-10-CM | POA: Diagnosis not present

## 2019-11-10 DIAGNOSIS — Z03818 Encounter for observation for suspected exposure to other biological agents ruled out: Secondary | ICD-10-CM | POA: Diagnosis not present

## 2019-12-03 ENCOUNTER — Encounter: Payer: Self-pay | Admitting: Neurology

## 2019-12-03 ENCOUNTER — Telehealth: Payer: Self-pay

## 2019-12-03 ENCOUNTER — Ambulatory Visit (INDEPENDENT_AMBULATORY_CARE_PROVIDER_SITE_OTHER): Payer: Medicare Other | Admitting: Neurology

## 2019-12-03 ENCOUNTER — Other Ambulatory Visit: Payer: Self-pay

## 2019-12-03 VITALS — BP 122/74 | HR 66 | Temp 97.3°F | Ht 70.5 in | Wt 195.0 lb

## 2019-12-03 DIAGNOSIS — R569 Unspecified convulsions: Secondary | ICD-10-CM | POA: Diagnosis not present

## 2019-12-03 DIAGNOSIS — H02055 Trichiasis without entropian left lower eyelid: Secondary | ICD-10-CM | POA: Diagnosis not present

## 2019-12-03 DIAGNOSIS — Z9989 Dependence on other enabling machines and devices: Secondary | ICD-10-CM | POA: Diagnosis not present

## 2019-12-03 DIAGNOSIS — H04123 Dry eye syndrome of bilateral lacrimal glands: Secondary | ICD-10-CM | POA: Diagnosis not present

## 2019-12-03 DIAGNOSIS — H40033 Anatomical narrow angle, bilateral: Secondary | ICD-10-CM | POA: Diagnosis not present

## 2019-12-03 DIAGNOSIS — G4733 Obstructive sleep apnea (adult) (pediatric): Secondary | ICD-10-CM

## 2019-12-03 DIAGNOSIS — H0102B Squamous blepharitis left eye, upper and lower eyelids: Secondary | ICD-10-CM | POA: Diagnosis not present

## 2019-12-03 DIAGNOSIS — H2513 Age-related nuclear cataract, bilateral: Secondary | ICD-10-CM | POA: Diagnosis not present

## 2019-12-03 DIAGNOSIS — H0102A Squamous blepharitis right eye, upper and lower eyelids: Secondary | ICD-10-CM | POA: Diagnosis not present

## 2019-12-03 DIAGNOSIS — H43393 Other vitreous opacities, bilateral: Secondary | ICD-10-CM | POA: Diagnosis not present

## 2019-12-03 MED ORDER — LAMOTRIGINE 200 MG PO TABS: 200.0000 mg | ORAL_TABLET | Freq: Two times a day (BID) | ORAL | 4 refills | Status: DC

## 2019-12-03 NOTE — Progress Notes (Signed)
I have read the note, and I agree with the clinical assessment and plan.  Kenneth Ortega   

## 2019-12-03 NOTE — Progress Notes (Signed)
PATIENT: Kenneth Ortega DOB: [birth year]  REASON FOR VISIT: follow up HISTORY FROM: patient  HISTORY OF PRESENT ILLNESS: Today 12/03/19  Kenneth Ortega is a 69 year old male who presents for follow-up for seizures and obstructive sleep apnea with CPAP use.  His download indicates excellent compliance use 30/30 days, 97% greater than 4 hours use.  Average total usage was 8 hours 58 minutes.  His minimum pressure is set at 6, max at 12.  His leak in the 95th percentile was 22.2, AHI was 6.1.  He reports that he feels sleepy all the time, he feels he does sleep during the night, but the next day he has fatigue. He feels his mask fits well, doesn't notice any leak. He is dealing with a left shoulder injury, has not been able to exercise as he would like.  His blood sugar was recently high, his primary doctor started him on Farxiga.  He reports otherwise, blood work was normal, in regards to fatigue.  He is on Lamictal 200 mg twice a day.  He continues to report daily episodes of seizures that are brief, described as impairing his concentration,  And altered speech.  He says nobody will even notice it.  They do not interfere with his ability to function.  He has been having these episodes for years.  He presents today for follow-up unaccompanied.  HISTORY 11/30/2018 SS: Kenneth Ortega is a 69 year old male who presents for follow-up today.  He has history of seizures and obstructive sleep apnea with CPAP use.  His CPAP download download indicates excellent compliance.  He used his machine 30 out of 30 days.  His report shows 100% compliance using his machine for greater than 4 hours every night.  He had an average usage of 8 hours and 26 minutes.  His pressures shows 6/12. He had a leak of 11.8 and AHI 4.4.  He denies any problems with his equipment. He reports that he sleeps well at night.  He is taking 200 mg of Lamictal twice a day.  He reports compliance with his medication.  He reports that he continues to  have seizures every 3 to 4 days.  During his episodes he has brief moments of not being able to speak or he may stumble. These last for only a few seconds.  He reports that nobody would even notice it.  He returns back to his baseline.  These episodes do not interfere with his function.  He reports that he has had these episodes for years.  He denies any problems with his gait. He denies missing any doses of his medication.  He presents for follow-up today.   REVIEW OF SYSTEMS: Out of a complete 14 system review of symptoms, the patient complains only of the following symptoms, and all other reviewed systems are negative.  Seizures, fatigue  ALLERGIES: Allergies  Allergen Reactions  . Tylox [Oxycodone-Acetaminophen] Other (See Comments)    Unknown  . Keppra [Levetiracetam] Other (See Comments)    Patient unsure of reaction  . Oxycodone Other (See Comments)    hallucinations     HOME MEDICATIONS: Outpatient Medications Prior to Visit  Medication Sig Dispense Refill  . BENICAR 5 MG tablet Take 5 mg by mouth daily.     . dapagliflozin propanediol (FARXIGA) 5 MG TABS tablet Take by mouth.    Marland Kitchen FLUoxetine (PROZAC) 20 MG capsule Take 20 mg by mouth daily.    Marland Kitchen gabapentin (NEURONTIN) 300 MG capsule TAKE 2 CAPSULES (600 MG  TOTAL) BY MOUTH AT BEDTIME. 60 capsule 1  . lamoTRIgine (LAMICTAL) 200 MG tablet TAKE 1 TABLET TWICE A DAY 180 tablet 4  . simvastatin (ZOCOR) 20 MG tablet Take 20 mg by mouth daily.     . tamsulosin (FLOMAX) 0.4 MG CAPS capsule Take 0.4 mg by mouth daily.    . mometasone (NASONEX) 50 MCG/ACT nasal spray Place 2 sprays into the nose daily as needed (for allergies).      No facility-administered medications prior to visit.    PAST MEDICAL HISTORY: Past Medical History:  Diagnosis Date  . Cancer (HCC)    Skin, melanoma  . Chronic renal insufficiency   . Dyslipidemia   . Headache(784.0)    Migraine  . Hypertension   . Memory disturbance   . Obstructive sleep apnea  on CPAP   . Prostatitis   . Renal cell carcinoma of left kidney (HCC)   . Rotator cuff tear    Left  . Rotator cuff tear    left shoulder   . Seizure ([place])    hx since 1996/7 ; per patient , has short bursts of seizures characterized with expressive aphasia, managed by neurologist Dr Jannifer Franklin    PAST SURGICAL HISTORY: Past Surgical History:  Procedure Laterality Date  . Arthroscopic surgery     Left knee  . INCISIONAL HERNIA REPAIR     Left flank  . KIDNEY SURGERY Left 2007   Removed due to cancer; per patient " my other kidney is functioning great right now"  . Pilonidal cyst resection    . SKIN CANCER DESTRUCTION    . TOTAL KNEE ARTHROPLASTY Left 07/18/2017   Procedure: LEFT TOTAL KNEE ARTHROPLASTY;  Surgeon: Gaynelle Arabian, MD;  Location: [facility];  Service: Orthopedics;  Laterality: Left;    FAMILY HISTORY: Family History  Problem Relation Age of Onset  . Cancer Mother   . Heart Problems Mother   . Throat cancer Father   . Diabetes Sister     SOCIAL HISTORY: Social History   Socioeconomic History  . Marital status: Married    Spouse name: Olin Hauser  . Number of children: 0  . Years of education: HS  . Highest education level: Not on file  Occupational History  . Occupation: Retired    Fish farm manager: [employer]  Tobacco Use  . Smoking status: Never Smoker  . Smokeless tobacco: Never Used  Substance and Sexual Activity  . Alcohol use: Yes    Alcohol/week: 1.0 standard drinks    Types: 1 Cans of beer per week    Comment: OCC  . Drug use: No  . Sexual activity: Not on file  Other Topics Concern  . Not on file  Social History Narrative   Patient lives at home with his wife Olin Hauser). Patient is retired. High school education.   Right handed.   Caffeine- one or two cups daily   Social Determinants of Health   Financial Resource Strain:   . Difficulty of Paying Living Expenses: Not on file  Food Insecurity:   . Worried About Charity fundraiser in the Last  Year: Not on file  . Ran Out of Food in the Last Year: Not on file  Transportation Needs:   . Lack of Transportation (Medical): Not on file  . Lack of Transportation (Non-Medical): Not on file  Physical Activity:   . Days of Exercise per Week: Not on file  . Minutes of Exercise per Session: Not on file  Stress:   .  Feeling of Stress : Not on file  Social Connections:   . Frequency of Communication with Friends and Family: Not on file  . Frequency of Social Gatherings with Friends and Family: Not on file  . Attends Religious Services: Not on file  . Active Member of Clubs or Organizations: Not on file  . Attends Archivist Meetings: Not on file  . Marital Status: Not on file  Intimate Partner Violence:   . Fear of Current or Ex-Partner: Not on file  . Emotionally Abused: Not on file  . Physically Abused: Not on file  . Sexually Abused: Not on file   PHYSICAL EXAM  Vitals:   12/03/19 0749  BP: 122/74  Pulse: 66  Temp: (!) 97.3 F (36.3 C)  Weight: 195 lb (88.5 kg)  Height: 5' 10.5" (1.791 m)   Body mass index is 27.58 kg/m.  Generalized: Well developed, in no acute distress   Neurological examination  Mentation: Alert oriented to time, place, history taking. Follows all commands speech and language fluent Cranial nerve II-XII: Pupils were equal round reactive to light. Extraocular movements were full, visual field were full on confrontational test. Facial sensation and strength were normal. Head turning and shoulder shrug  were normal and symmetric. Motor: The motor testing reveals 5 over 5 strength of all 4 extremities. Good symmetric motor tone is noted throughout.  Sensory: Sensory testing is intact to soft touch on all 4 extremities. No evidence of extinction is noted.  Coordination: Cerebellar testing reveals good finger-nose-finger and heel-to-shin bilaterally.  Gait and station: Gait is normal. Tandem gait is mildly unsteady. Romberg is negative. No drift  is seen.  Reflexes: Deep tendon reflexes are symmetric and normal bilaterally.   DIAGNOSTIC DATA (LABS, IMAGING, TESTING) - I reviewed patient records, labs, notes, testing and imaging myself where available.  Lab Results  Component Value Date   WBC 6.7 07/24/2017   HGB 8.9 (L) 07/24/2017   HCT 27.0 (L) 07/24/2017   MCV 92.8 07/24/2017   PLT 175 07/24/2017      Component Value Date/Time   NA 139 07/24/2017 0511   K 4.2 07/24/2017 0511   CL 109 07/24/2017 0511   CO2 24 07/24/2017 0511   GLUCOSE 123 (H) 07/24/2017 0511   BUN 18 07/24/2017 0511   CREATININE 1.28 (H) 07/24/2017 0511   CALCIUM 8.2 (L) 07/24/2017 0511   PROT 6.2 (L) 07/22/2017 1651   ALBUMIN 3.2 (L) 07/22/2017 1651   AST 30 07/22/2017 1651   ALT 26 07/22/2017 1651   ALKPHOS 104 07/22/2017 1651   BILITOT 0.4 07/22/2017 1651   GFRNONAA 56 (L) 07/24/2017 0511   GFRAA >60 07/24/2017 0511   Lab Results  Component Value Date   CHOL  07/20/2007    159        ATP III CLASSIFICATION:  <200     mg/dL   Desirable  200-239  mg/dL   Borderline High  >=240    mg/dL   High   HDL 15 (L) 07/20/2007   LDLCALC  07/20/2007    85        Total Cholesterol/HDL:CHD Risk Coronary Heart Disease Risk Table                     Men   Women  1/2 Average Risk   3.4   3.3   TRIG 295 (H) 07/20/2007   CHOLHDL 10.6 07/20/2007   No results found for: HGBA1C No results found for: VITAMINB12  ASSESSMENT AND PLAN 69 y.o. year old male  has a past medical history of Cancer ([place]), Chronic renal insufficiency, Dyslipidemia, Headache(784.0), Hypertension, Memory disturbance, Obstructive sleep apnea on CPAP, Prostatitis, Renal cell carcinoma of left kidney ([place]), Rotator cuff tear, Rotator cuff tear, and Seizure ([place]). here with:  1.  Seizures 2.  Obstructive sleep apnea on CPAP  Overall, his seizures remain unchanged from baseline, his episodes are brief.  They do not interfere with his ability to function.  He will remain on Lamictal  200 mg twice a day.  I will check a Lamictal level.  He reports he feels fatigued, tired all the time.  His CPAP download indicates an AHI of 6.1, leak 22.2.  I will adjust his set pressures to 7/13 cmH2O. I encouraged him to continue to use his CPAP greater than 4 hours every night, fortunately he has excellent compliance.  He will follow-up in 3 months for recheck on CPAP changes.  Otherwise, follow-up in 1 year for seizures.  I spent 25 minutes with the patient. 50% of this time was spent discussing the plan of care, and reviewing his CPAP download.  Butler Denmark, AGNP-C, DNP 12/03/2019, 7:53 AM [facility] [address] [place], [place] [address] [phone]

## 2019-12-03 NOTE — Telephone Encounter (Signed)
Per NP [place] Silos request, faxed of CPAP orders. Awaiting confirmation fax.

## 2019-12-03 NOTE — Telephone Encounter (Signed)
Received confirmation fax.

## 2019-12-03 NOTE — Patient Instructions (Signed)
Continue taking Lamictal as prescribed, will check a level today.  Will adjust your CPAP settings today.  See you back in 3 months to check up on CPAP, 1 year for seizures

## 2019-12-04 LAB — LAMOTRIGINE LEVEL: Lamotrigine Lvl: 10.9 ug/mL (ref 2.0–20.0)

## 2019-12-05 ENCOUNTER — Telehealth: Payer: Self-pay

## 2019-12-05 NOTE — Telephone Encounter (Signed)
Sent a Mychart message to inform pt of his labs.

## 2019-12-05 NOTE — Telephone Encounter (Signed)
Patient wants to know if his CPAP level can be raised he states its not working , he wants it raised another 2pts

## 2019-12-05 NOTE — Telephone Encounter (Signed)
Called to go over recent results per NP Grandville Silos request. No answer, left a VM for him to call us back  Please relay.   "lease call the patient. Lamictal level is therapeutic at 10.9."-NP SS

## 2019-12-06 NOTE — Telephone Encounter (Signed)
Tell him to stay at current settings, lets check this on Monday, pull a download and see how things look a week after the change.

## 2019-12-10 ENCOUNTER — Telehealth: Payer: Self-pay | Admitting: Neurology

## 2019-12-10 DIAGNOSIS — G4733 Obstructive sleep apnea (adult) (pediatric): Secondary | ICD-10-CM

## 2019-12-10 NOTE — Telephone Encounter (Signed)
He hasn't had recent CPAP titration, it is possible he needs another treatment for his sleep apnea. I would suggest he talk with the sleep clinic to see which options may be available.

## 2019-12-10 NOTE — Telephone Encounter (Signed)
Called and spoke with the pt to relay NP Grandville Silos message (Please see previous).   Patient states that he doesn't want another study. Patient stipulates that he cannot feel any changes since since the previous pressure increase the previous week.    States that the leak occurs when he moves the mask away from his face "to get more air."

## 2019-12-10 NOTE — Telephone Encounter (Signed)
Please call the patient.  CPAP study, does not show change with adjustment of pressures.  AHI was 6.6, leak 95th percentile 23.5.  I will order CPAP titration, will involve a sleep study in the lab, and adjustments made from there. The sleep center will be reaching out to coordinate, please let him know.

## 2019-12-12 ENCOUNTER — Telehealth: Payer: Self-pay

## 2019-12-12 DIAGNOSIS — H02055 Trichiasis without entropian left lower eyelid: Secondary | ICD-10-CM | POA: Diagnosis not present

## 2019-12-12 DIAGNOSIS — G4733 Obstructive sleep apnea (adult) (pediatric): Secondary | ICD-10-CM

## 2019-12-12 DIAGNOSIS — H40033 Anatomical narrow angle, bilateral: Secondary | ICD-10-CM | POA: Diagnosis not present

## 2019-12-12 NOTE — Telephone Encounter (Signed)
Called patient to discuss the cpap titration to get his AHI better. I explained that sometimes we need to change his setting to a different mode called Bipap. This would require a sleep study. He expressed with  his last machine he never had a problem.At that time he was on a set pressure and not Auto. He felt better with that also. Can you send an order to [facility] and have his setting change to cpap of 13. His last download showed this to be the best setting at the 95th percentile. We can look at another download in a few weeks and if his AHI is still high then we can bring him in for a repeat sleep study. He is willing to do this. thanks

## 2019-12-12 NOTE — Telephone Encounter (Signed)
Received confirmation fax.

## 2019-12-12 NOTE — Telephone Encounter (Addendum)
Faxed orders to [facility] awaiting confirmation fax. (Please see below.)

## 2019-12-12 NOTE — Telephone Encounter (Signed)
Please fax the orders to [facility] for changes (see Robin's note).

## 2019-12-12 NOTE — Telephone Encounter (Signed)
Received transmission log.

## 2019-12-12 NOTE — Telephone Encounter (Signed)
Please see message between [facility] and Butler Denmark NP on 12/12/19.

## 2019-12-17 ENCOUNTER — Telehealth: Payer: Self-pay

## 2019-12-17 DIAGNOSIS — G4733 Obstructive sleep apnea (adult) (pediatric): Secondary | ICD-10-CM

## 2019-12-17 NOTE — Telephone Encounter (Signed)
Patient called and would like for pressure to be turned down some. He feels 13 is too much pressure. I told him we could go down to 12 but after that we would need an in lab study to continue making changes. Can you send order for cpap 12? Thanks

## 2019-12-18 NOTE — Telephone Encounter (Signed)
Please send order to CPAP supplier for change in setting to 12.

## 2019-12-18 NOTE — Telephone Encounter (Signed)
Faxed over orders per provider request to [place] ([place]).

## 2019-12-26 DIAGNOSIS — H40031 Anatomical narrow angle, right eye: Secondary | ICD-10-CM | POA: Diagnosis not present

## 2019-12-27 ENCOUNTER — Telehealth: Payer: Self-pay

## 2019-12-27 NOTE — Telephone Encounter (Signed)
Called patient to see how he is doing on CPAP 12. He loves it. Said he is sleeping through night and can tell a difference. The download is scanned under media. AHI is 3.4. Much better. He will call if anything changes. I will delete the CPAP titration order.

## 2020-01-16 DIAGNOSIS — I10 Essential (primary) hypertension: Secondary | ICD-10-CM | POA: Diagnosis not present

## 2020-01-29 DIAGNOSIS — I10 Essential (primary) hypertension: Secondary | ICD-10-CM | POA: Diagnosis not present

## 2020-01-29 DIAGNOSIS — E7800 Pure hypercholesterolemia, unspecified: Secondary | ICD-10-CM | POA: Diagnosis not present

## 2020-01-29 DIAGNOSIS — N183 Chronic kidney disease, stage 3 unspecified: Secondary | ICD-10-CM | POA: Diagnosis not present

## 2020-01-29 DIAGNOSIS — E119 Type 2 diabetes mellitus without complications: Secondary | ICD-10-CM | POA: Diagnosis not present

## 2020-02-21 ENCOUNTER — Ambulatory Visit: Payer: Medicare Other | Admitting: Adult Health

## 2020-03-05 ENCOUNTER — Ambulatory Visit: Payer: Medicare Other | Admitting: Adult Health

## 2020-03-17 DIAGNOSIS — H40033 Anatomical narrow angle, bilateral: Secondary | ICD-10-CM | POA: Diagnosis not present

## 2020-03-17 DIAGNOSIS — H0102A Squamous blepharitis right eye, upper and lower eyelids: Secondary | ICD-10-CM | POA: Diagnosis not present

## 2020-03-17 DIAGNOSIS — H2513 Age-related nuclear cataract, bilateral: Secondary | ICD-10-CM | POA: Diagnosis not present

## 2020-03-17 DIAGNOSIS — H0102B Squamous blepharitis left eye, upper and lower eyelids: Secondary | ICD-10-CM | POA: Diagnosis not present

## 2020-03-17 DIAGNOSIS — H43393 Other vitreous opacities, bilateral: Secondary | ICD-10-CM | POA: Diagnosis not present

## 2020-03-17 DIAGNOSIS — H04123 Dry eye syndrome of bilateral lacrimal glands: Secondary | ICD-10-CM | POA: Diagnosis not present

## 2020-03-17 DIAGNOSIS — H02055 Trichiasis without entropian left lower eyelid: Secondary | ICD-10-CM | POA: Diagnosis not present

## 2020-03-19 DIAGNOSIS — Z79899 Other long term (current) drug therapy: Secondary | ICD-10-CM | POA: Diagnosis not present

## 2020-03-19 DIAGNOSIS — N1831 Chronic kidney disease, stage 3a: Secondary | ICD-10-CM | POA: Diagnosis not present

## 2020-03-19 DIAGNOSIS — Z9989 Dependence on other enabling machines and devices: Secondary | ICD-10-CM | POA: Diagnosis not present

## 2020-03-19 DIAGNOSIS — D631 Anemia in chronic kidney disease: Secondary | ICD-10-CM | POA: Diagnosis not present

## 2020-03-19 DIAGNOSIS — Z85528 Personal history of other malignant neoplasm of kidney: Secondary | ICD-10-CM | POA: Diagnosis not present

## 2020-03-19 DIAGNOSIS — N183 Chronic kidney disease, stage 3 unspecified: Secondary | ICD-10-CM | POA: Diagnosis not present

## 2020-03-19 DIAGNOSIS — N400 Enlarged prostate without lower urinary tract symptoms: Secondary | ICD-10-CM | POA: Diagnosis not present

## 2020-03-19 DIAGNOSIS — G4700 Insomnia, unspecified: Secondary | ICD-10-CM | POA: Diagnosis not present

## 2020-03-19 DIAGNOSIS — I129 Hypertensive chronic kidney disease with stage 1 through stage 4 chronic kidney disease, or unspecified chronic kidney disease: Secondary | ICD-10-CM | POA: Diagnosis not present

## 2020-03-19 DIAGNOSIS — G4733 Obstructive sleep apnea (adult) (pediatric): Secondary | ICD-10-CM | POA: Diagnosis not present

## 2020-03-19 DIAGNOSIS — Z905 Acquired absence of kidney: Secondary | ICD-10-CM | POA: Diagnosis not present

## 2020-03-28 DIAGNOSIS — R569 Unspecified convulsions: Secondary | ICD-10-CM | POA: Diagnosis not present

## 2020-03-28 DIAGNOSIS — K4090 Unilateral inguinal hernia, without obstruction or gangrene, not specified as recurrent: Secondary | ICD-10-CM | POA: Diagnosis not present

## 2020-04-01 ENCOUNTER — Telehealth: Payer: Self-pay | Admitting: Adult Health

## 2020-04-01 NOTE — Telephone Encounter (Signed)
Returned call to pt.  LMVM to return call.

## 2020-04-01 NOTE — Telephone Encounter (Signed)
Pt called and LVM stating that he has been having issues with the anesthesia when he gets surgery. Pt states he will have seizure episodes and he is to get surgery again and is wanting to discuss with provider first. Please advise.

## 2020-04-03 NOTE — Telephone Encounter (Signed)
I called the patient.  The patient indicates that in the past when he has gotten general anesthesia he is quite confused, disoriented for 3 to 4 days afterwards.  He is not sure that this represents a seizure but it clearly is how he reacts to the anesthesia.  He will be having hernia surgery in July 2021.  They are planning on sending him home directly from the hospital following the surgery, he wonders whether it may not be a good idea to keep him in the hospital overnight to observe.  Particular if the patient has to miss a dose of the Lamictal, he can get a loading dose of 1 g of phenytoin prior to the surgery and then getting back on oral Lamictal as soon as possible afterwards.  The patient may be very sensitive to general anesthesia, certainly Dr. Lucia Gaskins does need to be aware of these issues.

## 2020-04-03 NOTE — Telephone Encounter (Signed)
LMVM for pt on home and mobile.  This is 3rd message left , I will wait to hear from him again.

## 2020-04-03 NOTE — Telephone Encounter (Signed)
I called spoke to wife, then the phone disconnected.  She stated that pt has had issues with having seizures when he has anesthesia (for surgery).  She wanted to speak to you about this.

## 2020-04-03 NOTE — Telephone Encounter (Signed)
Pt returned call and LVM. Please call back when available. Pt states you may have to dial a 1 before his number if it does not go through.

## 2020-04-14 ENCOUNTER — Other Ambulatory Visit: Payer: Self-pay | Admitting: Surgery

## 2020-05-13 DIAGNOSIS — M7041 Prepatellar bursitis, right knee: Secondary | ICD-10-CM | POA: Diagnosis not present

## 2020-05-13 DIAGNOSIS — M25561 Pain in right knee: Secondary | ICD-10-CM | POA: Diagnosis not present

## 2020-05-13 NOTE — Patient Instructions (Addendum)
DUE TO COVID-19 ONLY ONE VISITOR IS ALLOWED TO COME WITH YOU AND STAY IN THE WAITING ROOM ONLY DURING PRE OP AND PROCEDURE DAY OF SURGERY. TWO VISITOR MAY VISIT WITH YOU AFTER SURGERY IN YOUR PRIVATE ROOM DURING VISITING HOURS ONLY!  10a-8p  YOU NEED TO HAVE A COVID 19 TEST ON__7-26-21_____ @_______ , THIS TEST MUST BE DONE BEFORE SURGERY, COME  [address], [place] , [address].  ([place]) ONCE YOUR COVID TEST IS COMPLETED, PLEASE BEGIN THE QUARANTINE INSTRUCTIONS AS OUTLINED IN YOUR HANDOUT.                Kenneth Ortega  05/13/2020   Your procedure is scheduled on: 05-22-20             BRING CPAP MASK AND TUBING ONLY   Report to [facility] Main  Entrance   Report to admitting at     0830  AM     Call this number if you have problems the morning of surgery [phone]    Remember: Do not eat food :After Midnight.you may have clear liquids until      0730 am then nothing by mouth    CLEAR LIQUID DIET   Foods Allowed                                                                                          Foods Excluded  Coffee and tea, regular and decaf No CREAMER                             liquids that you cannot  Plain Jell-O any favor except red or purple                                           see through such as: Fruit ices (not with fruit pulp)                                                                 milk, soups, orange juice  Iced Popsicles                                                                  All solid food Carbonated beverages, regular and diet                                    Cranberry, grape and apple juices Sports drinks like Gatorade Lightly seasoned clear broth or  consume(fat free) Sugar, honey syrup  _____________________________________________________________________     BRUSH YOUR TEETH MORNING OF SURGERY AND RINSE YOUR MOUTH OUT, NO CHEWING GUM CANDY OR MINTS.     Take these medicines the morning  of surgery with A SIP OF WATER: Flomax, lamictal, prozac  Hold Farxiga day before surgery DO NOT TAKE ANY DIABETIC MEDICATIONS DAY OF YOUR SURGERY                               You may not have any metal on your body including hair pins and              piercings  Do not wear jewelry,  lotions, powders or perfumes, deodorant                    Men may shave face and neck.   Do not bring valuables to the hospital. [place].  Contacts, dentures or bridgework may not be worn into surgery.      Patients discharged the day of surgery will not be allowed to drive home. IF YOU ARE HAVING SURGERY AND GOING HOME THE SAME DAY, YOU MUST HAVE AN ADULT TO DRIVE YOU HOME AND BE WITH YOU FOR 24 HOURS. YOU MAY GO HOME BY TAXI OR UBER OR ORTHERWISE, BUT AN ADULT MUST ACCOMPANY YOU HOME AND STAY WITH YOU FOR 24 HOURS.  Name and phone number of your driver:  Special Instructions: N/A              Please read over the following fact sheets you were given: _____________________________________________________________________             [facility] - Preparing for Surgery Before surgery, you can play an important role.  Because skin is not sterile, your skin needs to be as free of germs as possible.  You can reduce the number of germs on your skin by washing with CHG (chlorahexidine gluconate) soap before surgery.  CHG is an antiseptic cleaner which kills germs and bonds with the skin to continue killing germs even after washing. Please DO NOT use if you have an allergy to CHG or antibacterial soaps.  If your skin becomes reddened/irritated stop using the CHG and inform your nurse when you arrive at Short Stay. Do not shave (including legs and underarms) for at least 48 hours prior to the first CHG shower.  You may shave your face/neck. Please follow these instructions carefully:  1.  Shower with CHG Soap the night before surgery and the  morning of  Surgery.  2.  If you choose to wash your hair, wash your hair first as usual with your  normal  shampoo.  3.  After you shampoo, rinse your hair and body thoroughly to remove the  shampoo.                           4.  Use CHG as you would any other liquid soap.  You can apply chg directly  to the skin and wash                       Gently with a scrungie or clean washcloth.  5.  Apply the CHG Soap to your body ONLY FROM THE NECK DOWN.  Do not use on face/ open                           Wound or open sores. Avoid contact with eyes, ears mouth and genitals (private parts).                       Wash face,  Genitals (private parts) with your normal soap.             6.  Wash thoroughly, paying special attention to the area where your surgery  will be performed.  7.  Thoroughly rinse your body with warm water from the neck down.  8.  DO NOT shower/wash with your normal soap after using and rinsing off  the CHG Soap.                9.  Pat yourself dry with a clean towel.            10.  Wear clean pajamas.            11.  Place clean sheets on your bed the night of your first shower and do not  sleep with pets. Day of Surgery : Do not apply any lotions/deodorants the morning of surgery.  Please wear clean clothes to the hospital/surgery center.  FAILURE TO FOLLOW THESE INSTRUCTIONS MAY RESULT IN THE CANCELLATION OF YOUR SURGERY PATIENT SIGNATURE_________________________________  NURSE SIGNATURE__________________________________  ________________________________________________________________________

## 2020-05-13 NOTE — Progress Notes (Addendum)
PCP - Kenneth Ortega .MD Cardiologist - no Neuro -Dr. [name] Telephone note epic 04-03-20  PPM/ICD -  Device Orders -  Rep Notified -   Chest x-ray -  EKG - 05-14-20 Stress Test -  ECHO -  Cardiac Cath -   Sleep Study -  CPAP -   Fasting Blood Sugar -  Checks Blood Sugar _0____ times a day  Blood Thinner Instructions: Aspirin Instructions:  [facility] - PRE-SURGERY   COVID TEST- 7-26  Activity- Does yard work, goes to [employer] Anesthesia review: OSA CPAP , DM , absent seizures,  Patient denies shortness of breath, fever, cough and chest pain at PAT appointment  none   All instructions explained to the patient, with a verbal understanding of the material. Patient agrees to go over the instructions while at home for a better understanding. Patient also instructed to self quarantine after being tested for COVID-19. The opportunity to ask questions was provided.

## 2020-05-14 ENCOUNTER — Encounter (HOSPITAL_COMMUNITY): Payer: Self-pay

## 2020-05-14 ENCOUNTER — Other Ambulatory Visit: Payer: Self-pay

## 2020-05-14 ENCOUNTER — Encounter (HOSPITAL_COMMUNITY)
Admission: RE | Admit: 2020-05-14 | Discharge: 2020-05-14 | Disposition: A | Discharge status: Home / Self Care | Payer: Medicare Other | Source: Ambulatory Visit | Attending: Surgery | Admitting: Surgery

## 2020-05-14 DIAGNOSIS — Z01818 Encounter for other preprocedural examination: Secondary | ICD-10-CM | POA: Insufficient documentation

## 2020-05-14 HISTORY — DX: Other complications of anesthesia, initial encounter: T88.59XA

## 2020-05-14 HISTORY — DX: Personal history of urinary calculi: Z87.442

## 2020-05-14 HISTORY — DX: Type 2 diabetes mellitus without complications: E11.9

## 2020-05-14 HISTORY — DX: Depression, unspecified: F32.A

## 2020-05-14 LAB — CBC
HCT: 44.5 % (ref 39.0–52.0)
Hemoglobin: 14.1 g/dL (ref 13.0–17.0)
MCH: 30.3 pg (ref 26.0–34.0)
MCHC: 31.7 g/dL (ref 30.0–36.0)
MCV: 95.5 fL (ref 80.0–100.0)
Platelets: 185 10*3/uL (ref 150–400)
RBC: 4.66 MIL/uL (ref 4.22–5.81)
RDW: 12.4 % (ref 11.5–15.5)
WBC: 6.7 10*3/uL (ref 4.0–10.5)
nRBC: 0 % (ref 0.0–0.2)

## 2020-05-14 LAB — BASIC METABOLIC PANEL
Anion gap: 8 (ref 5–15)
BUN: 23 mg/dL (ref 8–23)
CO2: 27 mmol/L (ref 22–32)
Calcium: 9.6 mg/dL (ref 8.9–10.3)
Chloride: 104 mmol/L (ref 98–111)
Creatinine, Ser: 1.36 mg/dL — ABNORMAL HIGH (ref 0.61–1.24)
GFR calc Af Amer: >60 mL/min (ref >60)
GFR calc non Af Amer: 52 mL/min — ABNORMAL LOW (ref >60)
Glucose, Bld: 77 mg/dL (ref 70–99)
Potassium: 4.2 mmol/L (ref 3.5–5.1)
Sodium: 139 mmol/L (ref 135–145)

## 2020-05-14 LAB — GLUCOSE, CAPILLARY: Glucose-Capillary: 87 mg/dL (ref 70–99)

## 2020-05-14 LAB — HEMOGLOBIN A1C
Hgb A1c MFr Bld: 6.4 % — ABNORMAL HIGH (ref 4.8–5.6)
Mean Plasma Glucose: 136.98 mg/dL

## 2020-05-19 ENCOUNTER — Other Ambulatory Visit (HOSPITAL_COMMUNITY)
Admission: RE | Admit: 2020-05-19 | Discharge: 2020-05-19 | Disposition: A | Discharge status: Home / Self Care | Payer: Medicare Other | Source: Ambulatory Visit | Attending: Surgery | Admitting: Surgery

## 2020-05-19 DIAGNOSIS — Z20822 Contact with and (suspected) exposure to covid-19: Secondary | ICD-10-CM | POA: Diagnosis not present

## 2020-05-19 DIAGNOSIS — Z01812 Encounter for preprocedural laboratory examination: Secondary | ICD-10-CM | POA: Insufficient documentation

## 2020-05-19 LAB — SARS CORONAVIRUS 2 (TAT 6-24 HRS): SARS Coronavirus 2: NEGATIVE

## 2020-05-19 NOTE — Progress Notes (Addendum)
Anesthesia Chart Review   Case: [id number] Date/Time: 05/22/20 1015   Procedure: OPEN RIGHT INGUINAL HERNIA REPAIR (Right )   Anesthesia type: General   Pre-op diagnosis: right ingunial hernia   Location: [facility] ROOM 04 / [facility]   Surgeons: Alphonsa Overall, MD      DISCUSSION:69 y.o. never smoker with h/o OSA with CPAP, DM II, HTN, Seizures (managed on Lamictal, follows with neurology), right inguinal hernia scheduled for above procedure 05/22/2020 with Dr. Alphonsa Overall.   Last seen by neurology 12/03/2019.  Per OV note, seizures remain unchanged from baseline with brief daily episodes that do not interfere with his ability to function.    Per Telephone note from neurologist Dr. Margette Fast on 04/03/2020, "I called the patient.  The patient indicates that in the past when he has gotten general anesthesia he is quite confused, disoriented for 3 to 4 days afterwards.  He is not sure that this represents a seizure but it clearly is how he reacts to the anesthesia. He will be having hernia surgery in July 2021.  They are planning on sending him home directly from the hospital following the surgery, he wonders whether it may not be a good idea to keep him in the hospital overnight to observe. Particular if the patient has to miss a dose of the Lamictal, he can get a loading dose of 1 g of phenytoin prior to the surgery and then getting back on oral Lamictal as soon as possible afterwards. The patient may be very sensitive to general anesthesia, certainly Dr. Lucia Gaskins does need to be aware of these issues."  S/p knee surgery 07/18/2017, no anesthesia complications noted.  Readmitted after procedure due to acute metabolic encephalopathy secondary to pain medication overuse.  Per discharge summary, "presentation less likely to be consistent with seizure etiology."  Anticipate pt can proceed with planned procedure barring acute status change.   VS: BP 140/72   Pulse 68   Temp 36.6 C (Oral)   Resp 16   Ht 5'  10.5" (1.791 m)   Wt 88.9 kg   SpO2 98%   BMI 27.73 kg/m   PROVIDERS: [name], MD is PCP   Margette Fast, MD is Neurologist  LABS: Labs reviewed: Acceptable for surgery. (all labs ordered are listed, but only abnormal results are displayed)  Labs Reviewed  HEMOGLOBIN A1C - Abnormal; Notable for the following components:      Result Value   Hgb A1c MFr Bld 6.4 (*)    All other components within normal limits  BASIC METABOLIC PANEL - Abnormal; Notable for the following components:   Creatinine, Ser 1.36 (*)    GFR calc non Af Amer 52 (*)    All other components within normal limits  CBC  GLUCOSE, CAPILLARY     IMAGES:   EKG: 05/14/20 Rate 62 bpm  NSR Left axis deviation   CV:  Past Medical History:  Diagnosis Date  . Cancer ([place])    Skin, melanoma   back  and basal cell forhead  . Chronic renal insufficiency   . Complication of anesthesia    BP flucuates up and down  , dizziness   . Depression   . Diabetes mellitus without complication ([place])   . Dyslipidemia   . Headache(784.0)    Migraine as child  . History of kidney stones   . Hypertension   . Memory disturbance   . Obstructive sleep apnea on CPAP   . Prostatitis   . Renal cell  carcinoma of left kidney ([place])    removed 2007  . Rotator cuff tear    Left  . Rotator cuff tear    left shoulder   . Seizure ([place])    hx since 1996/7 ; per patient , has short bursts of seizures characterized with expressive aphasia, managed by neurologist Dr Jannifer Franklin    Past Surgical History:  Procedure Laterality Date  . Arthroscopic surgery     Left knee  . INCISIONAL HERNIA REPAIR     Left flank  . KIDNEY SURGERY Left 2007   Removed due to cancer; per patient " my other kidney is functioning great right now"  . Pilonidal cyst resection    . SKIN CANCER DESTRUCTION    . TOTAL KNEE ARTHROPLASTY Left 07/18/2017   Procedure: LEFT TOTAL KNEE ARTHROPLASTY;  Surgeon: Gaynelle Arabian, MD;  Location: [facility];  Service:  Orthopedics;  Laterality: Left;    MEDICATIONS: . BENICAR 5 MG tablet  . dapagliflozin propanediol (FARXIGA) 5 MG TABS tablet  . FLUoxetine (PROZAC) 20 MG capsule  . gabapentin (NEURONTIN) 300 MG capsule  . lamoTRIgine (LAMICTAL) 200 MG tablet  . simvastatin (ZOCOR) 10 MG tablet  . simvastatin (ZOCOR) 20 MG tablet  . tamsulosin (FLOMAX) 0.4 MG CAPS capsule   No current facility-administered medications for this encounter.     Konrad Felix, PA-C [facility] Pre-Surgical Testing [phone]

## 2020-05-19 NOTE — Anesthesia Preprocedure Evaluation (Addendum)
Anesthesia Evaluation  Patient identified by MRN, date of birth, ID band Patient awake    Reviewed: Allergy & Precautions, NPO status , Patient's Chart, lab work & pertinent test results  History of Anesthesia Complications (+) history of anesthetic complications (memory problems after surgery)  Airway Mallampati: II  TM Distance: >3 FB Neck ROM: Full    Dental  (+) Teeth Intact, Dental Advisory Given, Chipped,  Upper permanent bridge :   Pulmonary sleep apnea and Continuous Positive Airway Pressure Ventilation ,    Pulmonary exam normal breath sounds clear to auscultation       Cardiovascular hypertension, Pt. on medications Normal cardiovascular exam Rhythm:Regular Rate:Normal     Neuro/Psych  Headaches, Seizures -, Poorly Controlled,  PSYCHIATRIC DISORDERS Depression    GI/Hepatic Neg liver ROS, right ingunial hernia    Endo/Other  diabetes, Type 2, Oral Hypoglycemic Agents  Renal/GU Renal InsufficiencyRenal diseaseRenal cell carcinoma of left kidney s/p nephrectomy      Musculoskeletal  (+) Arthritis ,   Abdominal   Peds  Hematology negative hematology ROS (+)   Anesthesia Other Findings Day of surgery medications reviewed with the patient.  Reproductive/Obstetrics                           Anesthesia Physical Anesthesia Plan  ASA: III  Anesthesia Plan: General   Post-op Pain Management:    Induction: Intravenous  PONV Risk Score and Plan: 3 and Dexamethasone, Ondansetron, Treatment may vary due to age or medical condition and TIVA  Airway Management Planned: Oral ETT  Additional Equipment:   Intra-op Plan:   Post-operative Plan: Extubation in OR  Informed Consent: I have reviewed the patients History and Physical, chart, labs and discussed the procedure including the risks, benefits and alternatives for the proposed anesthesia with the patient or authorized  representative who has indicated his/her understanding and acceptance.     Dental advisory given  Plan Discussed with: CRNA  Anesthesia Plan Comments: (H/o OSA with CPAP, DM II, HTN, Seizures (managed on Lamictal, follows with neurology).  Last seen by neurology 12/03/2019.  Per OV note, seizures remain unchanged from baseline with brief daily episodes that do not interfere with his ability to function.    Per Telephone note from neurologist Dr. Margette Fast on 04/03/2020, "I called the patient. The patient indicates that in the past when he has gotten general anesthesia he is quite confused, disoriented for 3 to 4 days afterwards. He is not sure that this represents a seizure but it clearly is how he reacts to the anesthesia. He will be having hernia surgery in July 2021. They are planning on sending him home directly from the hospital following the surgery, he wonders whether it may not be a good idea to keep him in the hospital overnight to observe. Particular if the patient has to miss a dose of the Lamictal, he can get a loading dose of 1 g of phenytoin prior to the surgery and then getting back on oral Lamictal as soon as possible afterwards. The patient may be very sensitive to general anesthesia, certainly Dr. Lucia Gaskins does need to be aware of these issues."  Message forwarded to Dr. Lucia Gaskins to ensure he had seen this telephone note from Dr. Jannifer Franklin.  )       Anesthesia Quick Evaluation

## 2020-05-21 NOTE — H&P (Signed)
Kenneth Ortega  Location: [facility] Surgery Patient #: [id number] DOB: [birth year] Married / Language: English / Race: White Male  History of Present Illness   The patient is a 69 year old male who presents with a complaint of hernia.  The PCP is Dr. Georges Mouse  The patient was referred by Dr. Lana Fish He comes by himself.  [The Covid-19 virus has disrupted normal medical care in [place] and across the nation. We have sometimes had to alter normal surgical/medical care to limit this epidemic and we have explained these changes to the patient.]  It seems that he is having more localized symptoms to the right groin than when I saw him in December 2020. He describes lower abdominal pain which tends to localize to the right groin and then goes to the right testicle. This does not happen every day, but happens some days. It does not happen at night. He does not see a bulge in his groin. He talked about his seizures associated with surgery and his desire to spend the night in the hospital after surgery. I recommended that he contact Dr. Jannifer Franklin about any preop evaluation he would need. His seizure history is odd, in that he says he does not know that he is having them, but his wife can tell.  I discussed the indications and complications of hernia surgery with the patient. I discussed both the laparoscopic and open approach to hernia repair.. The potential risks of hernia surgery include, but are not limited to, bleeding, infection, open surgery, nerve injury, and recurrence of the hernia. I provided the patient literature about hernia surgery. We talked about the use of mesh in hernias and their risks. The risk of mesh include chronic infection, erosion to other structures, and chronic pain. He talked about with any surgery, he has trouble with his seizures. So we will do him as a 24 hour observation - at a hospital setting. I also told him to get in touch  with Dr. Tobey Grim office to see if he needs an evaluation prior to surgery. We talked about continued observation vs proceeding with surgery. He would rather fix the hernia early than wait.  Plan: 1. Proceed with open right inguinal hernia repair  History of abdominal pain (Dec 2020): He noticed about a year ago when he was working with a chain saw some lower abdominal pain. He now notices whenever he stands for a while, he has a lower abdominal pain. It does not lateralize. He's not noticed a mass in his groin or abdominal wall. He has no testicular pain. His bowels are normal. He said lifting does not affect the pain. And he has no pain when he is lying down. He's had no prior history of inguinal hernia surgery or problems. Dr. Zella Richer repaired a left flank hernia on him laparoscopically in 2018. He saw Dr. Earlie Raveling for an evaluation. Dr. Earlean Shawl obtained a CT scan. He had a CT scan of the abdomen (done at [facility]) on 04/24/2019 - which showed: small right inguinal hernia, colonic diverticulosis, mildly enlarged prostate. I do not have access to the images today. But if not for the CT scan, I'm not sure that I would've found a right inguinal hernia on him.  Review of Systems as stated in this history (HPI) or in the review of systems. Otherwise all other 12 point ROS are negative  Past Medical History: 1. History of seizures On Lamictal Sees Dr. Jannifer Franklin (Maryjean Ka, NP) - Dr. Jannifer Franklin is  retired He goes into this discussion that with every anesthesia - he has had issues with his seizures. I have told him to contact Dr. Tobey Grim office about anything we need to do pre op for surgery 2. OSA On CPAP 3. HTN x 10 years 4. Renal cell ca of the left kidney Left nephrectomy - 2007 - Ottelin 5. Left total knee - 2018 - Alusio This is sore, but doing well 6. Left flank hernia repair, laparoscopic - 07/13/2007 -  [facility]osen[facility]ower 7. BPH - he was on Flomax, but has recently stopped this 8. History of melanoma of the back - taken care of at [facility] about 3 or 4 years ago  Social History: Married, wife Olin Hauser No children. He is a retired 2009 from [employer]. 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   Allergies (April Staton, CMA; 03/28/2020 3:12 PM) Hydrocodone-Acetaminophen *ANALGESICS - OPIOID*   Medication History (April Staton, CMA; 03/28/2020 3:12 PM) Simvastatin (10MG  Tablet, Oral) Active. Farxiga (5MG  Tablet, Oral) Active. FLUoxetine HCl (20MG  Capsule, Oral) Active. lamoTRIgine (200MG  Tablet, Oral) Active. Olmesartan Medoxomil (5MG  Tablet, Oral) Active. Flomax (0.4MG  Capsule, Oral) Active. Medications Reconciled  Vitals (April Staton CMA; 03/28/2020 3:12 PM) 03/28/2020 3:12 PM Weight: 191.13 lb Height: 70in Body Surface Area: 2.05 m Body Mass Index: 27.42 kg/m  Temp.: 97.106F (Oral)  Pulse: 68 (Regular)  P.OX: 94% (Room air) BP: 94/68(Sitting, Left Arm, Standard)   Physical Exam  General: WN older WM who is alert and generally healthy appearing. He is wearing a mask. HEENT: Normal. Pupils equal.  Neck: Supple. No mass. No thyroid mass.  Lymph Nodes: No supraclavicular or cervical nodes.  Lungs: Clear to auscultation and symmetric breath sounds. Heart: RRR. No murmur or rub.  Abdomen: Soft. No mass. No tenderness. No hernia. Normal bowel sounds.   On standing, I feel a small right inguinal hernia. I think that it is a little larger than what I felt in December 2020. I do not feel a left inguinal hernia. Rectal: Not done.  Extremities: Good strength and ROM in upper and lower extremities.  Assessment & Plan  1.  INGUINAL HERNIA OF RIGHT SIDE WITHOUT OBSTRUCTION OR GANGRENE (K40.90) Impression: Plan:   Open right inguinal hernia repair with overnight stay  2.  History of seizures On Lamictal Sees Dr. Jannifer Franklin (Maryjean Ka, NP)  - Dr. Jannifer Franklin is retired He goes into this discussion that with every anesthesia - he has had issues with his seizures. I have told him to contact Dr. Tobey Grim office about anything we need to do pre op for surgery 3. OSA On CPAP 4. HTN x 10 years 5. Renal cell ca of the left kidney Left nephrectomy - 2007 - Ottelin 6. Left flank hernia repair, laparoscopic - 07/13/2007 - [facility] 7. BPH - he was on Flomax, but has recently stopped this 8. History of melanoma of the back - taken care of at [facility] about 3 or 4 years ago     Alphonsa Overall, MD, [facility] Surgery Office phone:  [phone]

## 2020-05-22 ENCOUNTER — Encounter (HOSPITAL_COMMUNITY): Payer: Self-pay | Admitting: Surgery

## 2020-05-22 ENCOUNTER — Ambulatory Visit (HOSPITAL_COMMUNITY): Payer: Medicare Other | Admitting: Registered Nurse

## 2020-05-22 ENCOUNTER — Other Ambulatory Visit: Payer: Self-pay

## 2020-05-22 ENCOUNTER — Ambulatory Visit (HOSPITAL_COMMUNITY): Payer: Medicare Other | Admitting: Physician Assistant

## 2020-05-22 ENCOUNTER — Encounter (HOSPITAL_COMMUNITY)
Admission: RE | Disposition: A | Discharge status: Home / Self Care | Payer: Self-pay | Source: Home / Self Care | Attending: Surgery

## 2020-05-22 ENCOUNTER — Observation Stay (HOSPITAL_COMMUNITY)
Admission: RE | Admit: 2020-05-22 | Discharge: 2020-05-23 | Disposition: A | Discharge status: Home / Self Care | Payer: Medicare Other | Attending: Surgery | Admitting: Surgery

## 2020-05-22 DIAGNOSIS — I10 Essential (primary) hypertension: Secondary | ICD-10-CM | POA: Diagnosis not present

## 2020-05-22 DIAGNOSIS — Z79899 Other long term (current) drug therapy: Secondary | ICD-10-CM | POA: Diagnosis not present

## 2020-05-22 DIAGNOSIS — Z96652 Presence of left artificial knee joint: Secondary | ICD-10-CM | POA: Diagnosis not present

## 2020-05-22 DIAGNOSIS — G4733 Obstructive sleep apnea (adult) (pediatric): Secondary | ICD-10-CM | POA: Diagnosis not present

## 2020-05-22 DIAGNOSIS — Z8669 Personal history of other diseases of the nervous system and sense organs: Secondary | ICD-10-CM | POA: Insufficient documentation

## 2020-05-22 DIAGNOSIS — E119 Type 2 diabetes mellitus without complications: Secondary | ICD-10-CM | POA: Diagnosis not present

## 2020-05-22 DIAGNOSIS — K4090 Unilateral inguinal hernia, without obstruction or gangrene, not specified as recurrent: Principal | ICD-10-CM | POA: Insufficient documentation

## 2020-05-22 DIAGNOSIS — Z85528 Personal history of other malignant neoplasm of kidney: Secondary | ICD-10-CM | POA: Diagnosis not present

## 2020-05-22 HISTORY — PX: INGUINAL HERNIA REPAIR: SHX194

## 2020-05-22 LAB — GLUCOSE, CAPILLARY
Glucose-Capillary: 131 mg/dL — ABNORMAL HIGH (ref 70–99)
Glucose-Capillary: 125 mg/dL — ABNORMAL HIGH (ref 70–99)

## 2020-05-22 SURGERY — REPAIR, HERNIA, INGUINAL, ADULT
Anesthesia: General | Laterality: Right

## 2020-05-22 MED ORDER — ROCURONIUM BROMIDE 10 MG/ML (PF) SYRINGE
PREFILLED_SYRINGE | INTRAVENOUS | Status: AC
  Filled 2020-05-22: qty 10

## 2020-05-22 MED ORDER — LIDOCAINE 2% (20 MG/ML) 5 ML SYRINGE
INTRAMUSCULAR | Status: AC
  Filled 2020-05-22: qty 5

## 2020-05-22 MED ORDER — LIDOCAINE 2% (20 MG/ML) 5 ML SYRINGE
INTRAMUSCULAR | Status: DC | PRN
  Administered 2020-05-22: 60 mg via INTRAVENOUS

## 2020-05-22 MED ORDER — BUPIVACAINE HCL 0.25 % IJ SOLN
INTRAMUSCULAR | Status: AC
  Filled 2020-05-22: qty 1

## 2020-05-22 MED ORDER — CHLORHEXIDINE GLUCONATE 0.12 % MT SOLN
15.0000 mL | Freq: Once | OROMUCOSAL | Status: AC
  Administered 2020-05-22: 15 mL via OROMUCOSAL

## 2020-05-22 MED ORDER — FENTANYL CITRATE (PF) 100 MCG/2ML IJ SOLN: 25.0000 ug | INTRAMUSCULAR | Status: DC | PRN

## 2020-05-22 MED ORDER — DEXAMETHASONE SODIUM PHOSPHATE 10 MG/ML IJ SOLN
INTRAMUSCULAR | Status: DC | PRN
  Administered 2020-05-22: 8 mg via INTRAVENOUS

## 2020-05-22 MED ORDER — SUGAMMADEX SODIUM 200 MG/2ML IV SOLN
INTRAVENOUS | Status: DC | PRN
  Administered 2020-05-22: 200 mg via INTRAVENOUS

## 2020-05-22 MED ORDER — ORAL CARE MOUTH RINSE: 15.0000 mL | Freq: Once | OROMUCOSAL | Status: AC

## 2020-05-22 MED ORDER — FLUOXETINE HCL 20 MG PO CAPS: 20.0000 mg | ORAL_CAPSULE | Freq: Every day | ORAL | Status: DC

## 2020-05-22 MED ORDER — ACETAMINOPHEN 500 MG PO TABS
1000.0000 mg | ORAL_TABLET | Freq: Four times a day (QID) | ORAL | Status: DC
  Administered 2020-05-22 – 2020-05-23 (×3): 1000 mg via ORAL
  Filled 2020-05-22 (×3): qty 2

## 2020-05-22 MED ORDER — FENTANYL CITRATE (PF) 100 MCG/2ML IJ SOLN
INTRAMUSCULAR | Status: DC | PRN
  Administered 2020-05-22: 50 ug via INTRAVENOUS
  Administered 2020-05-22: 75 ug via INTRAVENOUS
  Administered 2020-05-22: 25 ug via INTRAVENOUS

## 2020-05-22 MED ORDER — MORPHINE SULFATE (PF) 2 MG/ML IV SOLN: 1.0000 mg | INTRAVENOUS | Status: DC | PRN

## 2020-05-22 MED ORDER — KCL IN DEXTROSE-NACL 20-5-0.45 MEQ/L-%-% IV SOLN
INTRAVENOUS | Status: DC
  Filled 2020-05-22: qty 1000

## 2020-05-22 MED ORDER — CHLORHEXIDINE GLUCONATE 4 % EX LIQD: 60.0000 mL | Freq: Once | CUTANEOUS | Status: DC

## 2020-05-22 MED ORDER — EPHEDRINE SULFATE-NACL 50-0.9 MG/10ML-% IV SOSY
PREFILLED_SYRINGE | INTRAVENOUS | Status: DC | PRN
  Administered 2020-05-22: 5 mg via INTRAVENOUS
  Administered 2020-05-22: 10 mg via INTRAVENOUS
  Administered 2020-05-22: 5 mg via INTRAVENOUS

## 2020-05-22 MED ORDER — FENTANYL CITRATE (PF) 100 MCG/2ML IJ SOLN
INTRAMUSCULAR | Status: AC
  Filled 2020-05-22: qty 2

## 2020-05-22 MED ORDER — BUPIVACAINE HCL (PF) 0.25 % IJ SOLN
INTRAMUSCULAR | Status: DC | PRN
  Administered 2020-05-22: 20 mL

## 2020-05-22 MED ORDER — FENTANYL CITRATE (PF) 100 MCG/2ML IJ SOLN
50.0000 ug | Freq: Once | INTRAMUSCULAR | Status: AC
  Filled 2020-05-22: qty 2

## 2020-05-22 MED ORDER — ROCURONIUM BROMIDE 10 MG/ML (PF) SYRINGE
PREFILLED_SYRINGE | INTRAVENOUS | Status: DC | PRN
  Administered 2020-05-22: 20 mg via INTRAVENOUS
  Administered 2020-05-22: 50 mg via INTRAVENOUS

## 2020-05-22 MED ORDER — PROPOFOL 10 MG/ML IV BOLUS
INTRAVENOUS | Status: DC | PRN
  Administered 2020-05-22: 125 ug/kg/min via INTRAVENOUS
  Administered 2020-05-22: 150 mg via INTRAVENOUS

## 2020-05-22 MED ORDER — LAMOTRIGINE 100 MG PO TABS
200.0000 mg | ORAL_TABLET | Freq: Two times a day (BID) | ORAL | Status: DC
  Administered 2020-05-22: 200 mg via ORAL
  Filled 2020-05-22: qty 2

## 2020-05-22 MED ORDER — ONDANSETRON 4 MG PO TBDP: 4.0000 mg | ORAL_TABLET | Freq: Four times a day (QID) | ORAL | Status: DC | PRN

## 2020-05-22 MED ORDER — EPHEDRINE 5 MG/ML INJ
INTRAVENOUS | Status: AC
  Filled 2020-05-22: qty 10

## 2020-05-22 MED ORDER — CEFAZOLIN SODIUM-DEXTROSE 2-4 GM/100ML-% IV SOLN
2.0000 g | INTRAVENOUS | Status: AC
  Administered 2020-05-22: 2 g via INTRAVENOUS

## 2020-05-22 MED ORDER — TRAMADOL HCL 50 MG PO TABS: 50.0000 mg | ORAL_TABLET | Freq: Four times a day (QID) | ORAL | Status: DC | PRN

## 2020-05-22 MED ORDER — TAMSULOSIN HCL 0.4 MG PO CAPS: 0.4000 mg | ORAL_CAPSULE | Freq: Every day | ORAL | Status: DC

## 2020-05-22 MED ORDER — MIDAZOLAM HCL 2 MG/2ML IJ SOLN
1.0000 mg | INTRAMUSCULAR | Status: DC
  Filled 2020-05-22: qty 2

## 2020-05-22 MED ORDER — PROPOFOL 1000 MG/100ML IV EMUL
INTRAVENOUS | Status: AC
  Filled 2020-05-22: qty 100

## 2020-05-22 MED ORDER — ONDANSETRON HCL 4 MG/2ML IJ SOLN
INTRAMUSCULAR | Status: AC
  Filled 2020-05-22: qty 2

## 2020-05-22 MED ORDER — MIDAZOLAM HCL 2 MG/2ML IJ SOLN
INTRAMUSCULAR | Status: AC
  Filled 2020-05-22: qty 2

## 2020-05-22 MED ORDER — ONDANSETRON HCL 4 MG/2ML IJ SOLN
INTRAMUSCULAR | Status: DC | PRN
  Administered 2020-05-22: 4 mg via INTRAVENOUS

## 2020-05-22 MED ORDER — PROPOFOL 10 MG/ML IV BOLUS
INTRAVENOUS | Status: AC
  Filled 2020-05-22: qty 20

## 2020-05-22 MED ORDER — 0.9 % SODIUM CHLORIDE (POUR BTL) OPTIME
TOPICAL | Status: DC | PRN
  Administered 2020-05-22: 1000 mL

## 2020-05-22 MED ORDER — ACETAMINOPHEN 500 MG PO TABS
ORAL_TABLET | ORAL | Status: AC
  Administered 2020-05-22: 1000 mg via ORAL
  Filled 2020-05-22: qty 2

## 2020-05-22 MED ORDER — DEXAMETHASONE SODIUM PHOSPHATE 10 MG/ML IJ SOLN
INTRAMUSCULAR | Status: AC
  Filled 2020-05-22: qty 1

## 2020-05-22 MED ORDER — BUPIVACAINE LIPOSOME 1.3 % IJ SUSP
20.0000 mL | Freq: Once | INTRAMUSCULAR | Status: AC
  Administered 2020-05-22: 20 mL
  Filled 2020-05-22: qty 20

## 2020-05-22 MED ORDER — CEFAZOLIN SODIUM-DEXTROSE 2-4 GM/100ML-% IV SOLN
INTRAVENOUS | Status: AC
  Filled 2020-05-22: qty 100

## 2020-05-22 MED ORDER — ONDANSETRON HCL 4 MG/2ML IJ SOLN: 4.0000 mg | Freq: Four times a day (QID) | INTRAMUSCULAR | Status: DC | PRN

## 2020-05-22 MED ORDER — ENOXAPARIN SODIUM 40 MG/0.4ML ~~LOC~~ SOLN: 40.0000 mg | SUBCUTANEOUS | Status: DC

## 2020-05-22 MED ORDER — ACETAMINOPHEN 500 MG PO TABS: 1000.0000 mg | ORAL_TABLET | ORAL | Status: AC

## 2020-05-22 MED ORDER — LACTATED RINGERS IV SOLN: INTRAVENOUS | Status: DC

## 2020-05-22 MED ORDER — DAPAGLIFLOZIN PROPANEDIOL 5 MG PO TABS
5.0000 mg | ORAL_TABLET | Freq: Every day | ORAL | Status: DC
  Filled 2020-05-22: qty 1

## 2020-05-22 MED ORDER — ONDANSETRON HCL 4 MG/2ML IJ SOLN: 4.0000 mg | Freq: Once | INTRAMUSCULAR | Status: DC | PRN

## 2020-05-22 MED ORDER — GABAPENTIN 300 MG PO CAPS
300.0000 mg | ORAL_CAPSULE | Freq: Every evening | ORAL | Status: DC | PRN
  Filled 2020-05-22: qty 2

## 2020-05-22 SURGICAL SUPPLY — 43 items
ADH SKN CLS APL DERMABOND .7 (GAUZE/BANDAGES/DRESSINGS) ×1
APL SKNCLS STERI-STRIP NONHPOA (GAUZE/BANDAGES/DRESSINGS) ×1
BENZOIN TINCTURE PRP APPL 2/3 (GAUZE/BANDAGES/DRESSINGS) ×3 IMPLANT
BLADE SURG SZ10 CARB STEEL (BLADE) ×3 IMPLANT
CLOSURE WOUND 1/2 X4 (GAUZE/BANDAGES/DRESSINGS)
COVER SURGICAL LIGHT HANDLE (MISCELLANEOUS) ×3 IMPLANT
COVER WAND RF STERILE (DRAPES) IMPLANT
DECANTER SPIKE VIAL GLASS SM (MISCELLANEOUS) ×3 IMPLANT
DERMABOND ADVANCED (GAUZE/BANDAGES/DRESSINGS) ×2
DERMABOND ADVANCED .7 DNX12 (GAUZE/BANDAGES/DRESSINGS) IMPLANT
DISSECTOR ROUND CHERRY 3/8 STR (MISCELLANEOUS) ×2 IMPLANT
DRAIN PENROSE 0.5X18 (DRAIN) ×3 IMPLANT
DRAPE LAPAROTOMY TRNSV 102X78 (DRAPES) ×3 IMPLANT
ELECT REM PT RETURN 15FT ADLT (MISCELLANEOUS) ×3 IMPLANT
GAUZE SPONGE 4X4 12PLY STRL (GAUZE/BANDAGES/DRESSINGS) ×3 IMPLANT
GLOVE BIOGEL PI IND STRL 7.0 (GLOVE) ×1 IMPLANT
GLOVE BIOGEL PI INDICATOR 7.0 (GLOVE) ×2
GLOVE SURG SYN 7.5  E (GLOVE) ×3
GLOVE SURG SYN 7.5 E (GLOVE) ×1 IMPLANT
GLOVE SURG SYN 7.5 PF PI (GLOVE) ×1 IMPLANT
GOWN SPEC L4 XLG W/TWL (GOWN DISPOSABLE) ×3 IMPLANT
GOWN STRL REUS W/TWL LRG LVL3 (GOWN DISPOSABLE) ×3 IMPLANT
KIT BASIN OR (CUSTOM PROCEDURE TRAY) ×3 IMPLANT
KIT TURNOVER KIT A (KITS) IMPLANT
MESH ULTRAPRO 3X6 7.6X15CM (Mesh General) ×2 IMPLANT
NDL HYPO 25X1 1.5 SAFETY (NEEDLE) ×1 IMPLANT
NEEDLE HYPO 25X1 1.5 SAFETY (NEEDLE) ×3 IMPLANT
NS IRRIG 1000ML POUR BTL (IV SOLUTION) ×3 IMPLANT
PACK BASIC VI WITH GOWN DISP (CUSTOM PROCEDURE TRAY) ×3 IMPLANT
PENCIL SMOKE EVACUATOR (MISCELLANEOUS) IMPLANT
SPONGE LAP 18X18 RF (DISPOSABLE) ×3 IMPLANT
SPONGE LAP 4X18 RFD (DISPOSABLE) IMPLANT
STRIP CLOSURE SKIN 1/2X4 (GAUZE/BANDAGES/DRESSINGS) IMPLANT
SUT CHROMIC 0 SH (SUTURE) IMPLANT
SUT MNCRL AB 4-0 PS2 18 (SUTURE) IMPLANT
SUT NOVA 0 T19/GS 22DT (SUTURE) ×10 IMPLANT
SUT VIC AB 2-0 SH 27 (SUTURE) ×3
SUT VIC AB 2-0 SH 27X BRD (SUTURE) ×1 IMPLANT
SUT VIC AB 3-0 SH 18 (SUTURE) ×3 IMPLANT
SYR BULB IRRIG 60ML STRL (SYRINGE) ×3 IMPLANT
SYR CONTROL 10ML LL (SYRINGE) ×3 IMPLANT
TOWEL OR 17X26 10 PK STRL BLUE (TOWEL DISPOSABLE) ×3 IMPLANT
YANKAUER SUCT BULB TIP 10FT TU (MISCELLANEOUS) ×3 IMPLANT

## 2020-05-22 NOTE — Interval H&P Note (Signed)
History and Physical Interval Note:  05/22/2020 11:23 AM  Kenneth Ortega  has presented today for surgery, with the diagnosis of right ingunial hernia.  The various methods of treatment have been discussed with the patient and family. His wife is with him.  He took his AM Lamictal.  After consideration of risks, benefits and other options for treatment, the patient has consented to  Procedure(s): OPEN RIGHT INGUINAL HERNIA REPAIR (Right) as a surgical intervention.  The patient's history has been reviewed, patient examined, no change in status, stable for surgery.  I have reviewed the patient's chart and labs.  Questions were answered to the patient's satisfaction.     Shann Medal

## 2020-05-22 NOTE — Anesthesia Postprocedure Evaluation (Signed)
Anesthesia Post Note  Patient: Kenneth Ortega  Procedure(s) Performed: OPEN RIGHT INGUINAL HERNIA REPAIR (Right )     Patient location during evaluation: PACU Anesthesia Type: General Level of consciousness: awake and alert, awake and oriented Pain management: pain level controlled Vital Signs Assessment: post-procedure vital signs reviewed and stable Respiratory status: spontaneous breathing, nonlabored ventilation, respiratory function stable and patient connected to nasal cannula oxygen Cardiovascular status: blood pressure returned to baseline and stable Postop Assessment: no apparent nausea or vomiting Anesthetic complications: no   No complications documented.  Last Vitals:  Vitals:   05/22/20 1400 05/22/20 1415  BP: (!) 140/75 (!) 139/74  Pulse: 64 66  Resp: (!) 11 (!) 11  Temp:    SpO2: 95% 97%    Last Pain:  Vitals:   05/22/20 1415  TempSrc:   PainSc: 0-No pain                 Catalina Gravel

## 2020-05-22 NOTE — Op Note (Signed)
05/22/2020  5:48 PM  PATIENT:  Kenneth Ortega, 69 y.o., male, MRN: [id number]  PREOP DIAGNOSIS:  right ingunial hernia  POSTOP DIAGNOSIS:   Indirect right inguinal hernia  PROCEDURE:   Procedure(s):  OPEN RIGHT INGUINAL HERNIA REPAIR  SURGEON:   Alphonsa Overall, M.D.  ANESTHESIA:   general  Anesthesiologist: Catalina Gravel, MD CRNA: Victoriano Lain, CRNA; Teheng, Ryan E, CRNA  General  EBL:  minimal  ml  LOCAL MEDICATIONS USED:   20 cc of Exparel and 20 cc of 1/4% marcaine  SPECIMEN:   None  COUNTS CORRECT:  YES  INDICATIONS FOR PROCEDURE:  Kenneth Ortega is a 69 y.o. (DOB: 03-30-1951) white male whose primary care physician is [name], MD and comes for open repair of right inguinal hernia.   The indications and risks of the hernia surgery were explained to the patient.  The risks include, but are not limited to, infection, bleeding, recurrence of the hernia, and nerve injury.  Operative Note: The patient was taken to room number 1 at [facility].  He underwent a general anesthesia.    A time out was held and the surgical checklist run.  His lower abdomen was shaved and then prepped with chloroprep.  A right inguinal incision was made through the subcutaneous fat to the external oblique fascia.  The external ring was opened and the cord structures encircled with a penrose drain.  The patient had an indirect right inguinal hernia.  I identified the ileo inguinal nerve and preserved this during the dissection.  The patient had a hernia sac the came along the anterior medial portion of the cord structures.  The sac was dissected free of the cord structures and imbricated.  He also had a 3 x 5 cm lipoma of the cord.  I think that was the lipoma that I felt on exam.  The lipoma and indirect sac lay anterior and together at the internal ring.  He also had a weak inguinal floor that could be considered an early direct inguinal hernia.  The inguinal floor was repaired  with a 3 x 6 inch piece of Ultrapro mesh.  The mesh was cut to fit the inguinal floor.  The mesh was sewn in place with interrupted 0 Novafil suture.  I sewed the mesh medially to the pubic tubercle, inferiorly to the shelving edge of the inguinal ligament, and superiorly to the transversalis fascia.   A key hole was made for the internal ring.  The mesh lay flat.  The inguinal floor was covered and the internal ring recreated.  The cord structures were returned to a normal location.  The external oblique was closed with a 3-0 vicryl.  The fascia and subcutaneous tissues were infiltrated with 40 cc of a mixture of Exparel and  1/4% marcaine plain.  The skin was closed with 4-0 monocryl and painted with DermaBond. The sponge and needle count were correct at the end of the case.  The patient was transported to the recovery room in good condition.  Because of the patient's history of seizures, I plan to keep him overnight for observation.Alphonsa Overall, MD, [facility] Surgery Pager: [phone] Office phone:  [phone]

## 2020-05-22 NOTE — Anesthesia Procedure Notes (Signed)
Procedure Name: Intubation Date/Time: 05/22/2020 11:54 AM Performed by: Victoriano Lain, CRNA Pre-anesthesia Checklist: Patient identified, Emergency Drugs available, Suction available, Patient being monitored and Timeout performed Patient Re-evaluated:Patient Re-evaluated prior to induction Oxygen Delivery Method: Circle system utilized Preoxygenation: Pre-oxygenation with 100% oxygen Induction Type: IV induction Ventilation: Mask ventilation without difficulty Laryngoscope Size: Mac and 4 Grade View: Grade I Tube type: Oral Tube size: 7.5 mm Number of attempts: 1 Airway Equipment and Method: Stylet Placement Confirmation: ETT inserted through vocal cords under direct vision,  positive ETCO2 and breath sounds checked- equal and bilateral Secured at: 21 cm Tube secured with: Tape Dental Injury: Teeth and Oropharynx as per pre-operative assessment

## 2020-05-22 NOTE — Transfer of Care (Signed)
Immediate Anesthesia Transfer of Care Note  Patient: Kenneth Ortega  Procedure(s) Performed: OPEN RIGHT INGUINAL HERNIA REPAIR (Right )  Patient Location: PACU  Anesthesia Type:General  Level of Consciousness: awake, alert , oriented and patient cooperative  Airway & Oxygen Therapy: Patient Spontanous Breathing and Patient connected to face mask oxygen  Post-op Assessment: Report given to RN, Post -op Vital signs reviewed and stable and Patient moving all extremities  Post vital signs: Reviewed and stable  Last Vitals:  Vitals Value Taken Time  BP 137/84 05/22/20 1345  Temp    Pulse 76 05/22/20 1346  Resp 15 05/22/20 1346  SpO2 95 % 05/22/20 1346  Vitals shown include unvalidated device data.  Last Pain:  Vitals:   05/22/20 0700  TempSrc: Oral         Complications: No complications documented.

## 2020-05-23 ENCOUNTER — Encounter (HOSPITAL_COMMUNITY): Payer: Self-pay | Admitting: Surgery

## 2020-05-23 DIAGNOSIS — Z79899 Other long term (current) drug therapy: Secondary | ICD-10-CM | POA: Diagnosis not present

## 2020-05-23 DIAGNOSIS — Z85528 Personal history of other malignant neoplasm of kidney: Secondary | ICD-10-CM | POA: Diagnosis not present

## 2020-05-23 DIAGNOSIS — Z8669 Personal history of other diseases of the nervous system and sense organs: Secondary | ICD-10-CM | POA: Diagnosis not present

## 2020-05-23 DIAGNOSIS — K4090 Unilateral inguinal hernia, without obstruction or gangrene, not specified as recurrent: Secondary | ICD-10-CM | POA: Diagnosis not present

## 2020-05-23 DIAGNOSIS — G4733 Obstructive sleep apnea (adult) (pediatric): Secondary | ICD-10-CM | POA: Diagnosis not present

## 2020-05-23 DIAGNOSIS — I10 Essential (primary) hypertension: Secondary | ICD-10-CM | POA: Diagnosis not present

## 2020-05-23 MED ORDER — HYDROCODONE-ACETAMINOPHEN 5-325 MG PO TABS: 1.0000 | ORAL_TABLET | Freq: Four times a day (QID) | ORAL | 0 refills | Status: DC | PRN

## 2020-05-23 NOTE — Plan of Care (Signed)
Plan of care 

## 2020-05-23 NOTE — Discharge Instructions (Signed)
[facility] - DISCHARGE INSTRUCTIONS TO PATIENT  Activity:  Driving - May drive in 2 to 4 days, if doing well.   Lifting - No lifting more than 15 pounds for 4 weeks                       Practice your Covid-19 protection:  Wear a mask, social distance, and wash your hands frequently  Wound Care:   Leave the incision dry until tomorrow, then you may shower  Diet:  As tolerated  Follow up appointment:  Call Dr. Pollie Friar office [facility]) at [phone] for an appointment in 2 to 3 weeks..  Medications and dosages:  Resume your home medications.  You have a prescription for:  Vicodon             You may also take Tylenol, ibuprofen, or Aleve for pain  Call Dr. Lucia Gaskins or his office  [phone]) if you have:  Temperature greater than 100.4,  Persistent nausea and vomiting,  Severe uncontrolled pain,  Redness, tenderness, or signs of infection (pain, swelling, redness, odor or green/yellow discharge around the site),  Difficulty breathing, headache or visual disturbances,  Any other questions or concerns you may have after discharge.  In an emergency, call 911 or go to an Emergency Department at a nearby hospital.

## 2020-05-23 NOTE — Discharge Summary (Signed)
Physician Discharge Summary  Patient ID:  Kenneth Ortega  MRN: [id number]  DOB/AGE: 1951-09-17 69 y.o.  Admit date: 05/22/2020 Discharge date: 05/23/2020  Discharge Diagnoses:  1.  INGUINAL HERNIA OF RIGHT SIDE (indirect) WITHOUT OBSTRUCTION OR GANGRENE  2.  History of seizures 3. OSA On CPAP 4. HTN x 10 years 5. Renal cell ca of the left kidney Left nephrectomy - 2007 - [facility] 6. BPH - he was on Flomax, but has recently stopped this 7. History of melanoma of the back - taken care of at [facility] about 3 or 4 years ago   Active Problems:   Right inguinal hernia  Operation: Procedure(s):  OPEN RIGHT INGUINAL HERNIA REPAIR on 05/22/2020 - [facility]  Discharged Condition: good  Hospital Course: Kenneth Ortega is an 69 y.o. male whose primary care physician is [name], MD and who was admitted 05/22/2020 with a chief complaint of No chief complaint on file. Marland Kitchen   He was brought to the operating room on 05/22/2020 and underwent an open right inguinal hernia.   He was kept overnight because of his history of seizures.  He had a good night.  His pain is well controlled.  He is ready to go home.  The discharge instructions were reviewed with the patient.  Consults: None  Significant Diagnostic Studies: Results for orders placed or performed during the hospital encounter of 05/22/20  Glucose, capillary  Result Value Ref Range   Glucose-Capillary 131 (H) 70 - 99 mg/dL  Glucose, capillary  Result Value Ref Range   Glucose-Capillary 125 (H) 70 - 99 mg/dL    No results found.  Discharge Exam:  Vitals:   05/23/20 0125 05/23/20 0557  BP: 124/71 (!) 138/82  Pulse: 58 58  Resp: 16 16  Temp: 97.7 F (36.5 C) 97.6 F (36.4 C)  SpO2: 98% 97%    General: WN WM who is alert and generally healthy appearing.  Lungs: Clear to auscultation and symmetric breath sounds. Heart:  RRR. No murmur or rub. Abdomen: Soft.Incision looks good.  Discharge Medications:   Allergies as  of 05/23/2020      Reactions   Tylox [oxycodone-acetaminophen] Other (See Comments)   Hallucinations/insomnia . Does not tolerate any of the "oxys". Prefers hydrocodone   Keppra [levetiracetam] Other (See Comments)   Patient unsure of reaction   Oxycontin [oxycodone] Other (See Comments)   hallucinations       Medication List    TAKE these medications   Benicar 5 MG tablet Generic drug: olmesartan Take 5 mg by mouth at bedtime.   Farxiga 5 MG Tabs tablet Generic drug: dapagliflozin propanediol Take 5 mg by mouth daily.   FLUoxetine 20 MG capsule Commonly known as: PROZAC Take 20 mg by mouth daily.   gabapentin 300 MG capsule Commonly known as: NEURONTIN TAKE 2 CAPSULES (600 MG TOTAL) BY MOUTH AT BEDTIME. What changed:   how much to take  when to take this  reasons to take this   HYDROcodone-acetaminophen 5-325 MG tablet Commonly known as: NORCO/VICODIN Take 1 tablet by mouth every 6 (six) hours as needed for moderate pain.   lamoTRIgine 200 MG tablet Commonly known as: LAMICTAL Take 1 tablet (200 mg total) by mouth 2 (two) times daily.   simvastatin 20 MG tablet Commonly known as: ZOCOR Take 20 mg by mouth daily.   simvastatin 10 MG tablet Commonly known as: ZOCOR Take 10 mg by mouth at bedtime.   tamsulosin 0.4 MG Caps capsule Commonly known as:  FLOMAX Take 0.4 mg by mouth daily.       Disposition: Discharge disposition: 01-Home or Self Care       Discharge Instructions    Diet - low sodium heart healthy   Complete by: As directed    Increase activity slowly   Complete by: As directed       Signed: Alphonsa Overall, M.D., [facility] Surgery Office:  [phone]  05/23/2020, 7:26 AM

## 2020-06-16 ENCOUNTER — Other Ambulatory Visit: Payer: Self-pay

## 2020-06-16 MED ORDER — GABAPENTIN 300 MG PO CAPS: 600.0000 mg | ORAL_CAPSULE | Freq: Every day | ORAL | 1 refills | Status: DC

## 2020-06-16 NOTE — Addendum Note (Signed)
Addended by: Lester Goodhue A on: 06/16/2020 03:32 PM   Modules accepted: Orders

## 2020-06-16 NOTE — Telephone Encounter (Signed)
Pt left a VM asking for a refill on gabapentin to be sent to [facility] in [place]. He is asking for a call back regarding this.

## 2020-06-16 NOTE — Telephone Encounter (Signed)
I called pt. He needs a refill on gabapentin 600mg  QHS PRN. A follow up appt was made for 12/22/2020 at 9:15am with Judson Roch, NP. Pt verbalized understanding of new appt date and time.

## 2020-10-01 DIAGNOSIS — N183 Chronic kidney disease, stage 3 unspecified: Secondary | ICD-10-CM | POA: Diagnosis not present

## 2020-10-01 DIAGNOSIS — Z23 Encounter for immunization: Secondary | ICD-10-CM | POA: Diagnosis not present

## 2020-10-02 DIAGNOSIS — R972 Elevated prostate specific antigen [PSA]: Secondary | ICD-10-CM | POA: Diagnosis not present

## 2020-10-02 DIAGNOSIS — N401 Enlarged prostate with lower urinary tract symptoms: Secondary | ICD-10-CM | POA: Diagnosis not present

## 2020-10-02 DIAGNOSIS — R351 Nocturia: Secondary | ICD-10-CM | POA: Diagnosis not present

## 2020-10-15 ENCOUNTER — Encounter: Payer: Self-pay | Admitting: Dermatology

## 2020-10-15 ENCOUNTER — Other Ambulatory Visit: Payer: Self-pay

## 2020-10-15 ENCOUNTER — Ambulatory Visit (INDEPENDENT_AMBULATORY_CARE_PROVIDER_SITE_OTHER): Payer: Medicare Other | Admitting: Dermatology

## 2020-10-15 DIAGNOSIS — Z1283 Encounter for screening for malignant neoplasm of skin: Secondary | ICD-10-CM

## 2020-10-15 DIAGNOSIS — Z87898 Personal history of other specified conditions: Secondary | ICD-10-CM

## 2020-10-15 DIAGNOSIS — Z85820 Personal history of malignant melanoma of skin: Secondary | ICD-10-CM

## 2020-10-15 DIAGNOSIS — Z86018 Personal history of other benign neoplasm: Secondary | ICD-10-CM

## 2020-10-15 DIAGNOSIS — Z85828 Personal history of other malignant neoplasm of skin: Secondary | ICD-10-CM

## 2020-10-18 ENCOUNTER — Encounter: Payer: Self-pay | Admitting: Dermatology

## 2020-10-18 NOTE — Addendum Note (Signed)
Addended by: Lavonna Monarch on: 10/18/2020 10:08 AM   Modules accepted: Level of Service

## 2020-10-18 NOTE — Progress Notes (Signed)
   Follow-Up Visit   Subjective  Kenneth Ortega is a 69 y.o. male who presents for the following: Annual Exam (No new concerns).  General skin check Location:  Duration:  Quality:  Associated Signs/Symptoms: Modifying Factors:  Severity:  Timing: Context: Melanoma upper back 2007  Objective  Well appearing patient in no apparent distress; mood and affect are within normal limits. Objective  Mid Back: 4 mm gray-brown macule above left flank shows dermoscopic atypia.  Poor timing for patient to obtain biopsy.   BX = DEFER DUE TO TRAVEL HEAD TO TOE EXAM   Images    Objective  Chest - Medial [facility]): No recurrence  Objective  Right Upper Back: No recurrent pigmentation, no regional adenopathy  RIGHT UPPER BACK 2013  Objective  Head - Anterior (Face): BCC RIGHT BACK BCC LEFT FOREHEAD=MOHS No sign recurrence.   A full examination was performed including scalp, head, eyes, ears, nose, lips, neck, chest, axillae, abdomen, back, buttocks, bilateral upper extremities, bilateral lower extremities, hands, feet, fingers, toes, fingernails, and toenails. All findings within normal limits unless otherwise noted below.   Assessment & Plan    Screening exam for skin cancer Mid Back  Schedule return visit for biopsy.  History of atypical nevus Chest - Medial [facility])  Annual skin check and Darrel encouraged to self examine his skin twice annually  Personal history of malignant melanoma of skin Right Upper Back  Annual skin examination.  History of basal cell cancer Head - Anterior (Face)  Annual skin examination.     I, Lavonna Monarch, MD, have reviewed all documentation for this visit.  The documentation on 10/18/20 for the exam, diagnosis, procedures, and orders are all accurate and complete.

## 2020-11-06 ENCOUNTER — Ambulatory Visit (INDEPENDENT_AMBULATORY_CARE_PROVIDER_SITE_OTHER): Payer: Medicare Other | Admitting: Neurology

## 2020-11-06 ENCOUNTER — Encounter: Payer: Self-pay | Admitting: Neurology

## 2020-11-06 VITALS — BP 140/72 | HR 68 | Ht 70.5 in | Wt 197.0 lb

## 2020-11-06 DIAGNOSIS — G4733 Obstructive sleep apnea (adult) (pediatric): Secondary | ICD-10-CM | POA: Diagnosis not present

## 2020-11-06 DIAGNOSIS — G40909 Epilepsy, unspecified, not intractable, without status epilepticus: Secondary | ICD-10-CM | POA: Diagnosis not present

## 2020-11-06 DIAGNOSIS — Z9989 Dependence on other enabling machines and devices: Secondary | ICD-10-CM

## 2020-11-06 DIAGNOSIS — G4730 Sleep apnea, unspecified: Secondary | ICD-10-CM | POA: Diagnosis not present

## 2020-11-06 DIAGNOSIS — F5105 Insomnia due to other mental disorder: Secondary | ICD-10-CM

## 2020-11-06 DIAGNOSIS — F32A Depression, unspecified: Secondary | ICD-10-CM

## 2020-11-06 DIAGNOSIS — G4700 Insomnia, unspecified: Secondary | ICD-10-CM

## 2020-11-06 DIAGNOSIS — I951 Orthostatic hypotension: Secondary | ICD-10-CM

## 2020-11-06 MED ORDER — LAMOTRIGINE 200 MG PO TABS: 200.0000 mg | ORAL_TABLET | Freq: Two times a day (BID) | ORAL | 4 refills | Status: DC

## 2020-11-06 MED ORDER — GABAPENTIN 300 MG PO CAPS: 600.0000 mg | ORAL_CAPSULE | Freq: Every day | ORAL | 3 refills | Status: DC

## 2020-11-06 NOTE — Progress Notes (Signed)
Geriatric Depression Scale: Short Form Score was 3  Epworth Sleepiness Scale 0= would never doze 1= slight chance of dozing 2= moderate chance of dozing 3= high chance of dozing  Sitting and reading: 2 Watching TV: 1 Sitting inactive in a public place (ex. Theater or meeting): 1 As a passenger in a car for an hour without a break: 2 Lying down to rest in the afternoon: 3 Sitting and talking to someone: 1 Sitting quietly after lunch (no alcohol): 2 In a car, while stopped in traffic: 1 Total: 13  FSS was 44

## 2020-11-06 NOTE — Progress Notes (Signed)
SLEEP MEDICINE CLINIC    Provider:  Melvyn Novas, MD  Primary Care Physician:  Pearson Grippe, MD [address] [place] [place] [address]     Referring Provider: Pearson Grippe, Md [address] [place],  [place] [address]          Chief Complaint according to patient   Patient presents with:    . New Patient (Initial Visit)           HISTORY OF PRESENT ILLNESS:  Kenneth Ortega is a 70 - year- old  Caucasian male patient seen here after his wife requested a transfer of care to me on 11/06/2020 . He was established with Dr Anne Hahn, who is now part-time. He has developed a more severe insomnia problem over the last 12 month . He is often able to go to sleep, but wakes up after only 3 hours of sleep, feeling alert- but then suffers through the day feeling unrested , unrefreshed.   Chief concern according to patient : I have had nasal congestion over 50 years- and have seizures all day- absence spells. He wonders if his seizures are what keeps him from sleeping. Dr Anne Hahn had done several EEG s with absence documented. heis first manifestation of a seizure followed insomnia for 3 days- the sleep deprivation put him over the edge.  There was a time he was dependent on AMBIEN and he has weaned off started Prozac, which helped to sleep, too.     I have the pleasure of seeing Kenneth Ortega today, a right-handed White or Caucasian male with OSA and insomnia, nasal deviated septum. He uses nasal mist every night-  He  has a past medical history of Atypical mole (10/29/2009), Atypical mole (07/22/2015), Atypical mole (02/16/2017), Basal cell carcinoma (04/30/2009), Basal cell carcinoma (10/20/2016), Basal cell carcinoma (11/18/2016), Cancer (HCC), Chronic renal insufficiency, Complication of anesthesia, Depression, Diabetes mellitus without complication (HCC), Dyslipidemia, Headache(784.0), History of kidney stones, Hypertension, Melanoma (HCC) (03/14/2012), Memory  disturbance, Obstructive sleep apnea on CPAP, Prostatitis, Renal cell carcinoma of left kidney (HCC), monokidney- last GFR 09-2020 [facility] was 52/h,  and absence Seizure (HCC).   The patient had the first sleep study in the year 4/ 2017, with a result of an AHI ( Apnea Hypopnea index)  of 21/h. He did not come back for an ordered CPAP titration in 2020.  Kenneth Ortega has been very compliant use of his CPAP under percent and times a day for 9 hours and 48 minutes on average use of CPAP is set to 10 cm 12 cm of water was 3 cm expiratory pressure relief his residual AHI is 2.8 which is excellent he does have a moderate to high air leak at the 95th percentile of 28.6 L/min.  It does not seem to bother him that much.  His mask is in nasal mask he is using a [employer] ESON, which is a nasal mask.    12/03/19  Kenneth Ortega is a 71 year old male who presents for follow-up for seizures and obstructive sleep apnea with CPAP use.  His download indicates excellent compliance use 30/30 days, 97% greater than 4 hours use.  Average total usage was 8 hours 58 minutes.  His minimum pressure is set at 6, max at 12.  His leak in the 95th percentile was 22.2, AHI was 6.1.  He reports that he feels sleepy all the time, he feels he does sleep during the night, but the next  day he has fatigue. He feels his mask fits well, doesn't notice any leak. He is dealing with a left shoulder injury, has not been able to exercise as he would like.  His blood sugar was recently high, his primary doctor started him on Farxiga.  He reports otherwise, blood work was normal, in regards to fatigue.  He is on Lamictal 200 mg twice a day.  He continues to report daily episodes of seizures that are brief, described as impairing his concentration,  And altered speech.  He says nobody will even notice it.  They do not interfere with his ability to function.  He has been having these episodes for years.  He presents today for follow-up  unaccompanied.  HISTORY just before COVID 19 hit.  11/30/2018 SS: Kenneth Ortega is a 70 year old male who presents for follow-up today.  He has history of seizures and obstructive sleep apnea with CPAP use.  His CPAP download download indicates excellent compliance.  He used his machine 30 out of 30 days.  His report shows 100% compliance using his machine for greater than 4 hours every night.  He had an average usage of 8 hours and 26 minutes.  His pressures shows 6/12. He had a leak of 11.8 and AHI 4.4.  He denies any problems with his equipment. He reports that he sleeps well at night. He is taking 200 mg of Lamictal twice a day.  He reports compliance with his medication.  He reports that he continues to have seizures every 3 to 4 days.  During his episodes he has brief moments of not being able to speak or he may stumble. These last for only a few seconds.  He reports that nobody would even notice it.  He returns back to his baseline.  These episodes do not interfere with his function.  He reports that he has had these episodes for years.  He denies any problems with his gait. He denies missing any doses of his medication.    Social history:  Patient is retired from [employer] and lives in a household with wife , since his dog died.  Family status is married without children.   Tobacco use; none .  ETOH use ; 3-5 drinks a week or less,  Caffeine intake in form of Coffee(in am 2 cups) . Regular exercise in form of walking.   Hobbies :meeting with friends.      Sleep habits are as follows: The patient's dinner time is between 4 PM. The patient goes to bed at 9 PM and continues to sleep for 4 hours, wakes for 1-2 bathroom breaks, the first time at 1.30 AM.   The preferred sleep position is suine and sideways, with the support of 1 pillow.  Dreams are reportedly frequent, "good dreams "  7 AM is the usual rise time. The patient wakes up spontaneously.  He reports not feeling refreshed  or restored in AM,- refreshed but not for long- after lunch he is sleepy again,  Wakes on CPAP, no morning headaches justresidual fatigue.  Naps are taken frequently, lasting from 30-60 minutes and are more refreshing than nocturnal sleep.    Review of Systems: Out of a complete 14 system review, the patient complains of only the following symptoms, and all other reviewed systems are negative.:  Fatigue, sleepiness ,no snoring on CPAP - or rare, fragmented sleep, Insomnia - sleep initiation and sustaining sleep.    How likely are you to doze in the following  situations: 0 = not likely, 1 = slight chance, 2 = moderate chance, 3 = high chance   Sitting and Reading? Watching Television? Sitting inactive in a public place (theater or meeting)? As a passenger in a car for an hour without a break? Lying down in the afternoon when circumstances permit? Sitting and talking to someone? Sitting quietly after lunch without alcohol? In a car, while stopped for a few minutes in traffic?   Total = 13/ 24 points   FSS endorsed at 35/ 63 points.  GDS 2/ 15 on prozac.   Social History   Socioeconomic History  . Marital status: Married    Spouse name: Olin Hauser  . Number of children: 0  . Years of education: HS  . Highest education level: Not on file  Occupational History  . Occupation: Retired    Fish farm manager: [employer]  Tobacco Use  . Smoking status: Never Smoker  . Smokeless tobacco: Never Used  Vaping Use  . Vaping Use: Never used  Substance and Sexual Activity  . Alcohol use: Yes    Alcohol/week: 0.0 - 1.0 standard drinks    Comment: OCC  . Drug use: No  . Sexual activity: Yes  Other Topics Concern  . Not on file  Social History Narrative   Patient lives at home with his wife Olin Hauser). Patient is retired. High school education.   Right handed.   Caffeine- one or two cups daily   Social Determinants of Health   Financial Resource Strain: Not on file  Food Insecurity: Not on  file  Transportation Needs: Not on file  Physical Activity: Not on file  Stress: Not on file  Social Connections: Not on file    Family History  Problem Relation Age of Onset  . Cancer Mother   . Heart Problems Mother   . Throat cancer Father   . Diabetes Sister     Past Medical History:  Diagnosis Date  . Atypical mole 10/29/2009   moderate-back  . Atypical mole 07/22/2015   moderate- left shoulder  . Atypical mole 02/16/2017   moderate-mid back  . Basal cell carcinoma 04/30/2009   right back- (CX35FU)  . Basal cell carcinoma 10/20/2016   nod-left forehead (exc)  . Basal cell carcinoma 11/18/2016   nod- left forehead (+margin) ([facility])  . Cancer ([place])    Skin, melanoma   back  and basal cell forhead  . Chronic renal insufficiency   . Complication of anesthesia    BP flucuates up and down  , dizziness   . Depression   . Diabetes mellitus without complication ([place])   . Dyslipidemia   . Headache(784.0)    Migraine as child  . History of kidney stones   . Hypertension   . Melanoma ([place]) 03/14/2012   level III- right upper back (dr Nicole Kindred)  . Memory disturbance   . Obstructive sleep apnea on CPAP   . Prostatitis   . Renal cell carcinoma of left kidney ([place])    removed 2007  . Rotator cuff tear    Left  . Rotator cuff tear    left shoulder   . Seizure ([place])    hx since 1996/7 ; per patient , has short bursts of seizures characterized with expressive aphasia, managed by neurologist Dr Jannifer Franklin    Past Surgical History:  Procedure Laterality Date  . Arthroscopic surgery     Left knee  . INCISIONAL HERNIA REPAIR     Left flank  . INGUINAL HERNIA REPAIR  Right 05/22/2020   Procedure: OPEN RIGHT INGUINAL HERNIA REPAIR;  Surgeon: Alphonsa Overall, MD;  Location: [facility];  Service: General;  Laterality: Right;  . KIDNEY SURGERY Left 2007   Removed due to cancer; per patient " my other kidney is functioning great right now"  . Pilonidal cyst resection    . SKIN CANCER  DESTRUCTION    . TOTAL KNEE ARTHROPLASTY Left 07/18/2017   Procedure: LEFT TOTAL KNEE ARTHROPLASTY;  Surgeon: Gaynelle Arabian, MD;  Location: [facility];  Service: Orthopedics;  Laterality: Left;     Current Outpatient Medications on File Prior to Visit  Medication Sig Dispense Refill  . BENICAR 5 MG tablet Take 5 mg by mouth at bedtime.     . dapagliflozin propanediol (FARXIGA) 5 MG TABS tablet Take 5 mg by mouth daily.     Marland Kitchen FLUoxetine (PROZAC) 20 MG capsule Take 20 mg by mouth daily.    Marland Kitchen gabapentin (NEURONTIN) 300 MG capsule Take 2 capsules (600 mg total) by mouth at bedtime. 60 capsule 1  . HYDROcodone-acetaminophen (NORCO/VICODIN) 5-325 MG tablet Take 1 tablet by mouth every 6 (six) hours as needed for moderate pain. 12 tablet 0  . lamoTRIgine (LAMICTAL) 200 MG tablet Take 1 tablet (200 mg total) by mouth 2 (two) times daily. 180 tablet 4  . simvastatin (ZOCOR) 10 MG tablet Take 10 mg by mouth at bedtime.     . simvastatin (ZOCOR) 20 MG tablet Take 20 mg by mouth daily.     . tamsulosin (FLOMAX) 0.4 MG CAPS capsule Take 0.4 mg by mouth daily.     No current facility-administered medications on file prior to visit.    Allergies  Allergen Reactions  . Tylox [Oxycodone-Acetaminophen] Other (See Comments)    Hallucinations/insomnia . Does not tolerate any of the "oxys". Prefers hydrocodone  . Keppra [Levetiracetam] Other (See Comments)    Patient unsure of reaction  . Oxycontin [Oxycodone] Other (See Comments)    hallucinations     Physical exam:  Today's Vitals   11/06/20 0900  BP: 140/72  Pulse: 68  SpO2: 98%  Weight: 197 lb (89.4 kg)  Height: 5' 10.5" (1.791 m)   Body mass index is 27.87 kg/m.   Wt Readings from Last 3 Encounters:  11/06/20 197 lb (89.4 kg)  05/22/20 196 lb 3.4 oz (89 kg)  05/14/20 196 lb (88.9 kg)     Ht Readings from Last 3 Encounters:  11/06/20 5' 10.5" (1.791 m)  05/22/20 5' 10.5" (1.791 m)  05/14/20 5' 10.5" (1.791 m)      General: The  patient is awake, alert and appears not in acute distress. The patient is well groomed. Head: Normocephalic, atraumatic. Neck is supple. Mallampati 2,  neck circumference:17 inches.  Nasal airflow barely patent.  Retrognathia is not seen.  Dental status: intact  Cardiovascular:  Regular rate and cardiac rhythm by pulse,  without distended neck veins. Respiratory: Lungs are clear to auscultation.  Skin:  Without evidence of ankle edema, or rash. Trunk: The patient's posture is erect.   Neurologic exam : The patient is awake and alert, oriented to place and time.   Memory subjective described as intact.  Attention span & concentration ability appears normal.  Speech is fluent,  without  dysarthria, dysphonia or aphasia.  Mood and affect are appropriate.   Cranial nerves: no loss of smell or taste reported  Pupils are equal and briskly reactive to light. Funduscopic exam deferred.   Extraocular movements in vertical and horizontal  planes were intact and without nystagmus. No Diplopia. Visual fields by finger perimetry are intact. Hearing was intact to soft voice and finger rubbing.    Facial sensation intact to fine touch.  Facial motor strength is symmetric and tongue and uvula move midline.  Neck ROM : rotation, tilt and flexion extension were normal for age and shoulder shrug was symmetrical.    Motor exam:  Symmetric bulk, tone and ROM.   Normal tone without cog wheeling, symmetric grip strength .   Sensory:  Fine touch, pinprick and vibration were tested and Vibration is decreased in both feet, toes.  Proprioception tested in the upper extremities was normal.   Coordination: Rapid alternating movements in the fingers/hands were of normal speed. No changes in penmanship. The Finger-to-nose maneuver was intact without evidence of ataxia, dysmetria or tremor.   Gait and station: Patient could rise unassisted from a seated position, walked without assistive device.  Stance is of  normal width/ base.  Toe and heel walk were deferred.  Deep tendon reflexes: in the  upper and lower extremities are symmetric and intact.  Babinski response was deferred .      After spending a total time of 45 minutes face to face and additional time for physical and neurologic examination, review of laboratory studies,  personal review of imaging studies, reports and results of other testing and review of referral information / records as far as provided in visit, I have established the following assessments:  1) patient with chronic, longstanding INSOMNIA and past addiction to sleep aids. Has been doing well on prozac- until last year. Feels not under stress, not worried , not anxious. Prozac works.  2) sleep fragmentation is an early morning arousal, not triggered by urge to urinate.  3) absence seizures are well controlled, but certainly can influence sleep pattern. 4) has known OSA and last titrated to CPAP in 4/ 2017 - Was done at [facility].  He is on his second machine and age is already 3 years this April. His machine has sputtered- it is not smoothly working anymore.    My Plan is to proceed with:  1) Seizure montage PSG - ordered at [facility] sleep. Will need to write for a new auto CPAP.   2) can take gabapentin and may use benadryl that night. He should really try mealtonin 3 mg or less.   3) he will continue all seizure meds.   I would like to thank [name], MD and [name], [place],  [place] [address] for allowing me to meet with and to take care of this pleasant patient.   In short, Kenneth Ortega is presenting with Insomnia, absence seizure,   I plan to follow up either personally or through our NP within 2-3 month.   CC: I will share my notes with Dr Jannifer Franklin , Butler Denmark, NP . Dr Maudie Mercury.   Electronically signed by: Larey Seat, MD 11/06/2020 9:16 AM  [facility] Board certified by The  [employer] of Sleep Medicine and Diplomate of the [employer] of Sleep Medicine. Board certified In Neurology through the [place], Fellow of the [employer] of Neurology. Medical Director of [facility].

## 2020-11-06 NOTE — Patient Instructions (Signed)
Insomnia Insomnia is a sleep disorder that makes it difficult to fall asleep or stay asleep. Insomnia can cause fatigue, low energy, difficulty concentrating, mood swings, and poor performance at work or school. There are three different ways to classify insomnia:  Difficulty falling asleep.  Difficulty staying asleep.  Waking up too early in the morning. Any type of insomnia can be long-term (chronic) or short-term (acute). Both are common. Short-term insomnia usually lasts for three months or less. Chronic insomnia occurs at least three times a week for longer than three months. What are the causes? Insomnia may be caused by another condition, situation, or substance, such as:  Anxiety.  Certain medicines.  Gastroesophageal reflux disease (GERD) or other gastrointestinal conditions.  Asthma or other breathing conditions.  Restless legs syndrome, sleep apnea, or other sleep disorders.  Chronic pain.  Menopause.  Stroke.  Abuse of alcohol, tobacco, or illegal drugs.  Mental health conditions, such as depression.  Caffeine.  Neurological disorders, such as Alzheimer's disease.  An overactive thyroid (hyperthyroidism). Sometimes, the cause of insomnia may not be known. What increases the risk? Risk factors for insomnia include:  Gender. Women are affected more often than men.  Age. Insomnia is more common as you get older.  Stress.  Lack of exercise.  Irregular work schedule or working night shifts.  Traveling between different time zones.  Certain medical and mental health conditions. What are the signs or symptoms? If you have insomnia, the main symptom is having trouble falling asleep or having trouble staying asleep. This may lead to other symptoms, such as:  Feeling fatigued or having low energy.  Feeling nervous about going to sleep.  Not feeling rested in the morning.  Having trouble concentrating.  Feeling irritable, anxious, or depressed. How  is this diagnosed? This condition may be diagnosed based on:  Your symptoms and medical history. Your health care provider may ask about: ? Your sleep habits. ? Any medical conditions you have. ? Your mental health.  A physical exam. How is this treated? Treatment for insomnia depends on the cause. Treatment may focus on treating an underlying condition that is causing insomnia. Treatment may also include:  Medicines to help you sleep.  Counseling or therapy.  Lifestyle adjustments to help you sleep better. Follow these instructions at home: Eating and drinking  Limit or avoid alcohol, caffeinated beverages, and cigarettes, especially close to bedtime. These can disrupt your sleep.  Do not eat a large meal or eat spicy foods right before bedtime. This can lead to digestive discomfort that can make it hard for you to sleep.   Sleep habits  Keep a sleep diary to help you and your health care provider figure out what could be causing your insomnia. Write down: ? When you sleep. ? When you wake up during the night. ? How well you sleep. ? How rested you feel the next day. ? Any side effects of medicines you are taking. ? What you eat and drink.  Make your bedroom a dark, comfortable place where it is easy to fall asleep. ? Put up shades or blackout curtains to block light from outside. ? Use a white noise machine to block noise. ? Keep the temperature cool.  Limit screen use before bedtime. This includes: ? Watching TV. ? Using your smartphone, tablet, or computer.  Stick to a routine that includes going to bed and waking up at the same times every day and night. This can help you fall asleep faster. Consider   making a quiet activity, such as reading, part of your nighttime routine.  Try to avoid taking naps during the day so that you sleep better at night.  Get out of bed if you are still awake after 15 minutes of trying to sleep. Keep the lights down, but try reading or  doing a quiet activity. When you feel sleepy, go back to bed.   General instructions  Take over-the-counter and prescription medicines only as told by your health care provider.  Exercise regularly, as told by your health care provider. Avoid exercise starting several hours before bedtime.  Use relaxation techniques to manage stress. Ask your health care provider to suggest some techniques that may work well for you. These may include: ? Breathing exercises. ? Routines to release muscle tension. ? Visualizing peaceful scenes.  Make sure that you drive carefully. Avoid driving if you feel very sleepy.  Keep all follow-up visits as told by your health care provider. This is important. Contact a health care provider if:  You are tired throughout the day.  You have trouble in your daily routine due to sleepiness.  You continue to have sleep problems, or your sleep problems get worse. Get help right away if:  You have serious thoughts about hurting yourself or someone else. If you ever feel like you may hurt yourself or others, or have thoughts about taking your own life, get help right away. You can go to your nearest emergency department or call:  Your local emergency services (911 in the [place]  A suicide crisis helpline, such as the National Suicide Prevention Lifeline at 1-800-273-8255. This is open 24 hours a day. Summary  Insomnia is a sleep disorder that makes it difficult to fall asleep or stay asleep.  Insomnia can be long-term (chronic) or short-term (acute).  Treatment for insomnia depends on the cause. Treatment may focus on treating an underlying condition that is causing insomnia.  Keep a sleep diary to help you and your health care provider figure out what could be causing your insomnia. This information is not intended to replace advice given to you by your health care provider. Make sure you discuss any questions you have with your health care provider. Document  Revised: 08/21/2020 Document Reviewed: 08/21/2020 Elsevier Patient Education  2021 Elsevier Inc. Quality Sleep Information, Adult Quality sleep is important for your mental and physical health. It also improves your quality of life. Quality sleep means you:  Are asleep for most of the time you are in bed.  Fall asleep within 30 minutes.  Wake up no more than once a night.  Are awake for no longer than 20 minutes if you do wake up during the night. Most adults need 7-8 hours of quality sleep each night. How can poor sleep affect me? If you do not get enough quality sleep, you may have:  Mood swings.  Daytime sleepiness.  Confusion.  Decreased reaction time.  Sleep disorders, such as insomnia and sleep apnea.  Difficulty with: ? Solving problems. ? Coping with stress. ? Paying attention. These issues may affect your performance and productivity at work, school, and at home. Lack of sleep may also put you at higher risk for accidents, suicide, and risky behaviors. If you do not get quality sleep you may also be at higher risk for several health problems, including:  Infections.  Type 2 diabetes.  Heart disease.  High blood pressure.  Obesity.  Worsening of long-term conditions, like arthritis, kidney disease, depression, Parkinson's disease,   and epilepsy. What actions can I take to get more quality sleep?  Stick to a sleep schedule. Go to sleep and wake up at about the same time each day. Do not try to sleep less on weekdays and make up for lost sleep on weekends. This does not work.  Try to get about 30 minutes of exercise on most days. Do not exercise 2-3 hours before going to bed.  Limit naps during the day to 30 minutes or less.  Do not use any products that contain nicotine or tobacco, such as cigarettes or e-cigarettes. If you need help quitting, ask your health care provider.  Do not drink caffeinated beverages for at least 8 hours before going to bed.  Coffee, tea, and some sodas contain caffeine.  Do not drink alcohol close to bedtime.  Do not eat large meals close to bedtime.  Do not take naps in the late afternoon.  Try to get at least 30 minutes of sunlight every day. Morning sunlight is best.  Make time to relax before bed. Reading, listening to music, or taking a hot bath promotes quality sleep.  Make your bedroom a place that promotes quality sleep. Keep your bedroom dark, quiet, and at a comfortable room temperature. Make sure your bed is comfortable. Take out sleep distractions like TV, a computer, smartphone, and bright lights.  If you are lying awake in bed for longer than 20 minutes, get up and do a relaxing activity until you feel sleepy.  Work with your health care provider to treat medical conditions that may affect sleeping, such as: ? Nasal obstruction. ? Snoring. ? Sleep apnea and other sleep disorders.  Talk to your health care provider if you think any of your prescription medicines may cause you to have difficulty falling or staying asleep.  If you have sleep problems, talk with a sleep consultant. If you think you have a sleep disorder, talk with your health care provider about getting evaluated by a specialist.      Where to find more information  National Sleep Foundation website: https://sleepfoundation.org  National Heart, Lung, and Blood Institute (NHLBI): www.nhlbi.nih.gov/files/docs/public/sleep/healthy_sleep.pdf  Centers for Disease Control and Prevention (CDC): www.cdc.gov/sleep/index.html Contact a health care provider if you:  Have trouble getting to sleep or staying asleep.  Often wake up very early in the morning and cannot get back to sleep.  Have daytime sleepiness.  Have daytime sleep attacks of suddenly falling asleep and sudden muscle weakness (narcolepsy).  Have a tingling sensation in your legs with a strong urge to move your legs (restless legs syndrome).  Stop breathing  briefly during sleep (sleep apnea).  Think you have a sleep disorder or are taking a medicine that is affecting your quality of sleep. Summary  Most adults need 7-8 hours of quality sleep each night.  Getting enough quality sleep is an important part of health and well-being.  Make your bedroom a place that promotes quality sleep and avoid things that may cause you to have poor sleep, such as alcohol, caffeine, smoking, and large meals.  Talk to your health care provider if you have trouble falling asleep or staying asleep. This information is not intended to replace advice given to you by your health care provider. Make sure you discuss any questions you have with your health care provider. Document Revised: 01/18/2018 Document Reviewed: 01/18/2018 Elsevier Patient Education  2021 Elsevier Inc.  

## 2020-11-06 NOTE — Addendum Note (Signed)
Addended by: Larey Seat on: 11/06/2020 10:18 AM   Modules accepted: Orders

## 2020-11-07 LAB — COMPREHENSIVE METABOLIC PANEL
ALT: 13 IU/L (ref 0–44)
AST: 10 IU/L (ref 0–40)
Albumin/Globulin Ratio: 2.1 (ref 1.2–2.2)
Albumin: 4.7 g/dL (ref 3.7–4.7)
Alkaline Phosphatase: 68 IU/L (ref 44–121)
BUN/Creatinine Ratio: 18 (ref 10–24)
BUN: 26 mg/dL (ref 8–27)
Bilirubin Total: 0.2 mg/dL (ref 0.0–1.2)
CO2: 23 mmol/L (ref 20–29)
Calcium: 9.7 mg/dL (ref 8.6–10.2)
Chloride: 104 mmol/L (ref 96–106)
Creatinine, Ser: 1.44 mg/dL — ABNORMAL HIGH (ref 0.76–1.27)
GFR calc Af Amer: 56 mL/min/{1.73_m2} — ABNORMAL LOW (ref >59)
GFR calc non Af Amer: 49 mL/min/{1.73_m2} — ABNORMAL LOW (ref >59)
Globulin, Total: 2.2 g/dL (ref 1.5–4.5)
Glucose: 91 mg/dL (ref 65–99)
Potassium: 4.8 mmol/L (ref 3.5–5.2)
Sodium: 138 mmol/L (ref 134–144)
Total Protein: 6.9 g/dL (ref 6.0–8.5)

## 2020-11-07 LAB — CBC WITH DIFFERENTIAL/PLATELET
Basophils Absolute: 0 10*3/uL (ref 0.0–0.2)
Basos: 0 %
EOS (ABSOLUTE): 0.2 10*3/uL (ref 0.0–0.4)
Eos: 3 %
Hematocrit: 43.4 % (ref 37.5–51.0)
Hemoglobin: 14.4 g/dL (ref 13.0–17.7)
Immature Grans (Abs): 0 10*3/uL (ref 0.0–0.1)
Immature Granulocytes: 0 %
Lymphocytes Absolute: 1.3 10*3/uL (ref 0.7–3.1)
Lymphs: 20 %
MCH: 31.1 pg (ref 26.6–33.0)
MCHC: 33.2 g/dL (ref 31.5–35.7)
MCV: 94 fL (ref 79–97)
Monocytes Absolute: 0.5 10*3/uL (ref 0.1–0.9)
Monocytes: 8 %
Neutrophils Absolute: 4.3 10*3/uL (ref 1.4–7.0)
Neutrophils: 69 %
Platelets: 204 10*3/uL (ref 150–450)
RBC: 4.63 x10E6/uL (ref 4.14–5.80)
RDW: 12.5 % (ref 11.6–15.4)
WBC: 6.2 10*3/uL (ref 3.4–10.8)

## 2020-11-07 LAB — LAMOTRIGINE LEVEL: Lamotrigine Lvl: 11.6 ug/mL (ref 2.0–20.0)

## 2020-11-11 NOTE — Progress Notes (Signed)
Increasing creatinine values, indicating chronic kidney disease. Normal CBC and lamotrigine level.

## 2020-11-12 ENCOUNTER — Telehealth: Payer: Self-pay

## 2020-11-12 ENCOUNTER — Encounter: Payer: Self-pay | Admitting: Neurology

## 2020-11-12 NOTE — Telephone Encounter (Signed)
LVM for pt to call me back to schedule sleep study  

## 2020-11-13 ENCOUNTER — Telehealth: Payer: Self-pay | Admitting: Neurology

## 2020-11-13 NOTE — Telephone Encounter (Signed)
Patient to review lab work, there was no answer.  Left voicemail advising a MyChart message has been sent with the results for him to review.  Advised patient to reply to MyChart or call the office with any questions.  If patient calls back please advise that overall lab work looks good, lamotrigine level was in normal range.  Kidney function was elevated which is not new.  Patient should continue taking medication as prescribed.

## 2020-11-13 NOTE — Telephone Encounter (Signed)
-----   Message from Larey Seat, MD sent at 11/11/2020 12:09 PM EST ----- Increasing creatinine values, indicating chronic kidney disease. Normal CBC and lamotrigine level.

## 2020-11-25 ENCOUNTER — Ambulatory Visit (INDEPENDENT_AMBULATORY_CARE_PROVIDER_SITE_OTHER): Payer: Medicare Other | Admitting: Neurology

## 2020-11-25 DIAGNOSIS — F32A Depression, unspecified: Secondary | ICD-10-CM

## 2020-11-25 DIAGNOSIS — G40909 Epilepsy, unspecified, not intractable, without status epilepticus: Secondary | ICD-10-CM

## 2020-11-25 DIAGNOSIS — G4731 Primary central sleep apnea: Secondary | ICD-10-CM

## 2020-11-25 DIAGNOSIS — G4700 Insomnia, unspecified: Secondary | ICD-10-CM

## 2020-11-25 DIAGNOSIS — G4752 REM sleep behavior disorder: Secondary | ICD-10-CM

## 2020-11-25 DIAGNOSIS — G4733 Obstructive sleep apnea (adult) (pediatric): Secondary | ICD-10-CM

## 2020-11-25 DIAGNOSIS — F5105 Insomnia due to other mental disorder: Secondary | ICD-10-CM

## 2020-12-02 ENCOUNTER — Encounter: Payer: Self-pay | Admitting: Neurology

## 2020-12-04 DIAGNOSIS — G4739 Other sleep apnea: Secondary | ICD-10-CM | POA: Insufficient documentation

## 2020-12-04 DIAGNOSIS — G4731 Primary central sleep apnea: Secondary | ICD-10-CM | POA: Insufficient documentation

## 2020-12-04 DIAGNOSIS — Z20822 Contact with and (suspected) exposure to covid-19: Secondary | ICD-10-CM | POA: Diagnosis not present

## 2020-12-04 MED ORDER — CLONAZEPAM 0.5 MG PO TABS: ORAL_TABLET | ORAL | 0 refills | Status: DC

## 2020-12-04 NOTE — Addendum Note (Signed)
Addended by: Larey Seat on: 12/04/2020 01:48 PM   Modules accepted: Orders

## 2020-12-04 NOTE — Procedures (Signed)
PATIENT'S NAME:  Kenneth Ortega, Kenneth Ortega DOB:      Nov 29, 1950      MR#:    [id number]     DATE OF RECORDING: 11/25/2020 [facility] REFERRING M.D.:  [name], MD Study Performed:   Baseline Polysomnogram HISTORY:  --- Kenneth Ortega is a 70 - year- old Caucasian male patient seen here on 11/06/2020 for transfer of sleep care. He is established with Dr Jannifer Franklin, who is now part-time. He has developed a more severe insomnia problem over the last 12 month. He is often able to go to sleep, but wakes up after only 3 hours of sleep, feeling alert- but then suffers through the day feeling unrested, unrefreshed.   " I have had nasal congestion over 50 years- and have seizures all day- absence spells. He wonders if his seizures are what keeps him from sleeping. Dr Jannifer Franklin had done several EEG s with absence seizures being documented. His first manifestation of a seizure followed a sleepless period of 3 days- the sleep deprivation put him over the edge.  There was a time he was dependent on AMBIEN and he has weaned off started Prozac, which helped to sleep, too.     The patient had the first sleep study in the year 4/ 2017, with a result of an AHI (Apnea Hypopnea index) of 21/h. He did not come back for an ordered CPAP titration in 2020. Kenneth Ortega has been very compliant use of his CPAP for 9 hours and 48 minutes on average -use of CPAP is set to 10 cm 12 cm of water was 3 cm expiratory pressure relief, his residual AHI is 2.8/h which is excellent he does have a moderate to high air leak at the 95th percentile of 28.6 L/min.   It does not seem to bother him that much.  His mask is in nasal mask he is using a Psychologist, clinical ESON, which is a nasal mask.  12/03/19 Kenneth Ortega 's download indicates excellent compliance use 30/30 days, 97% greater than 4 hours use.  Average total usage was 8 hours 58 minutes.  His minimum pressure is set at 6, max at 12.  His leak in the 95th percentile was 22.2, AHI was 6.1.  He  reports that he feels sleepy all the time, he feels he does sleep during the night, but the next day he has fatigue. He continues to report daily episodes of seizures that are brief, described as impairing his concentration, he reports altered speech.  He says nobody will even notice it.  They do not interfere with his ability to function.  He has been having these episodes for years The patient endorsed the Epworth Sleepiness Scale at 13 points.   The patient's weight 198 pounds with a height of 70 (inches), resulting in a BMI of 28.4 kg/m2. The patient's neck circumference measured 17 inches.  CURRENT MEDICATIONS: Benicar, Farxiga, Prozac, Gabapentin, Vicodin, Lamictal, Simvastatin, Flomax  PROCEDURE:  This is a multichannel digital polysomnogram utilizing the Somnostar 11.2 system.  Electrodes and sensors were applied and monitored per AASM Specifications.   EEG, EOG, Chin and Limb EMG, were sampled at 200 Hz.  ECG, Snore and Nasal Pressure, Thermal Airflow, Respiratory Effort, CPAP Flow and Pressure, Oximetry was sampled at 50 Hz. Digital video and audio were recorded.      BASELINE STUDY: Lights Out was at 22:26 and Lights On at 04:50.  Total recording time (TRT) was 384.5 minutes, with a total sleep time (  TST) of 141.5 minutes.   The patient's sleep latency was 48.5 minutes.  REM latency was 288.5 minutes.  The sleep efficiency was poor at 36.8 %.     SLEEP ARCHITECTURE: WASO (Wake after sleep onset) was 168.5 minutes.  There were 18 minutes in Stage N1, 105.5 minutes Stage N2, 0 minutes Stage N3 and 18 minutes in Stage REM.  The percentage of Stage N1 was 12.7%, Stage N2 was 74.6%, Stage N3 was 0% and Stage R (REM sleep) was 12.7%.   RESPIRATORY ANALYSIS:  There were a total of 90 respiratory events:  12 obstructive apneas, 50 central apneas and 5 mixed apneas with a total of 67 apneas and an apnea index (AI) of 28.4 /hour. There were 23 hypopneas with a hypopnea index of 9.8 /hour.     The total  APNEA/HYPOPNEA INDEX (AHI) was 38.2/hour.  4 events occurred in REM sleep and 93 events in NREM. The REM AHI was 13.3 /hour, versus a non-REM AHI of 41.8. The patient spent 32 minutes of total sleep time in the supine position and 110 minutes in non-supine. The supine AHI was 88.2/h versus a non-supine AHI of 23.5/h.  OXYGEN SATURATION & C02:  The Wake baseline 02 saturation was 92%, with the lowest being 85%. Time spent below 89% saturation equaled 5 minutes. The Periodic Limb Movement (PLM) index was high, but no arousals were noted. These PLMs were seen in REM sleep and NREM sleep.  Audio and video analysis did not show any abnormal or unusual movements, behaviors, phonations or vocalizations.  Loud Snoring was intermittently noted. EKG was in keeping with normal sinus rhythm (NSR).  IMPRESSION:  1. Primary insomnia- inability to initiate and maintain sleep. 2. Evidence of REM sleep PLMs supporting diagnosis of REM BD. 3. Central Sleep Apnea dominated the AHI, severe apnea, non- REM based and supine sleep worsened the apnea index as well. Snoring and central apnea were both present.   4. Sinus bradycardia on EKG 5. EEG without electrographic seizure activity.    RECOMMENDATIONS:  I cannot correlate the Insomnia to a physiological disorder.  There is clearly apnea present and I am surprised that his mostly central sleep apnea should have responded so well to PAP therapy in the past.  I support further use of PAP therapy as the residual AHI is low , but PAP therapy has likely no effect on the main problem of Insomnia.  I like to invite the patient for an in-lab full night titration and we will see if PLMs resolve under REM sleep and I while apnea is treated.    I certify that I have reviewed the entire raw data recording prior to the issuance of this report in accordance with the Standards of Accreditation of the American Academy of Sleep Medicine (AASM)   Larey Seat, MD [place],  [facility]

## 2020-12-09 ENCOUNTER — Telehealth: Payer: Self-pay | Admitting: Neurology

## 2020-12-09 ENCOUNTER — Encounter: Payer: Self-pay | Admitting: Neurology

## 2020-12-09 NOTE — Telephone Encounter (Signed)
Pt return called.  I was able to review the sleep study in detail with the patient.I advised pt that Dr. Brett Fairy reviewed their sleep study results and found pt didn't sleep a lot but in the time spent asleep severe sleep apnea was noted, mostly central apnea. Pt advised that REM BD was also present in which she has prescribed as needed clonazepam for the pt to help. Dr Dohmeier recommends that pt be treated with a cpap. Dr. Brett Fairy recommends that pt return for a repeat sleep study in order to properly titrate the cpap and ensure a good mask fit. Pt is agreeable to returning for a titration study. I advised pt that our sleep lab will file with pt's insurance and call pt to schedule the sleep study when we hear back from the pt's insurance regarding coverage of this sleep study. Pt verbalized understanding of results. Pt had no questions at this time but was encouraged to call back if questions arise.

## 2020-12-09 NOTE — Telephone Encounter (Signed)
Called patient to discuss sleep study results. No answer at this time. LVM for the patient to call back.  Will send a mychart message as well. 

## 2020-12-09 NOTE — Telephone Encounter (Signed)
-----   Message from Larey Seat, MD sent at 12/04/2020  1:48 PM EST ----- IMPRESSION:  1. Primary insomnia- inability to initiate and maintain sleep. 2. Evidence of REM sleep PLMs supporting diagnosis of REM BD. 3. Central Sleep Apnea dominated the AHI, severe apnea, non- REM based and supine sleep worsened the apnea index as well. Snoring and central apnea were both present.   4. Sinus bradycardia on EKG 5. EEG without electrographic seizure activity.    RECOMMENDATIONS:  I cannot correlate the Insomnia to a physiological disorder.  There is clearly apnea present and I am surprised that his mostly central sleep apnea should have responded so well to PAP therapy in the past.  I support further use of PAP therapy as the residual AHI is low , but PAP therapy has likely no effect on the main problem of Insomnia.  I like to invite the patient for an in-lab full night titration and we will see if PLMs resolve under REM sleep and I while apnea is treated.

## 2020-12-16 ENCOUNTER — Telehealth: Payer: Self-pay

## 2020-12-16 NOTE — Telephone Encounter (Signed)
LVM for pt to call me back to schedule sleep study  

## 2020-12-16 NOTE — Telephone Encounter (Signed)
Pt has called asking for a call from Casey,RN to discuss his sleep study.  Pt asking for a call on his home #

## 2020-12-17 NOTE — Telephone Encounter (Signed)
Called the patient back.  This call was intended for the sleep lab.  Patient was needing to be scheduled for sleep study and he was scheduled earlier this morning.  Patient was appreciative for checking in and had no questions for me.

## 2020-12-21 ENCOUNTER — Other Ambulatory Visit: Payer: Self-pay

## 2020-12-21 ENCOUNTER — Ambulatory Visit (INDEPENDENT_AMBULATORY_CARE_PROVIDER_SITE_OTHER): Payer: Medicare Other | Admitting: Neurology

## 2020-12-21 DIAGNOSIS — G4700 Insomnia, unspecified: Secondary | ICD-10-CM

## 2020-12-21 DIAGNOSIS — G4730 Sleep apnea, unspecified: Secondary | ICD-10-CM

## 2020-12-21 DIAGNOSIS — G4731 Primary central sleep apnea: Secondary | ICD-10-CM | POA: Diagnosis not present

## 2020-12-21 DIAGNOSIS — R9401 Abnormal electroencephalogram [EEG]: Secondary | ICD-10-CM

## 2020-12-21 DIAGNOSIS — G4739 Other sleep apnea: Secondary | ICD-10-CM

## 2020-12-21 DIAGNOSIS — G4752 REM sleep behavior disorder: Secondary | ICD-10-CM

## 2020-12-22 ENCOUNTER — Ambulatory Visit: Payer: Self-pay | Admitting: Neurology

## 2020-12-29 ENCOUNTER — Ambulatory Visit (INDEPENDENT_AMBULATORY_CARE_PROVIDER_SITE_OTHER): Payer: Medicare Other | Admitting: Dermatology

## 2020-12-29 ENCOUNTER — Encounter: Payer: Self-pay | Admitting: Dermatology

## 2020-12-29 ENCOUNTER — Other Ambulatory Visit: Payer: Self-pay

## 2020-12-29 DIAGNOSIS — D485 Neoplasm of uncertain behavior of skin: Secondary | ICD-10-CM

## 2020-12-29 DIAGNOSIS — D225 Melanocytic nevi of trunk: Secondary | ICD-10-CM

## 2020-12-29 DIAGNOSIS — Z85820 Personal history of malignant melanoma of skin: Secondary | ICD-10-CM | POA: Diagnosis not present

## 2020-12-29 NOTE — Patient Instructions (Signed)

## 2020-12-31 DIAGNOSIS — R9401 Abnormal electroencephalogram [EEG]: Secondary | ICD-10-CM | POA: Insufficient documentation

## 2020-12-31 NOTE — Progress Notes (Signed)
Audio and video analysis did not show any abnormal or unusual movements, behaviors, phonations or vocalizations.  EEG showed at least twice seizure discharges, electrographic seizure activity. I printed a screen shot.  EKG was in keeping with normal sinus rhythm (NSR).     DIAGNOSIS 1. Central Sleep Apnea worsened under CPAP and BIPAP- both modalities failed to control central apnea, and ASV was implemented. 2. ASV worked very well under a medium size ESON mask- at pressures of min 3 cm water, max 11 cm water and EEPAP of 14 cm water. 3. EEG with electrographic seizure activity.     PLANS/RECOMMENDATIONS: ADAPT SV will be prescribed with the named settings under heated humidification and use of an ESON medium nasal mask.  ASV 11 cm water maximum pressure support, 3 cm water minimum pressure support and 14 cm EEP. 1. Any apnea patient should avoid use of excessive sedatives, hypnotics, and alcohol consumption 2. CPAP and ASV therapy compliance is defined as 4 hours of nightly use.

## 2020-12-31 NOTE — Addendum Note (Signed)
Addended by: Larey Seat on: 12/31/2020 05:22 PM   Modules accepted: Orders

## 2020-12-31 NOTE — Procedures (Signed)
PATIENT'S NAME:  Kenneth Ortega, Kenneth Ortega DOB:      Jan 13, 1951      MR#:    [id number]     DATE OF RECORDING: 12/21/2020 Oneita Jolly REFERRING M.D.:  [name], MD Study Performed:   Titration to BiPAP and ASV- adapt servo ventilation HISTORY:   Kenneth Ortega Kenneth Ortega is a 70 - year- old Caucasian male patient seen here on 11/06/2020 for sleep care. He is established with Dr Jannifer Franklin.  He returns after having undergone a sleep study on 11-25-2020, documenting a central apnea condition. He has used CPAP at home with good apnea resolution.  He has developed a more severe insomnia problem over the last 12 month. He is often able to go to sleep, but wakes up after only 3 hours of sleep, feeling alert- but then suffers through the day feeling unrested, unrefreshed.  " I have had nasal congestion over 50 years- and have seizures all day- absence spells. He wonders if his seizures are what keeps him from sleeping. Dr Jannifer Franklin had done several EEG s with absence seizures being documented. His first manifestation of a seizure followed a sleepless period of 3 days- the sleep deprivation put him over the edge.  Central sleep apnea, severe AHI of 38.2/h and supine AHI of 88/h - of the 67 total apneas 50 apneas were central.    The patient endorsed the Epworth Sleepiness Scale at 13 points.   The patient's weight 197 pounds with a height of 70 (inches), resulting in a BMI of 28.1 kg/m2. The patient's neck circumference measured 18 inches.  CURRENT MEDICATIONS: Benicar, Farxiga, Prozac, Gabapentin, Lamictal, Simvastatin, Flomax, Klonopin   PROCEDURE:  This is a multichannel digital polysomnogram utilizing the SomnoStar 11.2 system.  Electrodes and sensors were applied and monitored per AASM Specifications.   EEG, EOG, Chin and Limb EMG, were sampled at 200 Hz.  ECG, Snore and Nasal Pressure, Thermal Airflow, Respiratory Effort, CPAP Flow and Pressure, Oximetry was sampled at 50 Hz. Digital video and audio were recorded.      The  patient was fitted with a nasal ESON - mask in medium. CPAP was initiated at 9 cmH20 with heated humidity per AASM standards and pressure was advanced to 12 cmH20 because of hypopneas, apneas and desaturations. No resolution of apnea was seen, change to BiPAP, starting at 13/9 cm and advanced to a BiPAP pressure of 20/16 cmH20, there was no reduction of the AHI - change to ASV was needed. The pressure of EPAP 14 , with a max PS, 11 cm H20 , and a min PS of 3 cm reached improvement of central sleep apnea with an AHI of 0.6 /hover 95 minutes of sleep - only 1 central apnea remained!   Lights Out was at 21:04 and Lights On at 05:07. Total recording time (TRT) was 483 minutes, with a total sleep time (TST) of 324 minutes. The patient's sleep latency was 60.5 minutes. REM latency was 247 minutes.  The sleep efficiency was 67.1 %. The sleep architecture improved with ASV.    SLEEP ARCHITECTURE: WASO (Wake after sleep onset) was 152 minutes.  There were 66 minutes in Stage N1, 200 minutes Stage N2, 0 minutes Stage N3 and 58 minutes in Stage REM.  The percentage of Stage N1 was 20.4%, Stage N2 was 61.7%, Stage N3 was 0% and Stage R (REM sleep) was 17.9%. The sleep architecture was notable for REM rebound under ASV.   RESPIRATORY ANALYSIS:  There was a total of 97  respiratory events: 4 obstructive apneas, 67 central apneas and 0 mixed apneas with a total of 71 apneas and an apnea index (AI) of 13.1 /hour. There were 26 hypopneas with a hypopnea index of 4.8/hour. The patient also had 0 respiratory event related arousals (RERAs).      The total APNEA/HYPOPNEA INDEX  (AHI) was 18.0 /hour and the total RESPIRATORY DISTURBANCE INDEX was 18. /hour  4 events occurred in REM sleep and 93 events in NREM. The REM AHI was 4.1 /hour versus a non-REM AHI of 21. /hour.  The patient spent 111.5 minutes of total sleep time in the supine position and 213 minutes in non-supine. The supine AHI was 29.6, versus a non-supine AHI of  11.9.  OXYGEN SATURATION & C02:  The baseline 02 saturation was 96%, with the lowest being 89%. Time spent below 89% saturation equaled 0 minutes. The arousals were noted as: 24 were spontaneous, 13 were associated with PLMs, 63 were associated with respiratory events.  PERIODIC LIMB MOVEMENTS:  The patient had a total of 413 Periodic Limb Movements. The Periodic Limb Movement (PLM) Arousal index was 2.4 /hour.   Audio and video analysis did not show any abnormal or unusual movements, behaviors, phonations or vocalizations.  EEG showed at least twice seizure discharges, electrographic seizure activity. I printed a screen shot.  EKG was in keeping with normal sinus rhythm (NSR).     DIAGNOSIS 1. Central Sleep Apnea worsened under CPAP and BIPAP- both modalities failed to control central apnea, and ASV was implemented. 2. ASV worked very well under a medium size ESON mask- at pressures of min 3 cm water, max 11 cm water and EEPAP of 14 cm water. 3. EEG with electrographic seizure activity.     PLANS/RECOMMENDATIONS: ADAPT SV will be prescribed with the named settings under heated humidification and use of an ESON medium nasal mask.  ASV 11 cm water maximum pressure support, 3 cm water minimum pressure support and 14 cm EEP. 1. Any apnea patient should avoid use of excessive sedatives, hypnotics, and alcohol consumption 2. CPAP and ASV therapy compliance is defined as 4 hours of nightly use.      DISCUSSION:   A follow up appointment will be scheduled in the Sleep Clinic at [facility].   Please call [phone] with any questions.      I certify that I have reviewed the entire raw data recording prior to the issuance of this report in accordance with the Standards of Accreditation of the American Academy of Sleep Medicine (AASM)      [] Larey Seat, M.D. Diplomat, Tax adviser of Psychiatry and Neurology  Diplomat, Tax adviser of Sleep  Medicine Market researcher, [employer] [facility] at [employer]

## 2021-01-01 ENCOUNTER — Telehealth: Payer: Self-pay | Admitting: Neurology

## 2021-01-01 NOTE — Telephone Encounter (Signed)
Called the patient to review SSR and wife answered.  I was able to review the sleep study in detail with her per DPR.  Advised that he was best treated with an ASV machine.  Informed the wife that we will get the order sent over to [facility] for the patient to get set up with an ASV machine.  Informed the patient's wife compliance is still using the machine greater than 4 hours each night as insurance will monitor that.  Advised the patient's wife that he would also need a 19 to 90-day follow-up visit.  We went ahead and scheduled the patient for a visit June 9 at 11:30 AM with Ward Givens, NP.  Instructed them to contact our office if this appointment falls outside of that 61 to 90-day follow-up.  Advised a letter will be sent in the mail for the patient to have as a reference.  Instructed them to contact [employer] if they have not heard from them within 1 to 2 weeks.  Patient's wife verbalized understanding and had no further questions.

## 2021-01-01 NOTE — Telephone Encounter (Signed)
-----   Message from Larey Seat, MD sent at 12/31/2020  5:22 PM EST ----- Audio and video analysis did not show any abnormal or unusual movements, behaviors, phonations or vocalizations.  EEG showed at least twice seizure discharges, electrographic seizure activity. I printed a screen shot.  EKG was in keeping with normal sinus rhythm (NSR).     DIAGNOSIS 1. Central Sleep Apnea worsened under CPAP and BIPAP- both modalities failed to control central apnea, and ASV was implemented. 2. ASV worked very well under a medium size ESON mask- at pressures of min 3 cm water, max 11 cm water and EEPAP of 14 cm water. 3. EEG with electrographic seizure activity.     PLANS/RECOMMENDATIONS: ADAPT SV will be prescribed with the named settings under heated humidification and use of an ESON medium nasal mask.  ASV 11 cm water maximum pressure support, 3 cm water minimum pressure support and 14 cm EEP. 1. Any apnea patient should avoid use of excessive sedatives, hypnotics, and alcohol consumption 2. CPAP and ASV therapy compliance is defined as 4 hours of nightly use.

## 2021-01-02 ENCOUNTER — Other Ambulatory Visit: Payer: Self-pay | Admitting: Neurology

## 2021-01-05 ENCOUNTER — Telehealth: Payer: Self-pay | Admitting: Neurology

## 2021-01-05 NOTE — Telephone Encounter (Addendum)
Pt request refill clonazePAM (KLONOPIN) 0.5 MG tablet at [id number]. Prescription is not enough can you add 30 days until my next appt. Would like a call from the nurse.

## 2021-01-05 NOTE — Telephone Encounter (Signed)
I have routed this request to Dr Dohmeier for review. The pt is due for the medication and [place] registry was verified.  

## 2021-01-08 ENCOUNTER — Telehealth: Payer: Self-pay

## 2021-01-08 NOTE — Telephone Encounter (Signed)
Phone call to patient with his pathology results. Voicemail left for patient to give the office a call back.  ?

## 2021-01-08 NOTE — Telephone Encounter (Signed)
Phone call from patient returning our call for his pathology results. Pathology results given to patient.  

## 2021-01-08 NOTE — Telephone Encounter (Signed)
-----   Message from Lavonna Monarch, MD sent at 01/06/2021  9:55 PM EDT ----- Schedule routine visit for minor shave.

## 2021-01-15 ENCOUNTER — Encounter: Payer: Self-pay | Admitting: Dermatology

## 2021-01-16 NOTE — Progress Notes (Signed)
   Follow-Up Visit   Subjective  Kenneth Ortega is a 70 y.o. male who presents for the following: Follow-up (Biopsy on back deferred at December visit due to travel).  Needs biopsy on lesion on back plus check other spots. Location:  Duration:  Quality:  Associated Signs/Symptoms: Modifying Factors:  Severity:  Timing: Context:   Objective  Well appearing patient in no apparent distress; mood and affect are within normal limits. Objective  Left Lower Back: Bi-chromic gray-brown 5 mm macule with dermoscopic atypia     Objective  Chest - Medial [facility]): No sign recurrence or regional adenopathy.    All skin waist up examined.   Assessment & Plan    Neoplasm of uncertain behavior of skin Left Lower Back  Skin / nail biopsy Type of biopsy: tangential   Informed consent: discussed and consent obtained   Timeout: patient name, date of birth, surgical site, and procedure verified   Anesthesia: the lesion was anesthetized in a standard fashion   Anesthetic:  1% lidocaine w/ epinephrine 1-100,000 local infiltration Instrument used: flexible razor blade   Hemostasis achieved with: aluminum chloride and electrodesiccation   Outcome: patient tolerated procedure well   Post-procedure details: wound care instructions given    Specimen 1 - Surgical pathology Differential Diagnosis: atypical mole  Check Margins: No  Personal history of malignant melanoma of skin Chest - Medial (Center)  Annual skin examination, encouraged to self examine twice annually, continued ultraviolet protection.      I, Lavonna Monarch, MD, have reviewed all documentation for this visit.  The documentation on 01/16/21 for the exam, diagnosis, procedures, and orders are all accurate and complete.

## 2021-02-13 DIAGNOSIS — K645 Perianal venous thrombosis: Secondary | ICD-10-CM | POA: Diagnosis not present

## 2021-02-17 ENCOUNTER — Other Ambulatory Visit: Payer: Self-pay

## 2021-02-17 ENCOUNTER — Ambulatory Visit (INDEPENDENT_AMBULATORY_CARE_PROVIDER_SITE_OTHER): Payer: Medicare Other | Admitting: Dermatology

## 2021-02-17 ENCOUNTER — Encounter: Payer: Self-pay | Admitting: Dermatology

## 2021-02-17 DIAGNOSIS — D229 Melanocytic nevi, unspecified: Secondary | ICD-10-CM

## 2021-02-17 DIAGNOSIS — L988 Other specified disorders of the skin and subcutaneous tissue: Secondary | ICD-10-CM | POA: Diagnosis not present

## 2021-02-17 DIAGNOSIS — D225 Melanocytic nevi of trunk: Secondary | ICD-10-CM

## 2021-02-17 NOTE — Patient Instructions (Signed)

## 2021-02-23 ENCOUNTER — Ambulatory Visit: Payer: Medicare Other | Admitting: Neurology

## 2021-02-25 ENCOUNTER — Encounter: Payer: Self-pay | Admitting: Physician Assistant

## 2021-02-25 ENCOUNTER — Other Ambulatory Visit: Payer: Self-pay

## 2021-02-25 ENCOUNTER — Ambulatory Visit (INDEPENDENT_AMBULATORY_CARE_PROVIDER_SITE_OTHER): Payer: Medicare Other | Admitting: Physician Assistant

## 2021-02-25 DIAGNOSIS — L080 Pyoderma: Secondary | ICD-10-CM | POA: Diagnosis not present

## 2021-02-25 MED ORDER — DOXYCYCLINE HYCLATE 100 MG PO TABS: 100.0000 mg | ORAL_TABLET | Freq: Two times a day (BID) | ORAL | 0 refills | Status: DC

## 2021-02-25 MED ORDER — MUPIROCIN 2 % EX OINT: 1.0000 "application " | TOPICAL_OINTMENT | Freq: Every day | CUTANEOUS | 0 refills | Status: DC

## 2021-02-25 NOTE — Progress Notes (Signed)
   Follow-Up Visit   Subjective  Kenneth Ortega is a 70 y.o. male who presents for the following: Wound Check (Left lower back- red and tender with drainage- patient is getting ready to head out of town for 1 month and just wanted it checked before he goes. ).   The following portions of the chart were reviewed this encounter and updated as appropriate:  Tobacco  Allergies  Meds  Problems  Med Hx  Surg Hx  Fam Hx      Objective  Well appearing patient in no apparent distress; mood and affect are within normal limits.  A focused examination was performed including back. Relevant physical exam findings are noted in the Assessment and Plan.  Objective  Left Lower Back: Wound with yellow exudate.   Assessment & Plan  Pyoderma Left Lower Back  mupirocin ointment (BACTROBAN) 2 % - Left Lower Back  doxycycline (VIBRA-TABS) 100 MG tablet - Left Lower Back  Anaerobic and Aerobic Culture - Left Lower Back    I, Etrulia Zarr, PA-C, have reviewed all documentation's for this visit.  The documentation on 02/25/21 for the exam, diagnosis, procedures and orders are all accurate and complete.

## 2021-02-27 ENCOUNTER — Encounter: Payer: Self-pay | Admitting: Dermatology

## 2021-02-27 NOTE — Progress Notes (Signed)
   Follow-Up Visit   Subjective  Kenneth Ortega is a 70 y.o. male who presents for the following: Procedure [facility] left lower back).  Atypical mole Location: Back Duration:  Quality:  Associated Signs/Symptoms: Modifying Factors:  Severity:  Timing: Context: Severe atypia with narrow margins; will do wider shave excision  Objective  Well appearing patient in no apparent distress; mood and affect are within normal limits. Objective  Left Lower Back: Biopsy site identified by nurse and me.  1.5 mm margins beyond biopsy marked (pathology showed narrow clear margins).    A focused examination was performed including Torso. Relevant physical exam findings are noted in the Assessment and Plan.   Assessment & Plan    Atypical mole Left Lower Back  Epidermal / dermal shaving - Left Lower Back  Lesion diameter (cm):  2.2 Informed consent: discussed and consent obtained   Timeout: patient name, date of birth, surgical site, and procedure verified   Procedure prep:  Patient was prepped and draped in usual sterile fashion Prep type:  Chlorhexidine Anesthesia: the lesion was anesthetized in a standard fashion   Anesthetic:  1% lidocaine w/ epinephrine 1-100,000 local infiltration Instrument used: DermaBlade   Hemostasis achieved with: ferric subsulfate   Outcome: patient tolerated procedure well   Post-procedure details: sterile dressing applied and wound care instructions given   Dressing type: petrolatum gauze, petrolatum and bandage    Specimen 1 - Surgical pathology Differential Diagnosis: R/O Atypia [id number]  Check Margins: No      I, Lavonna Monarch, MD, have reviewed all documentation for this visit.  The documentation on 02/27/21 for the exam, diagnosis, procedures, and orders are all accurate and complete.

## 2021-03-03 LAB — ANAEROBIC AND AEROBIC CULTURE
MICRO NUMBER:: 11849241
MICRO NUMBER:: 11849242
SPECIMEN QUALITY:: ADEQUATE
SPECIMEN QUALITY:: ADEQUATE

## 2021-03-10 ENCOUNTER — Ambulatory Visit: Payer: Medicare Other | Admitting: Neurology

## 2021-04-02 ENCOUNTER — Ambulatory Visit: Payer: Self-pay | Admitting: Adult Health

## 2021-05-03 DIAGNOSIS — Z20822 Contact with and (suspected) exposure to covid-19: Secondary | ICD-10-CM | POA: Diagnosis not present

## 2021-05-05 ENCOUNTER — Ambulatory Visit: Payer: Medicare Other | Admitting: Neurology

## 2021-05-14 ENCOUNTER — Telehealth: Payer: Self-pay | Admitting: Adult Health

## 2021-05-14 NOTE — Telephone Encounter (Signed)
8/18 OV reschedule due to NP out of office.

## 2021-05-18 ENCOUNTER — Other Ambulatory Visit: Payer: Self-pay | Admitting: *Deleted

## 2021-05-18 MED ORDER — CLONAZEPAM 0.5 MG PO TABS: 0.2500 mg | ORAL_TABLET | Freq: Every evening | ORAL | 0 refills | Status: DC | PRN

## 2021-06-03 DIAGNOSIS — N183 Chronic kidney disease, stage 3 unspecified: Secondary | ICD-10-CM | POA: Diagnosis not present

## 2021-06-11 ENCOUNTER — Ambulatory Visit: Payer: Medicare Other | Admitting: Adult Health

## 2021-06-25 ENCOUNTER — Ambulatory Visit (INDEPENDENT_AMBULATORY_CARE_PROVIDER_SITE_OTHER): Payer: Medicare Other | Admitting: Adult Health

## 2021-06-25 ENCOUNTER — Encounter: Payer: Self-pay | Admitting: Adult Health

## 2021-06-25 VITALS — BP 114/72 | HR 62 | Ht 70.0 in | Wt 192.5 lb

## 2021-06-25 DIAGNOSIS — G40909 Epilepsy, unspecified, not intractable, without status epilepticus: Secondary | ICD-10-CM | POA: Diagnosis not present

## 2021-06-25 DIAGNOSIS — G4730 Sleep apnea, unspecified: Secondary | ICD-10-CM | POA: Diagnosis not present

## 2021-06-25 DIAGNOSIS — G4700 Insomnia, unspecified: Secondary | ICD-10-CM | POA: Diagnosis not present

## 2021-06-25 DIAGNOSIS — Z9989 Dependence on other enabling machines and devices: Secondary | ICD-10-CM

## 2021-06-25 DIAGNOSIS — G4733 Obstructive sleep apnea (adult) (pediatric): Secondary | ICD-10-CM

## 2021-06-25 MED ORDER — GABAPENTIN 300 MG PO CAPS: 300.0000 mg | ORAL_CAPSULE | Freq: Every day | ORAL | 3 refills | Status: AC

## 2021-06-25 NOTE — Progress Notes (Signed)
PATIENT: Kenneth Ortega DOB: [birth year]  REASON FOR VISIT: follow up HISTORY FROM: patient PRIMARY NEUROLOGIST:   HISTORY OF PRESENT ILLNESS: Today 06/25/21:  [name] is a 70 year old male with a history of obstructive sleep apnea on ASV.  He returns today for follow-up.  He reports that the machine is working well for him.  Denies any issues with machine  Seizures: Currently on Lamictal 200 mg twice a day.  Lamictal level in January was in normal range.  Denies any seizure events.  Insomnia: Continues to struggle with insomnia.  Dr. Jannifer Franklin started the patient on gabapentin after a knee surgery to help with sleep.  Patient reports that he does not take gabapentin nightly.  He reports that he has clonazepam and reports that he also does not take it nightly.  He states that most nights he tries to go to bed without use of medication.  If he wakes up after 1 to 2 hours of sleep he may take gabapentin or clonazepam if he is unable to fall back to sleep.  Patient does not watch television in bed.  Typically goes to bed around 9-10 PM and wakes up at 7 AM.  Reports that most mornings he wakes up but after he eats breakfast he is sleepy.    HISTORY (Copied from Dr.Dohmeier's note) 83 - year- old  Caucasian male patient seen here after his wife requested a transfer of care to me on 11/06/2020 . He was established with Dr Jannifer Franklin, who is now part-time. He has developed a more severe insomnia problem over the last 12 month . He is often able to go to sleep, but wakes up after only 3 hours of sleep, feeling alert- but then suffers through the day feeling unrested , unrefreshed.   Chief concern according to patient : I have had nasal congestion over 50 years- and have seizures all day- absence spells. He wonders if his seizures are what keeps him from sleeping. Dr Jannifer Franklin had done several EEG s with absence documented. heis first manifestation of a seizure followed insomnia for 3 days- the sleep  deprivation put him over the edge.  There was a time he was dependent on AMBIEN and he has weaned off started Prozac, which helped to sleep, too.      I have the pleasure of seeing Kenneth Ortega today, a right-handed White or Caucasian male with OSA and insomnia, nasal deviated septum. He uses nasal mist every night-  He  has a past medical history of Atypical mole (10/29/2009), Atypical mole (07/22/2015), Atypical mole (02/16/2017), Basal cell carcinoma (04/30/2009), Basal cell carcinoma (10/20/2016), Basal cell carcinoma (11/18/2016), Cancer ([place]), Chronic renal insufficiency, Complication of anesthesia, Depression, Diabetes mellitus without complication ([place]), Dyslipidemia, Headache(784.0), History of kidney stones, Hypertension, Melanoma ([place]) (03/14/2012), Memory disturbance, Obstructive sleep apnea on CPAP, Prostatitis, Renal cell carcinoma of left kidney ([place]), monokidney- last GFR 09-2020 [facility] was 52/h,  and absence Seizure ([place]).   The patient had the first sleep study in the year 4/ 2017, with a result of an AHI ( Apnea Hypopnea index)  of 21/h. He did not come back for an ordered CPAP titration in 2020.  Mr. Eula Fried has been very compliant use of his CPAP under percent and times a day for 9 hours and 48 minutes on average use of CPAP is set to 10 cm 12 cm of water was 3 cm expiratory pressure relief his residual AHI is 2.8 which is excellent he does have a  moderate to high air leak at the 95th percentile of 28.6 L/min.  It does not seem to bother him that much.  His mask is in nasal mask he is using a Psychologist, clinical ESON, which is a nasal mask.      REVIEW OF SYSTEMS: Out of a complete 14 system review of symptoms, the patient complains only of the following symptoms, and all other reviewed systems are negative.  See HPI   ALLERGIES: Allergies  Allergen Reactions   Tylox [Oxycodone-Acetaminophen] Other (See Comments)    Hallucinations/insomnia . Does not tolerate any of the  "oxys". Prefers hydrocodone   Keppra [Levetiracetam] Other (See Comments)    Patient unsure of reaction   Oxycontin [Oxycodone] Other (See Comments)    hallucinations     HOME MEDICATIONS: Outpatient Medications Prior to Visit  Medication Sig Dispense Refill   BENICAR 5 MG tablet Take 5 mg by mouth at bedtime.      clonazePAM (KLONOPIN) 0.5 MG tablet Take 0.5-1 tablets (0.25-0.5 mg total) by mouth at bedtime as needed for anxiety. For REM BD - use as needed. 90 tablet 0   dapagliflozin propanediol (FARXIGA) 5 MG TABS tablet Take 5 mg by mouth daily.      FLUoxetine (PROZAC) 20 MG capsule Take 20 mg by mouth daily.     gabapentin (NEURONTIN) 300 MG capsule Take 2 capsules (600 mg total) by mouth at bedtime. 90 capsule 3   lamoTRIgine (LAMICTAL) 200 MG tablet Take 1 tablet (200 mg total) by mouth 2 (two) times daily. 180 tablet 4   simvastatin (ZOCOR) 10 MG tablet Take 10 mg by mouth at bedtime.      simvastatin (ZOCOR) 20 MG tablet Take 20 mg by mouth daily.      tamsulosin (FLOMAX) 0.4 MG CAPS capsule Take 0.4 mg by mouth daily.     doxycycline (VIBRA-TABS) 100 MG tablet Take 1 tablet (100 mg total) by mouth in the morning and at bedtime. 28 tablet 0   mupirocin ointment (BACTROBAN) 2 % Apply 1 application topically daily. 22 g 0   No facility-administered medications prior to visit.    PAST MEDICAL HISTORY: Past Medical History:  Diagnosis Date   Atypical mole 10/29/2009   moderate-back   Atypical mole 07/22/2015   moderate- left shoulder   Atypical mole 02/16/2017   moderate-mid back   Atypical mole 12/29/2020   left lower back severe - widershave   Basal cell carcinoma 04/30/2009   right back- (CX35FU)   Basal cell carcinoma 10/20/2016   nod-left forehead (exc)   Basal cell carcinoma 11/18/2016   nod- left forehead (+margin) ([facility])   Cancer ([facility])    Skin, melanoma   back  and basal cell forhead   Chronic renal insufficiency    Complication of anesthesia    BP  flucuates up and down  , dizziness    Depression    Diabetes mellitus without complication ([place])    Dyslipidemia    Headache(784.0)    Migraine as child   History of kidney stones    Hypertension    Melanoma ([place]) 03/14/2012   level III- right upper back (dr Nicole Kindred)   Memory disturbance    Obstructive sleep apnea on CPAP    Prostatitis    Renal cell carcinoma of left kidney ([place])    removed 2007   Rotator cuff tear    Left   Rotator cuff tear    left shoulder    Seizure ([place])  hx since 1996/7 ; per patient , has short bursts of seizures characterized with expressive aphasia, managed by neurologist Dr Jannifer Franklin    PAST SURGICAL HISTORY: Past Surgical History:  Procedure Laterality Date   Arthroscopic surgery     Left knee   INCISIONAL HERNIA REPAIR     Left flank   INGUINAL HERNIA REPAIR Right 05/22/2020   Procedure: OPEN RIGHT INGUINAL HERNIA REPAIR;  Surgeon: Alphonsa Overall, MD;  Location: [facility];  Service: General;  Laterality: Right;   KIDNEY SURGERY Left 2007   Removed due to cancer; per patient " my other kidney is functioning great right now"   Pilonidal cyst resection     SKIN CANCER DESTRUCTION     TOTAL KNEE ARTHROPLASTY Left 07/18/2017   Procedure: LEFT TOTAL KNEE ARTHROPLASTY;  Surgeon: Gaynelle Arabian, MD;  Location: [facility];  Service: Orthopedics;  Laterality: Left;    FAMILY HISTORY: Family History  Problem Relation Age of Onset   Cancer Mother    Heart Problems Mother    Throat cancer Father    Diabetes Sister     SOCIAL HISTORY: Social History   Socioeconomic History   Marital status: Married    Spouse name: Olin Hauser   Number of children: 0   Years of education: HS   Highest education level: Not on file  Occupational History   Occupation: Retired    Fish farm manager: [employer]  Tobacco Use   Smoking status: Never   Smokeless tobacco: Never  Vaping Use   Vaping Use: Never used  Substance and Sexual Activity   Alcohol use: Yes     Alcohol/week: 0.0 - 1.0 standard drinks    Comment: OCC   Drug use: No   Sexual activity: Yes  Other Topics Concern   Not on file  Social History Narrative   Patient lives at home with his wife Olin Hauser). Patient is retired. High school education.   Right handed.   Caffeine- one or two cups daily   Social Determinants of Health   Financial Resource Strain: Not on file  Food Insecurity: Not on file  Transportation Needs: Not on file  Physical Activity: Not on file  Stress: Not on file  Social Connections: Not on file  Intimate Partner Violence: Not on file      PHYSICAL EXAM  Vitals:   06/25/21 1025  BP: 114/72  Pulse: 62  SpO2: 99%  Weight: 192 lb 8 oz (87.3 kg)  Height: '5\' 10"'$  (1.778 m)   Body mass index is 27.62 kg/m.  Generalized: Well developed, in no acute distress  Chest: Lungs clear to auscultation bilaterally  Neurological examination  Mentation: Alert oriented to time, place, history taking. Follows all commands speech and language fluent Cranial nerve II-XII: Extraocular movements were full, visual field were full on confrontational test Head turning and shoulder shrug  were normal and symmetric. Motor: The motor testing reveals 5 over 5 strength of all 4 extremities. Good symmetric motor tone is noted throughout.  Sensory: Sensory testing is intact to soft touch on all 4 extremities. No evidence of extinction is noted.  Gait and station: Gait is normal.    DIAGNOSTIC DATA (LABS, IMAGING, TESTING) - I reviewed patient records, labs, notes, testing and imaging myself where available.  Lab Results  Component Value Date   WBC 6.2 11/06/2020   HGB 14.4 11/06/2020   HCT 43.4 11/06/2020   MCV 94 11/06/2020   PLT 204 11/06/2020      Component Value Date/Time  NA 138 11/06/2020 1022   K 4.8 11/06/2020 1022   CL 104 11/06/2020 1022   CO2 23 11/06/2020 1022   GLUCOSE 91 11/06/2020 1022   GLUCOSE 77 05/14/2020 1450   BUN 26 11/06/2020 1022    CREATININE 1.44 (H) 11/06/2020 1022   CALCIUM 9.7 11/06/2020 1022   PROT 6.9 11/06/2020 1022   ALBUMIN 4.7 11/06/2020 1022   AST 10 11/06/2020 1022   ALT 13 11/06/2020 1022   ALKPHOS 68 11/06/2020 1022   BILITOT 0.2 11/06/2020 1022   GFRNONAA 49 (L) 11/06/2020 1022   GFRAA 56 (L) 11/06/2020 1022   Lab Results  Component Value Date   CHOL  07/20/2007    159        ATP III CLASSIFICATION:  <200     mg/dL   Desirable  200-239  mg/dL   Borderline High  >=240    mg/dL   High   HDL 15 (L) 07/20/2007   LDLCALC  07/20/2007    85        Total Cholesterol/HDL:CHD Risk Coronary Heart Disease Risk Table                     Men   Women  1/2 Average Risk   3.4   3.3   TRIG 295 (H) 07/20/2007   CHOLHDL 10.6 07/20/2007   Lab Results  Component Value Date   HGBA1C 6.4 (H) 05/14/2020   No results found for: [place] PLAN 70 y.o. year old male  has a past medical history of Atypical mole (10/29/2009), Atypical mole (07/22/2015), Atypical mole (02/16/2017), Atypical mole (12/29/2020), Basal cell carcinoma (04/30/2009), Basal cell carcinoma (10/20/2016), Basal cell carcinoma (11/18/2016), Cancer ([place]), Chronic renal insufficiency, Complication of anesthesia, Depression, Diabetes mellitus without complication ([place]), Dyslipidemia, Headache(784.0), History of kidney stones, Hypertension, Melanoma ([place]) (03/14/2012), Memory disturbance, Obstructive sleep apnea on CPAP, Prostatitis, Renal cell carcinoma of left kidney (HCC), Rotator cuff tear, Rotator cuff tear, and Seizure ([place]). here with:  OSA on CPAP  - CPAP compliance excellent - Good treatment of AHI  - Encourage patient to use CPAP nightly and > 4 hours each night - F/U in 1 year or sooner if needed  2.  Seizures  -Continue Lamictal 200 mg twice a day -Lamictal level checked in January in normal range -Recent CBC CMP in care everywhere  3.  Insomnia  -Advised to try taking gabapentin 300 mg at bedtime  routinely to see if this helps with his sleep -Continue clonazepam 0.5-1 milligrams at bedtime as needed for anxiety.  Advised patient to try to take clonazepam only if he has a restless night even after using gabapentin.   Ward Givens, MSN, NP-C 06/25/2021, 10:30 AM [facility] [address] [place], [place] [address] [phone]

## 2021-06-25 NOTE — Patient Instructions (Addendum)
   OSA on CPAP  - CPAP compliance excellent - Good treatment of AHI   2.  Seizures  -Continue Lamictal 200 mg twice a day -Lamictal level checked in January in normal range   3.  Insomnia  -Take gabapentin 300 mg at bedtime -Continue clonazepam 0.5-1 milligrams at bedtime as needed for anxiety

## 2021-07-06 ENCOUNTER — Encounter: Payer: Self-pay | Admitting: Dermatology

## 2021-07-06 ENCOUNTER — Ambulatory Visit (INDEPENDENT_AMBULATORY_CARE_PROVIDER_SITE_OTHER): Payer: Medicare Other | Admitting: Dermatology

## 2021-07-06 ENCOUNTER — Other Ambulatory Visit: Payer: Self-pay

## 2021-07-06 DIAGNOSIS — S30860A Insect bite (nonvenomous) of lower back and pelvis, initial encounter: Secondary | ICD-10-CM

## 2021-07-06 DIAGNOSIS — Z86018 Personal history of other benign neoplasm: Secondary | ICD-10-CM

## 2021-07-06 DIAGNOSIS — L821 Other seborrheic keratosis: Secondary | ICD-10-CM

## 2021-07-06 DIAGNOSIS — W57XXXA Bitten or stung by nonvenomous insect and other nonvenomous arthropods, initial encounter: Secondary | ICD-10-CM

## 2021-07-06 DIAGNOSIS — Z1283 Encounter for screening for malignant neoplasm of skin: Secondary | ICD-10-CM

## 2021-07-06 DIAGNOSIS — D229 Melanocytic nevi, unspecified: Secondary | ICD-10-CM

## 2021-07-06 DIAGNOSIS — Z87898 Personal history of other specified conditions: Secondary | ICD-10-CM

## 2021-07-06 MED ORDER — CLOBETASOL PROP EMOLLIENT BASE 0.05 % EX CREA: 1.0000 "application " | TOPICAL_CREAM | Freq: Two times a day (BID) | CUTANEOUS | 3 refills | Status: AC

## 2021-07-08 DIAGNOSIS — Z125 Encounter for screening for malignant neoplasm of prostate: Secondary | ICD-10-CM | POA: Diagnosis not present

## 2021-07-08 DIAGNOSIS — E7801 Familial hypercholesterolemia: Secondary | ICD-10-CM | POA: Diagnosis not present

## 2021-07-08 DIAGNOSIS — Z0000 Encounter for general adult medical examination without abnormal findings: Secondary | ICD-10-CM | POA: Diagnosis not present

## 2021-07-08 DIAGNOSIS — R739 Hyperglycemia, unspecified: Secondary | ICD-10-CM | POA: Diagnosis not present

## 2021-07-14 DIAGNOSIS — H6690 Otitis media, unspecified, unspecified ear: Secondary | ICD-10-CM | POA: Diagnosis not present

## 2021-07-14 DIAGNOSIS — K5792 Diverticulitis of intestine, part unspecified, without perforation or abscess without bleeding: Secondary | ICD-10-CM | POA: Diagnosis not present

## 2021-07-14 DIAGNOSIS — L400 Psoriasis vulgaris: Secondary | ICD-10-CM | POA: Diagnosis not present

## 2021-07-14 DIAGNOSIS — D8481 Immunodeficiency due to conditions classified elsewhere: Secondary | ICD-10-CM | POA: Diagnosis not present

## 2021-07-14 DIAGNOSIS — K739 Chronic hepatitis, unspecified: Secondary | ICD-10-CM | POA: Diagnosis not present

## 2021-07-15 DIAGNOSIS — N1831 Chronic kidney disease, stage 3a: Secondary | ICD-10-CM | POA: Diagnosis not present

## 2021-07-15 DIAGNOSIS — Z23 Encounter for immunization: Secondary | ICD-10-CM | POA: Diagnosis not present

## 2021-07-15 DIAGNOSIS — I10 Essential (primary) hypertension: Secondary | ICD-10-CM | POA: Diagnosis not present

## 2021-07-15 DIAGNOSIS — M545 Low back pain, unspecified: Secondary | ICD-10-CM | POA: Diagnosis not present

## 2021-07-15 DIAGNOSIS — M19049 Primary osteoarthritis, unspecified hand: Secondary | ICD-10-CM | POA: Diagnosis not present

## 2021-07-15 DIAGNOSIS — Z0000 Encounter for general adult medical examination without abnormal findings: Secondary | ICD-10-CM | POA: Diagnosis not present

## 2021-07-15 DIAGNOSIS — E782 Mixed hyperlipidemia: Secondary | ICD-10-CM | POA: Diagnosis not present

## 2021-07-15 DIAGNOSIS — E1122 Type 2 diabetes mellitus with diabetic chronic kidney disease: Secondary | ICD-10-CM | POA: Diagnosis not present

## 2021-07-17 ENCOUNTER — Encounter: Payer: Self-pay | Admitting: Dermatology

## 2021-07-17 NOTE — Progress Notes (Signed)
   Follow-Up Visit   Subjective  Kenneth Ortega is a 70 y.o. male who presents for the following: Follow-up (Patient here today for follow up from wider shave on left lower back. Per patient his wife did inform him that the area did heal well. Patient states that he as a bug bite on his back x a couple days, per patient the area does itch. ).  Recheck site of dysplastic mole plus several other spots that wife is noticed; recent bite  . Location:  Duration:  Quality:  Associated Signs/Symptoms: Modifying Factors:  Severity:  Timing: Context:   Objective  Well appearing patient in no apparent distress; mood and affect are within normal limits. Full body skin exam.  No current atypical pigmented lesions.  Left Lower Back Follow up wider shave.  No repigmentation  Right Lower Back Edematous inflamed 4 mm papule compatible with bite  Mid Back Several 5 mm flattopped brown papules; typical dermoscopy    A full examination was performed including scalp, head, eyes, ears, nose, lips, neck, chest, axillae, abdomen, back, buttocks, bilateral upper extremities, bilateral lower extremities, hands, feet, fingers, toes, fingernails, and toenails. All findings within normal limits unless otherwise noted below.   Assessment & Plan    History of atypical nevus Left Lower Back  Annual skin examination  Bug bite without infection, initial encounter Right Lower Back  Can apply clobetasol twice daily for 5 days.    Clobetasol Prop Emollient Base (CLOBETASOL PROPIONATE E) 0.05 % emollient cream - Right Lower Back Apply 1 application topically 2 (two) times daily.  Seborrheic keratosis Mid Back  Leave if stable.  Nevus  Yearly skin exams.  Encouraged to self examine with spouse twice annually.      I, Lavonna Monarch, MD, have reviewed all documentation for this visit.  The documentation on 07/17/21 for the exam, diagnosis, procedures, and orders are all accurate and  complete.

## 2021-07-28 ENCOUNTER — Other Ambulatory Visit: Payer: Self-pay | Admitting: Neurology

## 2021-08-18 DIAGNOSIS — H0102B Squamous blepharitis left eye, upper and lower eyelids: Secondary | ICD-10-CM | POA: Diagnosis not present

## 2021-08-18 DIAGNOSIS — H2513 Age-related nuclear cataract, bilateral: Secondary | ICD-10-CM | POA: Diagnosis not present

## 2021-08-18 DIAGNOSIS — H43393 Other vitreous opacities, bilateral: Secondary | ICD-10-CM | POA: Diagnosis not present

## 2021-08-18 DIAGNOSIS — H0102A Squamous blepharitis right eye, upper and lower eyelids: Secondary | ICD-10-CM | POA: Diagnosis not present

## 2021-08-18 DIAGNOSIS — H40033 Anatomical narrow angle, bilateral: Secondary | ICD-10-CM | POA: Diagnosis not present

## 2021-08-18 DIAGNOSIS — H04123 Dry eye syndrome of bilateral lacrimal glands: Secondary | ICD-10-CM | POA: Diagnosis not present

## 2021-08-18 DIAGNOSIS — H02055 Trichiasis without entropian left lower eyelid: Secondary | ICD-10-CM | POA: Diagnosis not present

## 2021-09-17 DIAGNOSIS — Z20828 Contact with and (suspected) exposure to other viral communicable diseases: Secondary | ICD-10-CM | POA: Diagnosis not present

## 2021-09-24 DIAGNOSIS — K635 Polyp of colon: Secondary | ICD-10-CM | POA: Diagnosis not present

## 2021-09-24 DIAGNOSIS — D122 Benign neoplasm of ascending colon: Secondary | ICD-10-CM | POA: Diagnosis not present

## 2021-09-24 DIAGNOSIS — D123 Benign neoplasm of transverse colon: Secondary | ICD-10-CM | POA: Diagnosis not present

## 2021-09-24 DIAGNOSIS — K5730 Diverticulosis of large intestine without perforation or abscess without bleeding: Secondary | ICD-10-CM | POA: Diagnosis not present

## 2021-09-24 DIAGNOSIS — Z86010 Personal history of colonic polyps: Secondary | ICD-10-CM | POA: Diagnosis not present

## 2021-09-24 DIAGNOSIS — Z1211 Encounter for screening for malignant neoplasm of colon: Secondary | ICD-10-CM | POA: Diagnosis not present

## 2021-10-01 DIAGNOSIS — N401 Enlarged prostate with lower urinary tract symptoms: Secondary | ICD-10-CM | POA: Diagnosis not present

## 2021-10-01 DIAGNOSIS — R350 Frequency of micturition: Secondary | ICD-10-CM | POA: Diagnosis not present

## 2021-10-22 ENCOUNTER — Telehealth: Payer: Self-pay | Admitting: Adult Health

## 2021-10-22 NOTE — Telephone Encounter (Signed)
[name], Emanuele Mcwhirter (on [place]) would like a call from the nurse to discuss a sooner appt.  Not able to sleep, not taking his medication correctly.

## 2021-10-22 NOTE — Telephone Encounter (Signed)
LMVM for pts wife that returned call.  I see that he was placed on waitlist for sooner appt.

## 2021-10-27 NOTE — Telephone Encounter (Signed)
Spoke with wife.  Patient has been scheduled for tomorrow morning 10/28/21 830 AM arrival 8 for a sooner follow-up.

## 2021-10-28 ENCOUNTER — Ambulatory Visit (INDEPENDENT_AMBULATORY_CARE_PROVIDER_SITE_OTHER): Payer: Medicare Other | Admitting: Adult Health

## 2021-10-28 ENCOUNTER — Other Ambulatory Visit: Payer: Self-pay

## 2021-10-28 ENCOUNTER — Encounter: Payer: Self-pay | Admitting: Adult Health

## 2021-10-28 VITALS — BP 136/82 | HR 59 | Ht 70.0 in | Wt 198.6 lb

## 2021-10-28 DIAGNOSIS — G4700 Insomnia, unspecified: Secondary | ICD-10-CM | POA: Diagnosis not present

## 2021-10-28 DIAGNOSIS — G4733 Obstructive sleep apnea (adult) (pediatric): Secondary | ICD-10-CM

## 2021-10-28 DIAGNOSIS — R569 Unspecified convulsions: Secondary | ICD-10-CM

## 2021-10-28 DIAGNOSIS — Z9989 Dependence on other enabling machines and devices: Secondary | ICD-10-CM

## 2021-10-28 DIAGNOSIS — G4730 Sleep apnea, unspecified: Secondary | ICD-10-CM | POA: Diagnosis not present

## 2021-10-28 MED ORDER — TRAZODONE HCL 50 MG PO TABS: 50.0000 mg | ORAL_TABLET | Freq: Every day | ORAL | 5 refills | Status: DC

## 2021-10-28 NOTE — Progress Notes (Signed)
PATIENT: Kenneth Ortega DOB: July 30, 1951  REASON FOR VISIT: follow up HISTORY FROM: patient PRIMARY NEUROLOGIST:   HISTORY OF PRESENT ILLNESS: Today 10/28/21:  [name] is a 71 year old male with a history of obstructive sleep apnea insomnia and seizures.  He returns today for follow-up.  He did not bring his CPAP with him.  But reports that he does use it nightly.  Seizures: Stable no seizure events.  Remains on Lamictal 200 mg twice a day  Insomnia: Continues to struggle with insomnia.  States that it can take him 2 to 3 hours to fall asleep.  He has tried Ambien in the past and it worked well but he felt that he got addicted to the medication.  He has been taking gabapentin 300 mg at bedtime.  Recently he is increased to 600 mg at bedtime and taking Benadryl 25 mg.  He reports that sometimes this works but not consistently.  On occasion he will have to take Klonopin.  He does not like taking Klonopin nightly because he wakes up with a headache.  He does not watch television, will get his phone or tablet before bedtime.  06/25/21: [name] is a 71 year old male with a history of obstructive sleep apnea on ASV.  He returns today for follow-up.  He reports that the machine is working well for him.  Denies any issues with machine  Seizures: Currently on Lamictal 200 mg twice a day.  Lamictal level in January was in normal range.  Denies any seizure events.  Insomnia: Continues to struggle with insomnia.  Dr. Jannifer Franklin started the patient on gabapentin after a knee surgery to help with sleep.  Patient reports that he does not take gabapentin nightly.  He reports that he has clonazepam and reports that he also does not take it nightly.  He states that most nights he tries to go to bed without use of medication.  If he wakes up after 1 to 2 hours of sleep he may take gabapentin or clonazepam if he is unable to fall back to sleep.  Patient does not watch television in bed.  Typically goes to bed  around 9-10 PM and wakes up at 7 AM.  Reports that most mornings he wakes up but after he eats breakfast he is sleepy.    HISTORY (Copied from Dr.Dohmeier's note) 68 - year- old  Caucasian male patient seen here after his wife requested a transfer of care to me on 11/06/2020 . He was established with Dr Jannifer Franklin, who is now part-time. He has developed a more severe insomnia problem over the last 12 month . He is often able to go to sleep, but wakes up after only 3 hours of sleep, feeling alert- but then suffers through the day feeling unrested , unrefreshed.   Chief concern according to patient : I have had nasal congestion over 50 years- and have seizures all day- absence spells. He wonders if his seizures are what keeps him from sleeping. Dr Jannifer Franklin had done several EEG s with absence documented. heis first manifestation of a seizure followed insomnia for 3 days- the sleep deprivation put him over the edge.  There was a time he was dependent on AMBIEN and he has weaned off started Prozac, which helped to sleep, too.      I have the pleasure of seeing Kenneth Ortega today, a right-handed White or Caucasian male with OSA and insomnia, nasal deviated septum. He uses nasal mist every night-  He  has a past medical history of Atypical mole (10/29/2009), Atypical mole (07/22/2015), Atypical mole (02/16/2017), Basal cell carcinoma (04/30/2009), Basal cell carcinoma (10/20/2016), Basal cell carcinoma (11/18/2016), Cancer ([place]), Chronic renal insufficiency, Complication of anesthesia, Depression, Diabetes mellitus without complication ([place]), Dyslipidemia, Headache(784.0), History of kidney stones, Hypertension, Melanoma ([place]) (03/14/2012), Memory disturbance, Obstructive sleep apnea on CPAP, Prostatitis, Renal cell carcinoma of left kidney ([place]), monokidney- last GFR 09-2020 [facility] was 52/h,  and absence Seizure ([place]).   The patient had the first sleep study in the year 4/ 2017, with a result of an AHI ( Apnea  Hypopnea index)  of 21/h. He did not come back for an ordered CPAP titration in 2020.  [name] has been very compliant use of his CPAP under percent and times a day for 9 hours and 48 minutes on average use of CPAP is set to 10 cm 12 cm of water was 3 cm expiratory pressure relief his residual AHI is 2.8 which is excellent he does have a moderate to high air leak at the 95th percentile of 28.6 L/min.  It does not seem to bother him that much.  His mask is in nasal mask he is using a Psychologist, clinical ESON, which is a nasal mask.      REVIEW OF SYSTEMS: Out of a complete 14 system review of symptoms, the patient complains only of the following symptoms, and all other reviewed systems are negative.  See HPI   ALLERGIES: Allergies  Allergen Reactions   Tylox [Oxycodone-Acetaminophen] Other (See Comments)    Hallucinations/insomnia . Does not tolerate any of the "oxys". Prefers hydrocodone   Keppra [Levetiracetam] Other (See Comments)    Patient unsure of reaction   Oxycontin [Oxycodone] Other (See Comments)    hallucinations     HOME MEDICATIONS: Outpatient Medications Prior to Visit  Medication Sig Dispense Refill   BENICAR 5 MG tablet Take 5 mg by mouth at bedtime.      Clobetasol Prop Emollient Base (CLOBETASOL PROPIONATE E) 0.05 % emollient cream Apply 1 application topically 2 (two) times daily. 60 g 3   clonazePAM (KLONOPIN) 0.5 MG tablet Take 0.5-1 tablets (0.25-0.5 mg total) by mouth at bedtime as needed for anxiety. For REM BD - use as needed. 90 tablet 0   dapagliflozin propanediol (FARXIGA) 5 MG TABS tablet Take 5 mg by mouth daily.      FLUoxetine (PROZAC) 20 MG capsule Take 20 mg by mouth daily.     gabapentin (NEURONTIN) 300 MG capsule Take 1 capsule (300 mg total) by mouth at bedtime. 90 capsule 3   lamoTRIgine (LAMICTAL) 200 MG tablet Take 1 tablet (200 mg total) by mouth 2 (two) times daily. 180 tablet 4   simvastatin (ZOCOR) 10 MG tablet Take 10 mg by mouth at  bedtime.      simvastatin (ZOCOR) 20 MG tablet Take 20 mg by mouth daily.      tamsulosin (FLOMAX) 0.4 MG CAPS capsule Take 0.4 mg by mouth daily.     No facility-administered medications prior to visit.    PAST MEDICAL HISTORY: Past Medical History:  Diagnosis Date   Atypical mole 10/29/2009   moderate-back   Atypical mole 07/22/2015   moderate- left shoulder   Atypical mole 02/16/2017   moderate-mid back   Atypical mole 12/29/2020   left lower back severe - widershave   Basal cell carcinoma 04/30/2009   right back- (CX35FU)   Basal cell carcinoma 10/20/2016   nod-left forehead (exc)  Basal cell carcinoma 11/18/2016   nod- left forehead (+margin) ([facility])   Cancer ([facility]CC)    Skin, melanoma   back  and basal cell forhead   Chronic renal insufficiency    Complication of anesthesia    BP flucuates up and down  , dizziness    Depression    Diabetes mellitus without complication ([place])    Dyslipidemia    Headache(784.0)    Migraine as child   History of kidney stones    Hypertension    Melanoma ([place]) 03/14/2012   level III- right upper back (dr Nicole Kindred)   Memory disturbance    Obstructive sleep apnea on CPAP    Prostatitis    Renal cell carcinoma of left kidney ([place])    removed 2007   Rotator cuff tear    Left   Rotator cuff tear    left shoulder    Seizure ([place])    hx since 1996/7 ; per patient , has short bursts of seizures characterized with expressive aphasia, managed by neurologist Dr Jannifer Franklin    PAST SURGICAL HISTORY: Past Surgical History:  Procedure Laterality Date   Arthroscopic surgery     Left knee   INCISIONAL HERNIA REPAIR     Left flank   INGUINAL HERNIA REPAIR Right 05/22/2020   Procedure: OPEN RIGHT INGUINAL HERNIA REPAIR;  Surgeon: Alphonsa Overall, MD;  Location: [facility];  Service: General;  Laterality: Right;   KIDNEY SURGERY Left 2007   Removed due to cancer; per patient " my other kidney is functioning great right now"   Pilonidal cyst resection      SKIN CANCER DESTRUCTION     TOTAL KNEE ARTHROPLASTY Left 07/18/2017   Procedure: LEFT TOTAL KNEE ARTHROPLASTY;  Surgeon: Gaynelle Arabian, MD;  Location: [facility];  Service: Orthopedics;  Laterality: Left;    FAMILY HISTORY: Family History  Problem Relation Age of Onset   Cancer Mother    Heart Problems Mother    Throat cancer Father    Diabetes Sister    Sleep apnea Sister     SOCIAL HISTORY: Social History   Socioeconomic History   Marital status: Married    Spouse name: Olin Hauser   Number of children: 0   Years of education: HS   Highest education level: Not on file  Occupational History   Occupation: Retired    Fish farm manager: [employer]  Tobacco Use   Smoking status: Never   Smokeless tobacco: Never  Vaping Use   Vaping Use: Never used  Substance and Sexual Activity   Alcohol use: Yes    Alcohol/week: 0.0 - 1.0 standard drinks    Comment: OCC   Drug use: No   Sexual activity: Yes  Other Topics Concern   Not on file  Social History Narrative   Patient lives at home with his wife Olin Hauser). Patient is retired. High school education.   Right handed.   Caffeine- one or two cups daily   Social Determinants of Health   Financial Resource Strain: Not on file  Food Insecurity: Not on file  Transportation Needs: Not on file  Physical Activity: Not on file  Stress: Not on file  Social Connections: Not on file  Intimate Partner Violence: Not on file      PHYSICAL EXAM  Vitals:   10/28/21 0811  BP: 136/82  Pulse: (!) 59  Weight: 198 lb 9.6 oz (90.1 kg)  Height: 5\' 10"  (1.778 m)   Body mass index is 28.5 kg/m.  Generalized: Well developed,  in no acute distress    Neurological examination  Mentation: Alert oriented to time, place, history taking. Follows all commands speech and language fluent Cranial nerve II-XII: Extraocular movements were full, visual field were full on confrontational test Head turning and shoulder shrug  were normal and  symmetric. Motor: The motor testing reveals 5 over 5 strength of all 4 extremities. Good symmetric motor tone is noted throughout.  Sensory: Sensory testing is intact to soft touch on all 4 extremities. No evidence of extinction is noted.  Gait and station: Gait is normal.    DIAGNOSTIC DATA (LABS, IMAGING, TESTING) - I reviewed patient records, labs, notes, testing and imaging myself where available.  Lab Results  Component Value Date   WBC 6.2 11/06/2020   HGB 14.4 11/06/2020   HCT 43.4 11/06/2020   MCV 94 11/06/2020   PLT 204 11/06/2020      Component Value Date/Time   NA 138 11/06/2020 1022   K 4.8 11/06/2020 1022   CL 104 11/06/2020 1022   CO2 23 11/06/2020 1022   GLUCOSE 91 11/06/2020 1022   GLUCOSE 77 05/14/2020 1450   BUN 26 11/06/2020 1022   CREATININE 1.44 (H) 11/06/2020 1022   CALCIUM 9.7 11/06/2020 1022   PROT 6.9 11/06/2020 1022   ALBUMIN 4.7 11/06/2020 1022   AST 10 11/06/2020 1022   ALT 13 11/06/2020 1022   ALKPHOS 68 11/06/2020 1022   BILITOT 0.2 11/06/2020 1022   GFRNONAA 49 (L) 11/06/2020 1022   GFRAA 56 (L) 11/06/2020 1022   Lab Results  Component Value Date   CHOL  07/20/2007    159        ATP III CLASSIFICATION:  <200     mg/dL   Desirable  200-239  mg/dL   Borderline High  >=240    mg/dL   High   HDL 15 (L) 07/20/2007   LDLCALC  07/20/2007    85        Total Cholesterol/HDL:CHD Risk Coronary Heart Disease Risk Table                     Men   Women  1/2 Average Risk   3.4   3.3   TRIG 295 (H) 07/20/2007   CHOLHDL 10.6 07/20/2007   Lab Results  Component Value Date   HGBA1C 6.4 (H) 05/14/2020   No results found for: [place] PL[place] 71 y.o. year old male  has a past medical history of Atypical mole (10/29/2009), Atypical mole (07/22/2015), Atypical mole (02/16/2017), Atypical mole (12/29/2020), Basal cell carcinoma (04/30/2009), Basal cell carcinoma (10/20/2016), Basal cell carcinoma (11/18/2016), Cancer ([place]),  Chronic renal insufficiency, Complication of anesthesia, Depression, Diabetes mellitus without complication ([place]), Dyslipidemia, Headache(784.0), History of kidney stones, Hypertension, Melanoma ([place]) (03/14/2012), Memory disturbance, Obstructive sleep apnea on CPAP, Prostatitis, Renal cell carcinoma of left kidney (HCC), Rotator cuff tear, Rotator cuff tear, and Seizure ([place]). here with:  Insomnia   Start trazodone 50 mg at bedtime I reviewed the medication with the patient and provided him with a handout.  Did advise that this was a low dose and if not beneficial after 1 week we can increase to 75 mg Advised patient to refrain from using gabapentin or Klonopin at this time Patient is currently weaning off of Prozac.  Reviewed serotonin syndrome with the patient although would be very unlikely  2.  Seizures  Continue Lamictal 200 mg twice a day Stable no seizure events  Follow-up  in 6 months or sooner if needed   Ward Givens, MSN, NP-C 10/28/2021, 8:41 AM [facility] [address], [place], [place] [address] [phone]

## 2021-10-28 NOTE — Patient Instructions (Signed)
Your Plan:  Continue to wean off Prozac Start Trazodone 50 mg at bedtime Try not to use gabapentin and klonopin If your symptoms worsen or you develop new symptoms please let us know.       Thank you for coming to see [place] at [facility]. I hope we have been able to provide you high quality care today.  You may receive a patient satisfaction survey over the next few weeks. We would appreciate your feedback and comments so that we may continue to improve ourselves and the health of our patients.

## 2021-11-04 ENCOUNTER — Telehealth: Payer: Self-pay | Admitting: *Deleted

## 2021-11-04 DIAGNOSIS — N1831 Chronic kidney disease, stage 3a: Secondary | ICD-10-CM | POA: Diagnosis not present

## 2021-11-04 DIAGNOSIS — E782 Mixed hyperlipidemia: Secondary | ICD-10-CM | POA: Diagnosis not present

## 2021-11-04 DIAGNOSIS — I10 Essential (primary) hypertension: Secondary | ICD-10-CM | POA: Diagnosis not present

## 2021-11-04 DIAGNOSIS — E1122 Type 2 diabetes mellitus with diabetic chronic kidney disease: Secondary | ICD-10-CM | POA: Diagnosis not present

## 2021-11-04 NOTE — Telephone Encounter (Signed)
DL is excellent! Please make patient aware

## 2021-11-04 NOTE — Telephone Encounter (Signed)
Spoke with pt's wife Olin Hauser, and advised pt's DL is excellent, keep up the good work. Will see him in July. She was very appreciative for the call.

## 2021-11-12 DIAGNOSIS — I10 Essential (primary) hypertension: Secondary | ICD-10-CM | POA: Diagnosis not present

## 2021-11-12 DIAGNOSIS — E7800 Pure hypercholesterolemia, unspecified: Secondary | ICD-10-CM | POA: Diagnosis not present

## 2021-11-12 DIAGNOSIS — N1831 Chronic kidney disease, stage 3a: Secondary | ICD-10-CM | POA: Diagnosis not present

## 2021-11-12 DIAGNOSIS — G629 Polyneuropathy, unspecified: Secondary | ICD-10-CM | POA: Diagnosis not present

## 2021-11-12 DIAGNOSIS — E1122 Type 2 diabetes mellitus with diabetic chronic kidney disease: Secondary | ICD-10-CM | POA: Diagnosis not present

## 2021-12-04 DIAGNOSIS — Z20822 Contact with and (suspected) exposure to covid-19: Secondary | ICD-10-CM | POA: Diagnosis not present

## 2021-12-24 ENCOUNTER — Ambulatory Visit: Payer: Medicare Other | Admitting: Adult Health

## 2022-02-08 DIAGNOSIS — Z20822 Contact with and (suspected) exposure to covid-19: Secondary | ICD-10-CM | POA: Diagnosis not present

## 2022-02-24 DIAGNOSIS — R051 Acute cough: Secondary | ICD-10-CM | POA: Diagnosis not present

## 2022-02-24 DIAGNOSIS — R059 Cough, unspecified: Secondary | ICD-10-CM | POA: Diagnosis not present

## 2022-02-24 DIAGNOSIS — Z20822 Contact with and (suspected) exposure to covid-19: Secondary | ICD-10-CM | POA: Diagnosis not present

## 2022-03-12 DIAGNOSIS — E1122 Type 2 diabetes mellitus with diabetic chronic kidney disease: Secondary | ICD-10-CM | POA: Diagnosis not present

## 2022-03-19 DIAGNOSIS — E1122 Type 2 diabetes mellitus with diabetic chronic kidney disease: Secondary | ICD-10-CM | POA: Diagnosis not present

## 2022-03-19 DIAGNOSIS — N1831 Chronic kidney disease, stage 3a: Secondary | ICD-10-CM | POA: Diagnosis not present

## 2022-03-19 DIAGNOSIS — E782 Mixed hyperlipidemia: Secondary | ICD-10-CM | POA: Diagnosis not present

## 2022-03-19 DIAGNOSIS — I10 Essential (primary) hypertension: Secondary | ICD-10-CM | POA: Diagnosis not present

## 2022-03-19 DIAGNOSIS — K219 Gastro-esophageal reflux disease without esophagitis: Secondary | ICD-10-CM | POA: Diagnosis not present

## 2022-04-02 DIAGNOSIS — R1013 Epigastric pain: Secondary | ICD-10-CM | POA: Diagnosis not present

## 2022-04-15 ENCOUNTER — Telehealth: Payer: Self-pay | Admitting: Adult Health

## 2022-04-15 NOTE — Telephone Encounter (Signed)
I called and he was unsure of who to call.  I relayed that we donot prescribe the prozac, and did relay the message about wean, and he states he is not weaning off, he had been on for years.  He will call pcp about his concern, he has a lot more anxiety/stressors (house remodel, wife pain, due to have hip surgery).  He appreciated call back.

## 2022-04-15 NOTE — Telephone Encounter (Signed)
We dont prescribe, but last visit, mentions pt was weaning off prozac.  MM/NP said to continue weaning, potential for serotonin syndrome.  Ofv note states: Patient is currently weaning off of Prozac.  Reviewed serotonin syndrome with the patient although would be very unlikely.

## 2022-04-15 NOTE — Telephone Encounter (Signed)
Pt has concerns and feels that he may need a re evaluation of his  FLUoxetine (PROZAC) 20 MG capsule, pt states he is snapping at his wife and he feels bad about it, please call.

## 2022-04-27 ENCOUNTER — Other Ambulatory Visit: Payer: Self-pay | Admitting: Neurology

## 2022-04-28 NOTE — Progress Notes (Unsigned)
PATIENT: Kenneth Ortega DOB: 1951/03/08  REASON FOR VISIT: follow up HISTORY FROM: patient PRIMARY NEUROLOGIST: Dr. Vickey Huger  Chief Complaint  Patient presents with   Follow-up    Pt in 7  pt here for CPAP follow up pt states no questions or concerns for this visit      HISTORY OF PRESENT ILLNESS: Today 04/29/22:  Kenneth Ortega is a 71 year old male with a history of obstructive sleep apnea on CPAP and seizures.  He returns today for follow-up.  Reports that the CPAP is working well.  His download is below   Seizures:  Continue on Lamictal 200 mg BID.  He reports that he "has seizures all the time."  When asked to further explain he states that when he had an EEG in the past he was told by Dr. Anne Hahn that he has seizures all the time although he may not realize he is having them.  He does report that he has not had any known seizure events that he knows of or his wife has witnessed.  Patient does not have an EEG in our system.    10/27/2021 Kenneth Ortega is a 71 year old male with a history of obstructive sleep /apnea insomnia and seizures.  He returns today for follow-up.  He did not bring his CPAP with him.  But reports that he does use it nightly.  Seizures: Stable no seizure events.  Remains on Lamictal 200 mg twice a day  Insomnia: Continues to struggle with insomnia.  States that it can take him 2 to 3 hours to fall asleep.  He has tried Ambien in the past and it worked well but he felt that he got addicted to the medication.  He has been taking gabapentin 300 mg at bedtime.  Recently he is increased to 600 mg at bedtime and taking Benadryl 2/5 mg.  He reports that sometimes this works but not consistently.  On occasion he will have to take Klonopin.  He does not like taking Klonopin nightly because he wakes up with a headache.  He does not watch television, will get his phone or tablet before bedtime.  06/25/21: Kenneth Ortega is a 71 year old male with a history of obstructive sleep  apnea on ASV.  He returns today for follow-up.  He reports that the machine is working well for him.  Denies any issues with machine  Seizures: Currently on Lamictal 200 mg twice a day.  Lamictal level in January was in normal range.  Denies any seizure events.  Insomnia: Continues to struggle with insomnia.  Dr. Anne Hahn started the patient on gabapentin after a knee surgery to help with sleep.  Patient reports that he does not take gabapentin nightly.  He reports that he has clonazepam and reports that he also does not take it nightly.  He states that most nights he tries to go to bed without use of medication.  If he wakes up after 1 to 2 hours of sleep he may take gabapentin or clonazepam if he is unable to fall back to sleep.  Patient does not watch television in bed.  Typically goes to bed around 9-10 PM and wakes up at 7 AM.  Reports that most mornings he wakes up but after he eats breakfast he is sleepy.    HISTORY (Copied from Dr.Dohmeier's note) 53 - year- old  Caucasian male patient seen here after his wife requested a transfer of care to me on 11/06/2020 . He was established with Dr Anne Hahn,  who is now part-time. He has developed a more severe insomnia problem over the last 12 month . He is often able to go to sleep, but wakes up after only 3 hours of sleep, feeling alert- but then suffers through the day feeling unrested , unrefreshed.   Chief concern according to patient : I have had nasal congestion over 50 years- and have seizures all day- absence spells. He wonders if his seizures are what keeps him from sleeping. Dr Anne Hahn had done several EEG s with absence documented. heis first manifestation of a seizure followed insomnia for 3 days- the sleep deprivation put him over the edge.  There was a time he was dependent on AMBIEN and he has weaned off started Prozac, which helped to sleep, too.      I have the pleasure of seeing Kenneth Ortega today, a right-handed White or Caucasian male  with OSA and insomnia, nasal deviated septum. He uses nasal mist every night-  He  has a past medical history of Atypical mole (10/29/2009), Atypical mole (07/22/2015), Atypical mole (02/16/2017), Basal cell carcinoma (04/30/2009), Basal cell carcinoma (10/20/2016), Basal cell carcinoma (11/18/2016), Cancer (HCC), Chronic renal insufficiency, Complication of anesthesia, Depression, Diabetes mellitus without complication (HCC), Dyslipidemia, Headache(784.0), History of kidney stones, Hypertension, Melanoma (HCC) (03/14/2012), Memory disturbance, Obstructive sleep apnea on CPAP, Prostatitis, Renal cell carcinoma of left kidney (HCC), monokidney- last GFR 09-2020 [facility] was 52/h,  and absence Seizure (HCC).   The patient had the first sleep study in the year 4/ 2017, with a result of an AHI ( Apnea Hypopnea index)  of 21/h. He did not come back for an ordered CPAP titration in 2020.  Kenneth Ortega has been very compliant use of his CPAP under percent and times a day for 9 hours and 48 minutes on average use of CPAP is set to 10 cm 12 cm of water was 3 cm expiratory pressure relief his residual AHI is 2.8 which is excellent he does have a moderate to high air leak at the 95th percentile of 28.6 L/min.  It does not seem to bother him that much.  His mask is in nasal mask he is using a [employer] ESON, which is a nasal mask.      REVIEW OF SYSTEMS: Out of a complete 14 system review of symptoms, the patient complains only of the following symptoms, and all other reviewed systems are negative.  See HPI   ALLERGIES: Allergies  Allergen Reactions   Tylox [Oxycodone-Acetaminophen] Other (See Comments)    Hallucinations/insomnia . Does not tolerate any of the "oxys". Prefers hydrocodone   Keppra [Levetiracetam] Other (See Comments)    Patient unsure of reaction   Oxycontin [Oxycodone] Other (See Comments)    hallucinations     HOME MEDICATIONS: Outpatient Medications Prior to Visit  Medication  Sig Dispense Refill   BENICAR 5 MG tablet Take 5 mg by mouth at bedtime.      clonazePAM (KLONOPIN) 0.5 MG tablet Take 0.5-1 tablets (0.25-0.5 mg total) by mouth at bedtime as needed for anxiety. For REM BD - use as needed. 90 tablet 0   dapagliflozin propanediol (FARXIGA) 5 MG TABS tablet Take 5 mg by mouth daily.      FLUoxetine (PROZAC) 20 MG capsule Take 20 mg by mouth daily.     gabapentin (NEURONTIN) 300 MG capsule Take 1 capsule (300 mg total) by mouth at bedtime. 90 capsule 3   lamoTRIgine (LAMICTAL) 200 MG tablet TAKE 1 TABLET  TWICE A DAY 180 tablet 1   simvastatin (ZOCOR) 10 MG tablet Take 10 mg by mouth at bedtime.      simvastatin (ZOCOR) 20 MG tablet Take 20 mg by mouth daily.      tamsulosin (FLOMAX) 0.4 MG CAPS capsule Take 0.4 mg by mouth daily.     Clobetasol Prop Emollient Base (CLOBETASOL PROPIONATE E) 0.05 % emollient cream Apply 1 application topically 2 (two) times daily. (Patient not taking: Reported on 04/29/2022) 60 g 3   traZODone (DESYREL) 50 MG tablet Take 1 tablet (50 mg total) by mouth at bedtime. 30 tablet 5   No facility-administered medications prior to visit.    PAST MEDICAL HISTORY: Past Medical History:  Diagnosis Date   Atypical mole 10/29/2009   moderate-back   Atypical mole 07/22/2015   moderate- left shoulder   Atypical mole 02/16/2017   moderate-mid back   Atypical mole 12/29/2020   left lower back severe - widershave   Basal cell carcinoma 04/30/2009   right back- (CX35FU)   Basal cell carcinoma 10/20/2016   nod-left forehead (exc)   Basal cell carcinoma 11/18/2016   nod- left forehead (+margin) (MOHS)   Cancer (HCC)    Skin, melanoma   back  and basal cell forhead   Chronic renal insufficiency    Complication of anesthesia    BP flucuates up and down  , dizziness    Depression    Diabetes mellitus without complication (HCC)    Dyslipidemia    Headache(784.0)    Migraine as child   History of kidney stones    Hypertension     Melanoma (HCC) 03/14/2012   level III- right upper back (dr Roseanne Reno)   Memory disturbance    Obstructive sleep apnea on CPAP    Prostatitis    Renal cell carcinoma of left kidney (HCC)    removed 2007   Rotator cuff tear    Left   Rotator cuff tear    left shoulder    Seizure (HCC)    hx since 1996/7 ; per patient , has short bursts of seizures characterized with expressive aphasia, managed by neurologist Dr Anne Hahn    PAST SURGICAL HISTORY: Past Surgical History:  Procedure Laterality Date   Arthroscopic surgery     Left knee   INCISIONAL HERNIA REPAIR     Left flank   INGUINAL HERNIA REPAIR Right 05/22/2020   Procedure: OPEN RIGHT INGUINAL HERNIA REPAIR;  Surgeon: Ovidio Kin, MD;  Location: [facility];  Service: General;  Laterality: Right;   KIDNEY SURGERY Left 2007   Removed due to cancer; per patient " my other kidney is functioning great right now"   Pilonidal cyst resection     SKIN CANCER DESTRUCTION     TOTAL KNEE ARTHROPLASTY Left 07/18/2017   Procedure: LEFT TOTAL KNEE ARTHROPLASTY;  Surgeon: Ollen Gross, MD;  Location: [facility];  Service: Orthopedics;  Laterality: Left;    FAMILY HISTORY: Family History  Problem Relation Age of Onset   Cancer Mother    Heart Problems Mother    Throat cancer Father    Diabetes Sister    Sleep apnea Sister     SOCIAL HISTORY: Social History   Socioeconomic History   Marital status: Married    Spouse name: Rinaldo Cloud   Number of children: 0   Years of education: HS   Highest education level: Not on file  Occupational History   Occupation: Retired    Associate Professor: [employer]  Tobacco Use  Smoking status: Never   Smokeless tobacco: Never  Vaping Use   Vaping Use: Never used  Substance and Sexual Activity   Alcohol use: Yes    Alcohol/week: 0.0 - 1.0 standard drinks of alcohol    Comment: OCC   Drug use: No   Sexual activity: Yes  Other Topics Concern   Not on file  Social History Narrative   Patient lives at  home with his wife Rinaldo Cloud). Patient is retired. High school education.   Right handed.   Caffeine- one or two cups daily   Social Determinants of Health   Financial Resource Strain: Not on file  Food Insecurity: Not on file  Transportation Needs: Not on file  Physical Activity: Not on file  Stress: Not on file  Social Connections: Not on file  Intimate Partner Violence: Not on file      PHYSICAL EXAM  Vitals:   04/29/22 0948  BP: 130/70  Pulse: (!) 57  Weight: 196 lb 6.4 oz (89.1 kg)  Height: 5\' 10"  (1.778 m)   Body mass index is 28.18 kg/m. Generalized: Well developed, in no acute distress    Neurological examination  Mentation: Alert oriented to time, place, history taking. Follows all commands speech and language fluent Cranial nerve II-XII: Extraocular movements were full, visual field were full on confrontational test Head turning and shoulder shrug  were normal and symmetric. Motor: The motor testing reveals 5 over 5 strength of all 4 extremities. Good symmetric motor tone is noted throughout.  Sensory: Sensory testing is intact to soft touch on all 4 extremities. No evidence of extinction is noted.  Gait and station: Gait is normal.    DIAGNOSTIC DATA (LABS, IMAGING, TESTING) - I reviewed patient records, labs, notes, testing and imaging myself where available.  Lab Results  Component Value Date   WBC 6.2 11/06/2020   HGB 14.4 11/06/2020   HCT 43.4 11/06/2020   MCV 94 11/06/2020   PLT 204 11/06/2020      Component Value Date/Time   NA 138 11/06/2020 1022   K 4.8 11/06/2020 1022   CL 104 11/06/2020 1022   CO2 23 11/06/2020 1022   GLUCOSE 91 11/06/2020 1022   GLUCOSE 77 05/14/2020 1450   BUN 26 11/06/2020 1022   CREATININE 1.44 (H) 11/06/2020 1022   CALCIUM 9.7 11/06/2020 1022   PROT 6.9 11/06/2020 1022   ALBUMIN 4.7 11/06/2020 1022   AST 10 11/06/2020 1022   ALT 13 11/06/2020 1022   ALKPHOS 68 11/06/2020 1022   BILITOT 0.2 11/06/2020 1022    GFRNONAA 49 (L) 11/06/2020 1022   GFRAA 56 (L) 11/06/2020 1022   Lab Results  Component Value Date   CHOL  07/20/2007    159        ATP III CLASSIFICATION:  <200     mg/dL   Desirable  161-096  mg/dL   Borderline High  >=045    mg/dL   High   HDL 15 (L) 40/98/1191   LDLCALC  07/20/2007    85        Total Cholesterol/HDL:CHD Risk Coronary Heart Disease Risk Table                     Men   Women  1/2 Average Risk   3.4   3.3   TRIG 295 (H) 07/20/2007   CHOLHDL 10.6 07/20/2007   Lab Results  Component Value Date   HGBA1C 6.4 (H) 05/14/2020   No results found  for: "VITAMINB12"     ASSESSMENT AND PLAN 71 y.o. year old male  has a past medical history of Atypical mole (10/29/2009), Atypical mole (07/22/2015), Atypical mole (02/16/2017), Atypical mole (12/29/2020), Basal cell carcinoma (04/30/2009), Basal cell carcinoma (10/20/2016), Basal cell carcinoma (11/18/2016), Cancer (HCC), Chronic renal insufficiency, Complication of anesthesia, Depression, Diabetes mellitus without complication (HCC), Dyslipidemia, Headache(784.0), History of kidney stones, Hypertension, Melanoma (HCC) (03/14/2012), Memory disturbance, Obstructive sleep apnea on CPAP, Prostatitis, Renal cell carcinoma of left kidney (HCC), Rotator cuff tear, Rotator cuff tear, and Seizure (HCC). here with:  OSA on CPAP  Good compliance Good treatment of apnea Encouraged the patient to use CPAP nightly greater than 4 hours each night  2.  Seizures  Continue Lamictal 200 mg twice a day Blood work today Repeat EEG  Follow-up in 6 months or sooner if needed   Butch Penny, MSN, NP-C 04/29/2022, 10:16 AM [facility] [address], [address] [place], [place] [address] [phone]

## 2022-04-29 ENCOUNTER — Ambulatory Visit (INDEPENDENT_AMBULATORY_CARE_PROVIDER_SITE_OTHER): Payer: Medicare Other | Admitting: Adult Health

## 2022-04-29 ENCOUNTER — Encounter: Payer: Self-pay | Admitting: Adult Health

## 2022-04-29 VITALS — BP 130/70 | HR 57 | Ht 70.0 in | Wt 196.4 lb

## 2022-04-29 DIAGNOSIS — G4733 Obstructive sleep apnea (adult) (pediatric): Secondary | ICD-10-CM | POA: Diagnosis not present

## 2022-04-29 DIAGNOSIS — R569 Unspecified convulsions: Secondary | ICD-10-CM

## 2022-04-29 DIAGNOSIS — Z9989 Dependence on other enabling machines and devices: Secondary | ICD-10-CM | POA: Diagnosis not present

## 2022-04-29 DIAGNOSIS — Z5181 Encounter for therapeutic drug level monitoring: Secondary | ICD-10-CM

## 2022-05-01 LAB — CBC WITH DIFFERENTIAL/PLATELET
Basophils Absolute: 0 10*3/uL (ref 0.0–0.2)
Basos: 1 %
EOS (ABSOLUTE): 0.1 10*3/uL (ref 0.0–0.4)
Eos: 2 %
Hematocrit: 43.3 % (ref 37.5–51.0)
Hemoglobin: 14.2 g/dL (ref 13.0–17.7)
Immature Grans (Abs): 0.1 10*3/uL (ref 0.0–0.1)
Immature Granulocytes: 1 %
Lymphocytes Absolute: 1.4 10*3/uL (ref 0.7–3.1)
Lymphs: 26 %
MCH: 30.5 pg (ref 26.6–33.0)
MCHC: 32.8 g/dL (ref 31.5–35.7)
MCV: 93 fL (ref 79–97)
Monocytes Absolute: 0.4 10*3/uL (ref 0.1–0.9)
Monocytes: 8 %
Neutrophils Absolute: 3.3 10*3/uL (ref 1.4–7.0)
Neutrophils: 62 %
Platelets: 181 10*3/uL (ref 150–450)
RBC: 4.65 x10E6/uL (ref 4.14–5.80)
RDW: 12 % (ref 11.6–15.4)
WBC: 5.3 10*3/uL (ref 3.4–10.8)

## 2022-05-01 LAB — COMPREHENSIVE METABOLIC PANEL
ALT: 19 IU/L (ref 0–44)
AST: 16 IU/L (ref 0–40)
Albumin/Globulin Ratio: 2.2 (ref 1.2–2.2)
Albumin: 4.7 g/dL (ref 3.7–4.7)
Alkaline Phosphatase: 64 IU/L (ref 44–121)
BUN/Creatinine Ratio: 16 (ref 10–24)
BUN: 24 mg/dL (ref 8–27)
Bilirubin Total: 0.3 mg/dL (ref 0.0–1.2)
CO2: 24 mmol/L (ref 20–29)
Calcium: 9.4 mg/dL (ref 8.6–10.2)
Chloride: 103 mmol/L (ref 96–106)
Creatinine, Ser: 1.48 mg/dL — ABNORMAL HIGH (ref 0.76–1.27)
Globulin, Total: 2.1 g/dL (ref 1.5–4.5)
Glucose: 114 mg/dL — ABNORMAL HIGH (ref 70–99)
Potassium: 5 mmol/L (ref 3.5–5.2)
Sodium: 141 mmol/L (ref 134–144)
Total Protein: 6.8 g/dL (ref 6.0–8.5)
eGFR: 50 mL/min/{1.73_m2} — ABNORMAL LOW (ref >59)

## 2022-05-01 LAB — LAMOTRIGINE LEVEL: Lamotrigine Lvl: 9.3 ug/mL (ref 2.0–20.0)

## 2022-06-04 DIAGNOSIS — N183 Chronic kidney disease, stage 3 unspecified: Secondary | ICD-10-CM | POA: Diagnosis not present

## 2022-07-01 ENCOUNTER — Ambulatory Visit (INDEPENDENT_AMBULATORY_CARE_PROVIDER_SITE_OTHER): Payer: Medicare Other | Admitting: Neurology

## 2022-07-01 DIAGNOSIS — R569 Unspecified convulsions: Secondary | ICD-10-CM

## 2022-07-05 ENCOUNTER — Ambulatory Visit: Payer: Medicare Other | Admitting: Dermatology

## 2022-07-06 NOTE — Procedures (Signed)
    History:  71 year old mand with seizure disorder   EEG classification: Awake and drowsy  Description of the recording: The background rhythms of this recording consists of a fairly well modulated medium amplitude alpha rhythm of 10 Hz that is reactive to eye opening and closure. As the record progresses, the patient appears to remain in the waking state throughout the recording. Photic stimulation was performed, did show abnormalities such as generalized spike and wave discharges. Hyperventilation was also performed, did not show any abnormalities. Toward the end of the recording, the patient enters the drowsy state with slight symmetric slowing seen. The patient never enters stage II sleep. There is presence of generalized spikes and slow wave discharge up to '3Hz'$ , some of them are run up to 4 seconds. There was no focal slowing. EKG monitor shows no evidence of cardiac rhythm abnormalities with a heart rate of 55.  Abnormality: Frequent generalized spike and slow wave discharges, up to '3Hz'$ , some of the discharges are in run of up to 4 seconds.   Impression: This is an abnormal EEG recording in the waking and drowsy state due to generalized epileptiform discharges which are consistent with generalized epilepsy.    Alric Ran, MD [facility]

## 2022-07-13 ENCOUNTER — Telehealth: Payer: Self-pay | Admitting: *Deleted

## 2022-07-13 NOTE — Telephone Encounter (Signed)
-----   Message from Ward Givens, NP sent at 07/13/2022  2:30 PM EDT ----- EEG shows epileptiform discharges. Will not adjust medication unless he has known seizure events. Please make sure next appt is with Dr. Brett Fairy

## 2022-07-13 NOTE — Telephone Encounter (Signed)
I relayed the results of the EEG to [facility], his wife (on [place]) as per below.  No change in medications.  Changed appt time from 1300 MM/NP  to 1430 with Dr. Brett Fairy. She will relay to pt. That was fine per wife.

## 2022-07-16 DIAGNOSIS — Z23 Encounter for immunization: Secondary | ICD-10-CM | POA: Diagnosis not present

## 2022-07-16 DIAGNOSIS — Z0000 Encounter for general adult medical examination without abnormal findings: Secondary | ICD-10-CM | POA: Diagnosis not present

## 2022-07-16 DIAGNOSIS — N1831 Chronic kidney disease, stage 3a: Secondary | ICD-10-CM | POA: Diagnosis not present

## 2022-07-16 DIAGNOSIS — I10 Essential (primary) hypertension: Secondary | ICD-10-CM | POA: Diagnosis not present

## 2022-07-16 DIAGNOSIS — R5383 Other fatigue: Secondary | ICD-10-CM | POA: Diagnosis not present

## 2022-07-16 DIAGNOSIS — E1122 Type 2 diabetes mellitus with diabetic chronic kidney disease: Secondary | ICD-10-CM | POA: Diagnosis not present

## 2022-07-23 DIAGNOSIS — E1122 Type 2 diabetes mellitus with diabetic chronic kidney disease: Secondary | ICD-10-CM | POA: Diagnosis not present

## 2022-07-23 DIAGNOSIS — G40909 Epilepsy, unspecified, not intractable, without status epilepticus: Secondary | ICD-10-CM | POA: Diagnosis not present

## 2022-07-23 DIAGNOSIS — N1831 Chronic kidney disease, stage 3a: Secondary | ICD-10-CM | POA: Diagnosis not present

## 2022-07-23 DIAGNOSIS — E782 Mixed hyperlipidemia: Secondary | ICD-10-CM | POA: Diagnosis not present

## 2022-07-23 DIAGNOSIS — I10 Essential (primary) hypertension: Secondary | ICD-10-CM | POA: Diagnosis not present

## 2022-07-23 DIAGNOSIS — K219 Gastro-esophageal reflux disease without esophagitis: Secondary | ICD-10-CM | POA: Diagnosis not present

## 2022-07-29 DIAGNOSIS — M79672 Pain in left foot: Secondary | ICD-10-CM | POA: Diagnosis not present

## 2022-07-29 DIAGNOSIS — M722 Plantar fascial fibromatosis: Secondary | ICD-10-CM | POA: Diagnosis not present

## 2022-09-22 DIAGNOSIS — Z125 Encounter for screening for malignant neoplasm of prostate: Secondary | ICD-10-CM | POA: Diagnosis not present

## 2022-09-29 DIAGNOSIS — R350 Frequency of micturition: Secondary | ICD-10-CM | POA: Diagnosis not present

## 2022-09-29 DIAGNOSIS — N401 Enlarged prostate with lower urinary tract symptoms: Secondary | ICD-10-CM | POA: Diagnosis not present

## 2022-10-23 ENCOUNTER — Other Ambulatory Visit: Payer: Self-pay | Admitting: Neurology

## 2022-11-19 DIAGNOSIS — G40909 Epilepsy, unspecified, not intractable, without status epilepticus: Secondary | ICD-10-CM | POA: Diagnosis not present

## 2022-11-19 DIAGNOSIS — E782 Mixed hyperlipidemia: Secondary | ICD-10-CM | POA: Diagnosis not present

## 2022-11-19 DIAGNOSIS — E1122 Type 2 diabetes mellitus with diabetic chronic kidney disease: Secondary | ICD-10-CM | POA: Diagnosis not present

## 2022-12-06 ENCOUNTER — Telehealth: Payer: Self-pay | Admitting: Adult Health

## 2022-12-06 DIAGNOSIS — K219 Gastro-esophageal reflux disease without esophagitis: Secondary | ICD-10-CM | POA: Diagnosis not present

## 2022-12-06 DIAGNOSIS — N1831 Chronic kidney disease, stage 3a: Secondary | ICD-10-CM | POA: Diagnosis not present

## 2022-12-06 DIAGNOSIS — E1122 Type 2 diabetes mellitus with diabetic chronic kidney disease: Secondary | ICD-10-CM | POA: Diagnosis not present

## 2022-12-06 DIAGNOSIS — I10 Essential (primary) hypertension: Secondary | ICD-10-CM | POA: Diagnosis not present

## 2022-12-06 DIAGNOSIS — Z23 Encounter for immunization: Secondary | ICD-10-CM | POA: Diagnosis not present

## 2022-12-06 DIAGNOSIS — E782 Mixed hyperlipidemia: Secondary | ICD-10-CM | POA: Diagnosis not present

## 2022-12-06 DIAGNOSIS — G40909 Epilepsy, unspecified, not intractable, without status epilepticus: Secondary | ICD-10-CM | POA: Diagnosis not present

## 2022-12-06 MED ORDER — LAMOTRIGINE 200 MG PO TABS: 200.0000 mg | ORAL_TABLET | Freq: Two times a day (BID) | ORAL | 1 refills | Status: DC

## 2022-12-06 NOTE — Telephone Encounter (Signed)
Pt is requesting a refill for lamoTRIgine (LAMICTAL) 200 MG tablet.  Pharmacy: [facility]

## 2022-12-06 NOTE — Telephone Encounter (Signed)
Rx refill sent to [facility]. Pending visit with Dr Brett Fairy on 05/05/23.

## 2022-12-31 DIAGNOSIS — H40033 Anatomical narrow angle, bilateral: Secondary | ICD-10-CM | POA: Diagnosis not present

## 2022-12-31 DIAGNOSIS — H2513 Age-related nuclear cataract, bilateral: Secondary | ICD-10-CM | POA: Diagnosis not present

## 2022-12-31 DIAGNOSIS — H02055 Trichiasis without entropian left lower eyelid: Secondary | ICD-10-CM | POA: Diagnosis not present

## 2022-12-31 DIAGNOSIS — H04123 Dry eye syndrome of bilateral lacrimal glands: Secondary | ICD-10-CM | POA: Diagnosis not present

## 2022-12-31 DIAGNOSIS — H0102B Squamous blepharitis left eye, upper and lower eyelids: Secondary | ICD-10-CM | POA: Diagnosis not present

## 2022-12-31 DIAGNOSIS — H43393 Other vitreous opacities, bilateral: Secondary | ICD-10-CM | POA: Diagnosis not present

## 2022-12-31 DIAGNOSIS — H0102A Squamous blepharitis right eye, upper and lower eyelids: Secondary | ICD-10-CM | POA: Diagnosis not present

## 2023-01-10 DIAGNOSIS — M545 Low back pain, unspecified: Secondary | ICD-10-CM | POA: Diagnosis not present

## 2023-03-08 ENCOUNTER — Other Ambulatory Visit: Payer: Self-pay | Admitting: Adult Health

## 2023-03-08 NOTE — Addendum Note (Signed)
Addended by: Raynald Kemp A on: 03/08/2023 05:18 PM   Modules accepted: Orders

## 2023-03-08 NOTE — Telephone Encounter (Signed)
Pt is requesting a refill for clonazePAM (KLONOPIN) 0.5 MG tablet. ° °Pharmacy: [facility]  ° ° °

## 2023-03-09 MED ORDER — CLONAZEPAM 0.5 MG PO TABS: 0.2500 mg | ORAL_TABLET | Freq: Every evening | ORAL | 0 refills | Status: DC | PRN

## 2023-03-22 ENCOUNTER — Telehealth: Payer: Self-pay | Admitting: Neurology

## 2023-03-22 ENCOUNTER — Encounter: Payer: Self-pay | Admitting: Neurology

## 2023-03-22 NOTE — Telephone Encounter (Signed)
LVM and sent letter in mail informing pt of need to reschedule 05/05/23 appt - MD out

## 2023-05-05 ENCOUNTER — Ambulatory Visit: Payer: Medicare Other | Admitting: Adult Health

## 2023-05-05 ENCOUNTER — Ambulatory Visit: Payer: PRIVATE HEALTH INSURANCE | Admitting: Neurology

## 2023-06-19 ENCOUNTER — Other Ambulatory Visit: Payer: Self-pay | Admitting: Adult Health

## 2023-06-27 ENCOUNTER — Emergency Department (HOSPITAL_BASED_OUTPATIENT_CLINIC_OR_DEPARTMENT_OTHER)
Admission: EM | Admit: 2023-06-27 | Discharge: 2023-06-27 | Disposition: A | Discharge status: Home / Self Care | Payer: Medicare Other | Source: Home / Self Care | Attending: Emergency Medicine | Admitting: Emergency Medicine

## 2023-06-27 ENCOUNTER — Other Ambulatory Visit: Payer: Self-pay

## 2023-06-27 ENCOUNTER — Telehealth: Payer: Self-pay | Admitting: Neurology

## 2023-06-27 ENCOUNTER — Encounter (HOSPITAL_BASED_OUTPATIENT_CLINIC_OR_DEPARTMENT_OTHER): Payer: Self-pay

## 2023-06-27 DIAGNOSIS — G4700 Insomnia, unspecified: Secondary | ICD-10-CM | POA: Insufficient documentation

## 2023-06-27 DIAGNOSIS — Z760 Encounter for issue of repeat prescription: Secondary | ICD-10-CM | POA: Insufficient documentation

## 2023-06-27 MED ORDER — LAMOTRIGINE 25 MG PO TABS
25.0000 mg | ORAL_TABLET | Freq: Once | ORAL | Status: AC
  Administered 2023-06-27: 25 mg via ORAL
  Filled 2023-06-27: qty 1

## 2023-06-27 MED ORDER — LAMOTRIGINE 25 MG PO TABS: 25.0000 mg | ORAL_TABLET | Freq: Two times a day (BID) | ORAL | 0 refills | Status: DC

## 2023-06-27 MED ORDER — LAMOTRIGINE 25 MG PO TABS: 50.0000 mg | ORAL_TABLET | Freq: Two times a day (BID) | ORAL | 0 refills | Status: DC

## 2023-06-27 MED ORDER — LAMOTRIGINE 200 MG PO TABS: 200.0000 mg | ORAL_TABLET | Freq: Two times a day (BID) | ORAL | 0 refills | Status: DC

## 2023-06-27 NOTE — Telephone Encounter (Signed)
Patient requested after hours refill on lamotrigine to be sent to [facility] in [place].  Rx sent to pharmacy of choice, patient has an upcoming appointment with Dr. Vickey Huger.

## 2023-06-27 NOTE — ED Triage Notes (Addendum)
Pt states that he has been unable to sleep well x1 week. Pt states that he takes clonazepam to help with sleep but it is not helping anymore.  Pt also states that he has absence seizures and has been out of his lamictal x2 weeks.

## 2023-06-27 NOTE — ED Notes (Signed)
Urine specimen in lab 

## 2023-06-27 NOTE — ED Provider Notes (Signed)
[facility] Provider Note   CSN: [id number] Arrival date & time: 06/27/23  1557     History {Add pertinent medical, surgical, social history, OB history to HPI:1} Chief Complaint  Patient presents with  . Insomnia  . Medication Refill    Kenneth Ortega is a 72 y.o. male.   Insomnia  Medication Refill 72 year old male history of seizures on Lamictal presenting for insomnia and running out of medication.  He states he recently traveled to [place].  About 1 week ago he ran out of Lamictal.  He reports for last 2 weeks has had some difficulty with sleeping.  Is been taking clonazepam and gabapentin intermittently with some help but is only sleeping 3 to 4 hours per night.  Has not had difficulty sleep like this before.  He has had no other changes recently, no recent illness, fevers, chills.  No pain.  He feels sometimes like he has some brain fog.  He has not had any focal weakness, numbness, vision changes.  He is feeling somewhat better right now.  He did get his Lamictal refilled, 200 mg twice daily was not restarted yet.  He has a follow-up with his neurologist on Wednesday.  He has history of absence seizure's of does not always know when he has a seizure but is not had any known clinical seizures.     Home Medications Prior to Admission medications   Medication Sig Start Date End Date Taking? Authorizing Provider  BENICAR 5 MG tablet Take 5 mg by mouth at bedtime.  08/04/15   [provider]  Clobetasol Prop Emollient Base (CLOBETASOL PROPIONATE E) 0.05 % emollient cream Apply 1 application topically 2 (two) times daily. Patient not taking: Reported on 04/29/2022 07/06/21   Janalyn Harder, MD  clonazePAM (KLONOPIN) 0.5 MG tablet Take 0.5-1 tablets (0.25-0.5 mg total) by mouth at bedtime as needed for anxiety. For REM BD - use as needed. 03/09/23   Butch Penny, NP  dapagliflozin propanediol (FARXIGA) 5 MG TABS tablet Take 5 mg  by mouth daily.  09/11/19   [provider]  FLUoxetine (PROZAC) 20 MG capsule Take 20 mg by mouth daily. 06/09/13   [provider]  gabapentin (NEURONTIN) 300 MG capsule Take 1 capsule (300 mg total) by mouth at bedtime. 06/25/21   Butch Penny, NP  lamoTRIgine (LAMICTAL) 200 MG tablet Take 1 tablet (200 mg total) by mouth 2 (two) times daily. 06/27/23   Huston Foley, MD  simvastatin (ZOCOR) 10 MG tablet Take 10 mg by mouth at bedtime.  03/10/20   [provider]  simvastatin (ZOCOR) 20 MG tablet Take 20 mg by mouth daily.  06/09/15   [provider]  tamsulosin (FLOMAX) 0.4 MG CAPS capsule Take 0.4 mg by mouth daily.    [provider]  traZODone (DESYREL) 50 MG tablet Take 1 tablet (50 mg total) by mouth at bedtime. 10/28/21   Butch Penny, NP      Allergies    Tylox [oxycodone-acetaminophen], Keppra [levetiracetam], and Oxycontin [oxycodone]    Review of Systems   Review of Systems  Psychiatric/Behavioral:  The patient has insomnia.   Review of systems completed and notable as per HPI.  ROS otherwise negative.   Physical Exam Updated Vital Signs BP 134/88 (BP Location: Left Arm)   Pulse 69   Temp 98.6 F (37 C)   Resp 18   Ht 5\' 10"  (1.778 m)   Wt 88.9 kg   SpO2  97%   BMI 28.12 kg/m  Physical Exam Vitals and nursing note reviewed.  Constitutional:      General: He is not in acute distress.    Appearance: He is well-developed.  HENT:     Head: Normocephalic and atraumatic.  Eyes:     Conjunctiva/sclera: Conjunctivae normal.  Cardiovascular:     Rate and Rhythm: Normal rate and regular rhythm.     Heart sounds: No murmur heard. Pulmonary:     Effort: Pulmonary effort is normal. No respiratory distress.     Breath sounds: Normal breath sounds.  Abdominal:     Palpations: Abdomen is soft.     Tenderness: There is no abdominal tenderness.  Musculoskeletal:        General: No swelling.     Cervical back: Neck supple.   Skin:    General: Skin is warm and dry.     Capillary Refill: Capillary refill takes less than 2 seconds.  Neurological:     General: No focal deficit present.     Mental Status: He is alert and oriented to person, place, and time. Mental status is at baseline.     Cranial Nerves: No cranial nerve deficit.     Sensory: No sensory deficit.     Motor: No weakness.     Coordination: Coordination normal.     Gait: Gait normal.     Deep Tendon Reflexes: Reflexes normal.  Psychiatric:        Mood and Affect: Mood normal.     ED Results / Procedures / Treatments   Labs (all labs ordered are listed, but only abnormal results are displayed) Labs Reviewed - No data to display  EKG None  Radiology No results found.  Procedures Procedures  {Document cardiac monitor, telemetry assessment procedure when appropriate:1}  Medications Ordered in ED Medications - No data to display  ED Course/ Medical Decision Making/ A&P Clinical Course as of 06/27/23 1919  Mon Jun 27, 2023  1915 Pharmacy: taper, 50 mg BID then taper up [JD]    Clinical Course User Index [JD] Laurence Spates, MD   {   Click here for ABCD2, HEART and other calculatorsREFRESH Note before signing :1}                              Medical Decision Making  Medical Decision Making:   Kenneth Ortega is a 72 y.o. male who presented to the ED today with    {crccomplexity:27900} Reviewed and confirmed nursing documentation for past medical history, family history, social history.   Patient's presentation is most consistent with {EM COPA:27473}     {Document critical care time when appropriate:1} {Document review of labs and clinical decision tools ie heart score, Chads2Vasc2 etc:1}  {Document your independent review of radiology images, and any outside records:1} {Document your discussion with family members, caretakers, and with consultants:1} {Document social determinants of health affecting pt's  care:1} {Document your decision making why or why not admission, treatments were needed:1} Final Clinical Impression(s) / ED Diagnoses Final diagnoses:  None    Rx / DC Orders ED Discharge Orders     None

## 2023-06-27 NOTE — Discharge Instructions (Signed)
I recommend you restart the lamotrigine at 25 mg twice daily.  You should follow-up with neurology on Wednesday as discussed, if you develop seizures or any other new concerning symptoms you should return to the ED.

## 2023-06-27 NOTE — ED Notes (Signed)
ED Provider at bedside. 

## 2023-06-28 ENCOUNTER — Other Ambulatory Visit: Payer: Self-pay | Admitting: Neurology

## 2023-06-28 MED ORDER — LAMOTRIGINE 25 MG PO TABS: 25.0000 mg | ORAL_TABLET | Freq: Two times a day (BID) | ORAL | 3 refills | Status: DC

## 2023-06-28 NOTE — Progress Notes (Signed)
Just found this message and refilled for a 90 day supply with 3 refills.

## 2023-06-29 ENCOUNTER — Encounter: Payer: Self-pay | Admitting: Neurology

## 2023-06-29 ENCOUNTER — Ambulatory Visit (INDEPENDENT_AMBULATORY_CARE_PROVIDER_SITE_OTHER): Payer: Medicare Other | Admitting: Neurology

## 2023-06-29 ENCOUNTER — Other Ambulatory Visit: Payer: Self-pay | Admitting: Neurology

## 2023-06-29 VITALS — BP 122/78 | HR 68 | Ht 70.0 in | Wt 187.0 lb

## 2023-06-29 DIAGNOSIS — M835 Other drug-induced osteomalacia in adults: Secondary | ICD-10-CM | POA: Diagnosis not present

## 2023-06-29 DIAGNOSIS — G4733 Obstructive sleep apnea (adult) (pediatric): Secondary | ICD-10-CM

## 2023-06-29 DIAGNOSIS — Z5181 Encounter for therapeutic drug level monitoring: Secondary | ICD-10-CM | POA: Diagnosis not present

## 2023-06-29 DIAGNOSIS — G4730 Sleep apnea, unspecified: Secondary | ICD-10-CM | POA: Diagnosis not present

## 2023-06-29 DIAGNOSIS — M816 Localized osteoporosis [Lequesne]: Secondary | ICD-10-CM | POA: Diagnosis not present

## 2023-06-29 DIAGNOSIS — G4700 Insomnia, unspecified: Secondary | ICD-10-CM | POA: Diagnosis not present

## 2023-06-29 DIAGNOSIS — R569 Unspecified convulsions: Secondary | ICD-10-CM

## 2023-06-29 MED ORDER — LAMOTRIGINE 200 MG PO TABS: 200.0000 mg | ORAL_TABLET | Freq: Two times a day (BID) | ORAL | 3 refills | Status: DC

## 2023-06-29 NOTE — Progress Notes (Signed)
Provider:  Melvyn Novas, MD  Primary Care Physician:  Georgianne Fick, MD [address] [place] [address] [place] [address]     Referring Provider: Georgianne Fick, Md [address] [place],  [place] [address]          Chief Complaint according to patient   Patient presents with:     Out of medication / Patient (Initial Visit)           HISTORY OF PRESENT ILLNESS:  Kenneth Ortega is a 72 y.o. male patient formerly treated by Dr Anne Hahn, who is here for revisit 06/29/2023 for absence seizures, Insomnia and REM BD.   Chief concern according to patient :  He ran out off Lamictal for 14 days, [facility] because  his new script was for 30 days, not 90 days (?). He now is on 25 mg bid to start titration. Used to take 200 mg bid.   We plan for a fast re-titration- spelled ut in his  wrap-up.     Kenneth Ortega had spent several months this summer abroad and learned about 3 weeks ago that his Lamictal was not automatically refilled by his mail order pharmacy because of a change in duration so he was switched from a 90-day supply to 30-day supply and apparently this will not be filled.  However, he is over 2 weeks out of Lamictal total and used to take 200 mg twice a day.  Called on Labor day to [facility] and she refilled [facility] meds to local [facility] as requested.  What he currently has on hand is a 25 mg twice daily regimen that would like him to go today to take 50 mg at night,  tomorrow 50 mg in the morning Thursday go to 75 mg tomorrow night,  and 75 mg the morning ( Friday ) after. From thereon he will take 100 mg in form of a high of 200 mg tablet twice a day for 8 days and then we do 200 mg twice daily from there on.  This is a very fast we titration but the patient had been on the medication for years and I really want a faster retitration than I would give a novice/ new to the medication.  He has known REM BD- on klonopin, and he  has to take Lamictal back on 200 mg bid. # days.  Refilled both drugs.    Today 04/29/22:   Kenneth Ortega is a 72 year old male with a history of obstructive sleep apnea on CPAP and seizures.  He returns today for follow-up.  Reports that the CPAP is working well.  His download is below     Seizures:  Continue on Lamictal 200 mg BID.  He reports that he "has seizures all the time."  When asked to further explain he states that when he had an EEG in the past he was told by Dr. Anne Hahn that he has seizures all the time although he may not realize he is having them.  He does report that he has not had any known seizure events that he knows of or his wife has witnessed.  Patient does not have an EEG in our system. nsomnia: Continues to struggle with insomnia.  States that it can take him 2 to 3 hours to fall asleep.  He has tried Ambien in the past and it worked well but he felt that he got addicted to the  medication.  He has been taking gabapentin 300 mg at bedtime.  Recently he is increased to 600 mg at bedtime and taking Benadryl 2/5 mg.  He reports that sometimes this works but not consistently.  On occasion he will have to take Klonopin.  He does not like taking Klonopin nightly because he wakes up with a headache.  He does not watch television, will get his phone or tablet before bedtime.   06/25/21: Kenneth Ortega is a 72 year old male with a history of obstructive sleep apnea on ASV.  He returns today for follow-up.  He reports that the machine is working well for him.  Denies any issues with machine   Seizures: Currently on Lamictal 200 mg twice a day.  Lamictal level in January was in normal range.  Denies any seizure events.   Insomnia: Continues to struggle with insomnia.  Dr. Anne Hahn started the patient on gabapentin after a knee surgery to help with sleep.  Patient reports that he does not take gabapentin nightly.  He reports that he has clonazepam and reports that he also does not take it nightly.   He states that most nights he tries to go to bed without use of medication.  If he wakes up after 1 to 2 hours of sleep he may take gabapentin or clonazepam if he is unable to fall back to sleep.  Patient does not watch television in bed.  Typically goes to bed around 9-10 PM and wakes up at 7 AM.  Reports that most mornings he wakes up but after he eats breakfast he is sleepy.  Review of Systems: Out of a complete 14 system review, the patient complains of only the following symptoms, and all other reviewed systems are negative.:  Fatigue, sleepiness , snoring,Insomnia, RLS seizures, absence    How likely are you to doze in the following situations: 0 = not likely, 1 = slight chance, 2 = moderate chance, 3 = high chance   Sitting and Reading? Watching Television? Sitting inactive in a public place (theater or meeting)? As a passenger in a car for an hour without a break? Lying down in the afternoon when circumstances permit? Sitting and talking to someone? Sitting quietly after lunch without alcohol? In a car, while stopped for a few minutes in traffic?   Total = 0/ 24 points   FSS endorsed at 58/ 63 points.   Social History   Socioeconomic History   Marital status: Married    Spouse name: Rinaldo Cloud   Number of children: 0   Years of education: HS   Highest education level: Not on file  Occupational History   Occupation: Retired    Associate Professor: [employer]  Tobacco Use   Smoking status: Never   Smokeless tobacco: Never  Vaping Use   Vaping status: Never Used  Substance and Sexual Activity   Alcohol use: Yes    Alcohol/week: 0.0 - 1.0 standard drinks of alcohol    Comment: OCC   Drug use: No   Sexual activity: Yes  Other Topics Concern   Not on file  Social History Narrative   Patient lives at home with his wife Rinaldo Cloud). Patient is retired. High school education.   Right handed.   Caffeine- one or two cups daily   Social Determinants of Health   Financial Resource  Strain: Not on file  Food Insecurity: Not on file  Transportation Needs: Not on file  Physical Activity: Not on file  Stress: Not on file  Social  Connections: Not on file    Family History  Problem Relation Age of Onset   Cancer Mother    Heart Problems Mother    Throat cancer Father    Diabetes Sister    Sleep apnea Sister     Past Medical History:  Diagnosis Date   Atypical mole 10/29/2009   moderate-back   Atypical mole 07/22/2015   moderate- left shoulder   Atypical mole 02/16/2017   moderate-mid back   Atypical mole 12/29/2020   left lower back severe - widershave   Basal cell carcinoma 04/30/2009   right back- ([id number])   Basal cell carcinoma 10/20/2016   nod-left forehead (exc)   Basal cell carcinoma 11/18/2016   nod- left forehead (+margin) (MOHS)   Cancer (HCC)    Skin, melanoma   back  and basal cell forhead   Chronic renal insufficiency    Complication of anesthesia    BP flucuates up and down  , dizziness    Depression    Diabetes mellitus without complication (HCC)    Dyslipidemia    Headache(784.0)    Migraine as child   History of kidney stones    Hypertension    Melanoma (HCC) 03/14/2012   level III- right upper back (dr Roseanne Reno)   Memory disturbance    Obstructive sleep apnea on CPAP    Prostatitis    Renal cell carcinoma of left kidney (HCC)    removed 2007   Rotator cuff tear    Left   Rotator cuff tear    left shoulder    Seizure (HCC)    hx since 1996/7 ; per patient , has short bursts of seizures characterized with expressive aphasia, managed by neurologist Dr Anne Hahn    Past Surgical History:  Procedure Laterality Date   Arthroscopic surgery     Left knee   INCISIONAL HERNIA REPAIR     Left flank   INGUINAL HERNIA REPAIR Right 05/22/2020   Procedure: OPEN RIGHT INGUINAL HERNIA REPAIR;  Surgeon: Ovidio Kin, MD;  Location: [facility];  Service: General;  Laterality: Right;   KIDNEY SURGERY Left 2007   Removed due to cancer; per  patient " my other kidney is functioning great right now"   Pilonidal cyst resection     SKIN CANCER DESTRUCTION     TOTAL KNEE ARTHROPLASTY Left 07/18/2017   Procedure: LEFT TOTAL KNEE ARTHROPLASTY;  Surgeon: Ollen Gross, MD;  Location: [facility];  Service: Orthopedics;  Laterality: Left;     Current Outpatient Medications on File Prior to Visit  Medication Sig Dispense Refill   atorvastatin (LIPITOR) 40 MG tablet Take 40 mg by mouth daily.     BENICAR 5 MG tablet Take 5 mg by mouth at bedtime.      Clobetasol Prop Emollient Base (CLOBETASOL PROPIONATE E) 0.05 % emollient cream Apply 1 application topically 2 (two) times daily. 60 g 3   clonazePAM (KLONOPIN) 0.5 MG tablet Take 0.5-1 tablets (0.25-0.5 mg total) by mouth at bedtime as needed for anxiety. For REM BD - use as needed. 90 tablet 0   dapagliflozin propanediol (FARXIGA) 5 MG TABS tablet Take 5 mg by mouth daily.      FLUoxetine (PROZAC) 20 MG capsule Take 20 mg by mouth daily.     gabapentin (NEURONTIN) 300 MG capsule Take 1 capsule (300 mg total) by mouth at bedtime. 90 capsule 3   lamoTRIgine (LAMICTAL) 25 MG tablet Take 1 tablet (25 mg total) by mouth 2 (two) times daily for  7 days. 14 tablet 0   lamoTRIgine (LAMICTAL) 25 MG tablet Take 1 tablet (25 mg total) by mouth 2 (two) times daily. 60 tablet 3   omeprazole (PRILOSEC) 40 MG capsule Take 40 mg by mouth daily.     tamsulosin (FLOMAX) 0.4 MG CAPS capsule Take 0.4 mg by mouth daily.     traZODone (DESYREL) 50 MG tablet Take 1 tablet (50 mg total) by mouth at bedtime. 30 tablet 5   No current facility-administered medications on file prior to visit.    Allergies  Allergen Reactions   Tylox [Oxycodone-Acetaminophen] Other (See Comments)    Hallucinations/insomnia . Does not tolerate any of the "oxys". Prefers hydrocodone   Keppra [Levetiracetam] Other (See Comments)    Patient unsure of reaction   Oxycontin [Oxycodone] Other (See Comments)    hallucinations       DIAGNOSTIC DATA (LABS, IMAGING, TESTING) - I reviewed patient records, labs, notes, testing and imaging myself where available.  Lab Results  Component Value Date   WBC 5.3 04/29/2022   HGB 14.2 04/29/2022   HCT 43.3 04/29/2022   MCV 93 04/29/2022   PLT 181 04/29/2022      Component Value Date/Time   NA 141 04/29/2022 1031   K 5.0 04/29/2022 1031   CL 103 04/29/2022 1031   CO2 24 04/29/2022 1031   GLUCOSE 114 (H) 04/29/2022 1031   GLUCOSE 77 05/14/2020 1450   BUN 24 04/29/2022 1031   CREATININE 1.48 (H) 04/29/2022 1031   CALCIUM 9.4 04/29/2022 1031   PROT 6.8 04/29/2022 1031   ALBUMIN 4.7 04/29/2022 1031   AST 16 04/29/2022 1031   ALT 19 04/29/2022 1031   ALKPHOS 64 04/29/2022 1031   BILITOT 0.3 04/29/2022 1031   GFRNONAA 49 (L) 11/06/2020 1022   GFRAA 56 (L) 11/06/2020 1022   Lab Results  Component Value Date   CHOL  07/20/2007    159        ATP III CLASSIFICATION:  <200     mg/dL   Desirable  621-308  mg/dL   Borderline High  >=657    mg/dL   High   HDL 15 (L) 84/69/6295   LDLCALC  07/20/2007    85        Total Cholesterol/HDL:CHD Risk Coronary Heart Disease Risk Table                     Men   Women  1/2 Average Risk   3.4   3.3   TRIG 295 (H) 07/20/2007   CHOLHDL 10.6 07/20/2007   Lab Results  Component Value Date   HGBA1C 6.4 (H) 05/14/2020   No results found for: "VITAMINB12" Lab Results  Component Value Date   TSH 0.241 Test methodology is 3rd generation TSH (L) 07/19/2007    PHYSICAL EXAM:  Today's Vitals   06/29/23 1013  BP: 122/78  Pulse: 68  Weight: 187 lb (84.8 kg)  Height: 5\' 10"  (1.778 m)   Body mass index is 26.83 kg/m.   Wt Readings from Last 3 Encounters:  06/29/23 187 lb (84.8 kg)  06/27/23 196 lb (88.9 kg)  04/29/22 196 lb 6.4 oz (89.1 kg)     Ht Readings from Last 3 Encounters:  06/29/23 5\' 10"  (1.778 m)  06/27/23 5\' 10"  (1.778 m)  04/29/22 5\' 10"  (1.778 m)      General: The patient is awake, alert and appears not  in acute distress. The patient is well groomed. Head: Normocephalic, atraumatic.  NWell developed, in no acute distress      Neurological examination  Mentation: Alert oriented to time, place, history taking. Follows all commands speech and language fluent Cranial nerve : Puils are equal . Extraocular movements were full, visual field were full on confrontational test Head turning and shoulder shrug  were normal and symmetric. Motor:full strength of all 4 extremities- symmetric motor tone is noted throughout.  Sensory: intact to soft touch on all 4 extremities.  Gait and station: Gait is normal.      ASSESSMENT AND PLAN 72 y.o. year old male  here with:  0) SA on ASV  Compliance is 100% with 10 hours of nightly use, this is an ASV machine between and minimum pressure support of 3 cm water maximum pressure support of 11 cm water and an expiratory pressure setting of 14 cm water.    Residual AHI is 2.4 and hypopnea index 4.3 culminating to a 6.7/h.  This is still much preferable over the baseline.  Airleak is moderate at 16.6 L/min.  The patient remains a restless sleeper but lorazepam has in the past helped however he is on clonazepam for REM behavior disorder and that mixture of 2 benzodiazepines is not a preferred treatment.   1) This patient ran out of seizure meds for 15 days. Has  nocturnal and daytime seizures, 3-5 seconds, see EEG reports " Lamictal , goal is to go back on 200 mg bid, titration spelled out.    2) REM BD treated with Klonopin, Melatonin was not tolerated    3) Bone density test for adult male on anticonvulsant therapy.   4) Insomnia is there forever, may be anxiety or racing thoughts related. Not clear that this is organic.    Plan follow up through our NP within 6 months.   I would like to thank Butch Penny, NP and  Georgianne Fick, MD for allowing me to meet with and to take care of this pleasant patient.     After spending a total time of  30   minutes face to face and additional time for physical and neurologic examination, review of laboratory studies,  personal review of imaging studies, reports and results of other testing and review of referral information / records as far as provided in visit,   Electronically signed by: Melvyn Novas, MD 06/29/2023 10:41 AM  [facility] Board certified by The [employer] of Sleep Medicine and Diplomate of the [employer] of Sleep Medicine. Board certified In Neurology through the ABPN, Fellow of the [employer] of Neu[employer].

## 2023-06-29 NOTE — Progress Notes (Signed)
Refilled for long term 200 mg lamictal bid, 90 days

## 2023-06-29 NOTE — Patient Instructions (Addendum)
[name] had spent several months this summer abroad and learned about 3 weeks ago that his Lamictal was not automatically refilled by his mail order pharmacy because of a change in duration so he was switched from a 90-day supply to 30-day supply and apparently this will not be filled.  However he is about over 2 weeks out of Lamictal total and used to take 200 mg twice a day.  What he currently has on hand is a 25 mg twice daily regimen that would like him to go today to take 50 mg at night tomorrow 50 mg in the morning then go to 75 mg tomorrow night and 75 mg the morning after. From thereon he will take 100 mg in form of a high of 200 mg tablet twice a day for 8 days and then we do 200 mg twice daily from there on.  This is a very fast we titration but the patient had been on the medication for years and I really want a faster retitration than I would give a novice/ new to the medication.   Lamotrigine Tablets What is this medication? LAMOTRIGINE (la MOE Kenneth Ortega) prevents and controls seizures in people with epilepsy. It may also be used to treat bipolar disorder. It works by calming overactive nerves in your body. This medicine may be used for other purposes; ask your health care provider or pharmacist if you have questions. COMMON BRAND NAME(S): Lamictal, Subvenite What should I tell my care team before I take this medication? They need to know if you have any of these conditions: Heart disease History of irregular heartbeat Immune system problems Kidney disease Liver disease Low levels of folic acid in the blood Lupus Mental health condition Suicidal thoughts, plans, or attempt by you or a family member An unusual or allergic reaction to lamotrigine, other medications, foods, dyes, or preservatives Pregnant or trying to get pregnant Breastfeeding How should I use this medication? Take this medication by mouth with a glass of water. Follow the directions on the prescription label.  Do not chew these tablets. If this medication upsets your stomach, take it with food or milk. Take your doses at regular intervals. Do not take your medication more often than directed. A special MedGuide will be given to you by the pharmacist with each new prescription and refill. Be sure to read this information carefully each time. Talk to your care team about the use of this medication in children. While this medication may be prescribed for children as young as 2 years for selected conditions, precautions do apply. Overdosage: If you think you have taken too much of this medicine contact a poison control center or emergency room at once. NOTE: This medicine is only for you. Do not share this medicine with others. What if I miss a dose? If you miss a dose, take it as soon as you can. If it is almost time for your next dose, take only that dose. Do not take double or extra doses. What may interact with this medication? Atazanavir Certain medications for irregular heartbeat Certain medications for seizures, such as carbamazepine, phenobarbital, phenytoin, primidone, or valproic acid Estrogen or progestin hormones Lopinavir Rifampin Ritonavir This list may not describe all possible interactions. Give your health care provider a list of all the medicines, herbs, non-prescription drugs, or dietary supplements you use. Also tell them if you smoke, drink alcohol, or use illegal drugs. Some items may interact with your medicine. What should I watch for while  using this medication? Visit your care team for regular checks on your progress. If you take this medication for seizures, wear a Medic Alert bracelet or necklace. Carry an identification card with information about your condition, medications, and care team. It is important to take this medication exactly as directed. When first starting treatment, your dose will need to be adjusted slowly. It may take weeks or months before your dose is stable.  You should contact your care team if your seizures get worse or if you have any new types of seizures. Do not stop taking this medication unless instructed by your care team. Stopping your medication suddenly can increase your seizures or their severity. This medication may cause serious skin reactions. They can happen weeks to months after starting the medication. Contact your care team right away if you notice fevers or flu-like symptoms with a rash. The rash may be red or purple and then turn into blisters or peeling of the skin. You may also notice a red rash with swelling of the face, lips, or lymph nodes in your neck or under your arms. This medication may affect your coordination, reaction time, or judgment. Do not drive or operate machinery until you know how this medication affects you. Sit up or stand slowly to reduce the risk of dizzy or fainting spells. Drinking alcohol with this medication can increase the risk of these side effects. If you are taking this medication for bipolar disorder, it is important to report any changes in your mood to your care team. If your condition gets worse, you get mentally depressed, feel very hyperactive or manic, have difficulty sleeping, or have thoughts of hurting yourself or committing suicide, you need to get help from your care team right away. If you are a caregiver for someone taking this medication for bipolar disorder, you should also report these behavioral changes right away. The use of this medication may increase the chance of suicidal thoughts or actions. Pay special attention to how you are responding while on this medication. Your mouth may get dry. Chewing sugarless gum or sucking hard candy and drinking plenty of water may help. Contact your care team if the problem does not go away or is severe. If you become pregnant while using this medication, you may enroll in the [place] American Antiepileptic Drug Pregnancy Registry by calling [phone].  This registry collects information about the safety of antiepileptic medication use during pregnancy. This medication may cause a decrease in folic acid. You should make sure that you get enough folic acid while you are taking this medication. Discuss the foods you eat and the vitamins you take with your care team. What side effects may I notice from receiving this medication? Side effects that you should report to your care team as soon as possible: Allergic reactions--skin rash, itching, hives, swelling of the face, lips, tongue, or throat Change in vision Fever, neck pain or stiffness, sensitivity to light, headache, nausea, vomiting, confusion Heart rhythm changes--fast or irregular heartbeat, dizziness, feeling faint or lightheaded, chest pain, trouble breathing Infection--fever, chills, cough, or sore throat Liver injury--right upper belly pain, loss of appetite, nausea, light-colored stool, dark yellow or brown urine, yellowing skin or eyes, unusual weakness or fatigue Low red blood cell count--unusual weakness or fatigue, dizziness, headache, trouble breathing Rash, fever, and swollen lymph nodes Redness, blistering, peeling or loosening of the skin, including inside the mouth Thoughts of suicide or self-harm, worsening mood, or feelings of depression Unusual bruising or bleeding Side  effects that usually do not require medical attention (report to your care team if they continue or are bothersome): Diarrhea Dizziness Drowsiness Headache Nausea Stomach pain Tremors or shaking This list may not describe all possible side effects. Call your doctor for medical advice about side effects. You may report side effects to FDA at 1-800-FDA-1088. Where should I keep my medication? Keep out of the reach of children and pets. Store at [employer] [facility] (77 degrees F). Protect from light. Get rid of any unused medication after the expiration date. To get rid of medications that are no longer needed  or have expired: Take the medication to a medication take-back program. Check with your pharmacy or law enforcement to find a location. If you cannot return the medication, check the label or package insert to see if the medication should be thrown out in the garbage or flushed down the toilet. If you are not sure, ask your care team. If it is safe to put it in the trash, empty the medication out of the container. Mix the medication with cat litter, dirt, coffee grounds, or other unwanted substance. Seal the mixture in a bag or container. Put it in the trash. NOTE: This sheet is a summary. It may not cover all possible information. If you have questions about this medicine, talk to your doctor, pharmacist, or health care provider.  2024 Elsevier/Gold Standard (2022-04-20 00:00:00)

## 2023-07-04 ENCOUNTER — Telehealth: Payer: Self-pay

## 2023-07-04 NOTE — Telephone Encounter (Signed)
Pt said he has 25 mg have 25 tablets and 200 mg have 60 tablets.

## 2023-07-04 NOTE — Telephone Encounter (Signed)
Called Patient LVM to give [place] a call back to discuss Rx refill. *IF PATIENT CALLS BACK PLEASE VERIFY HOW MANY LAMOTRIGINE 25MG  DOES HE HAVE. IF PATIENT STATES HE HAS MORE THEN 48 PILLS THEN PATIENT HAS ENOUGH UNTIL SATURDAY WERE HE WILL RECEIVED A NEW DOSE.*

## 2023-07-18 DIAGNOSIS — I10 Essential (primary) hypertension: Secondary | ICD-10-CM | POA: Diagnosis not present

## 2023-07-18 DIAGNOSIS — K219 Gastro-esophageal reflux disease without esophagitis: Secondary | ICD-10-CM | POA: Diagnosis not present

## 2023-07-18 DIAGNOSIS — R5383 Other fatigue: Secondary | ICD-10-CM | POA: Diagnosis not present

## 2023-07-18 DIAGNOSIS — G40909 Epilepsy, unspecified, not intractable, without status epilepticus: Secondary | ICD-10-CM | POA: Diagnosis not present

## 2023-07-18 DIAGNOSIS — N1831 Chronic kidney disease, stage 3a: Secondary | ICD-10-CM | POA: Diagnosis not present

## 2023-07-18 DIAGNOSIS — E782 Mixed hyperlipidemia: Secondary | ICD-10-CM | POA: Diagnosis not present

## 2023-07-18 DIAGNOSIS — E1122 Type 2 diabetes mellitus with diabetic chronic kidney disease: Secondary | ICD-10-CM | POA: Diagnosis not present

## 2023-07-25 DIAGNOSIS — E782 Mixed hyperlipidemia: Secondary | ICD-10-CM | POA: Diagnosis not present

## 2023-07-25 DIAGNOSIS — N1831 Chronic kidney disease, stage 3a: Secondary | ICD-10-CM | POA: Diagnosis not present

## 2023-07-25 DIAGNOSIS — G40909 Epilepsy, unspecified, not intractable, without status epilepticus: Secondary | ICD-10-CM | POA: Diagnosis not present

## 2023-07-25 DIAGNOSIS — K219 Gastro-esophageal reflux disease without esophagitis: Secondary | ICD-10-CM | POA: Diagnosis not present

## 2023-07-25 DIAGNOSIS — Z23 Encounter for immunization: Secondary | ICD-10-CM | POA: Diagnosis not present

## 2023-07-25 DIAGNOSIS — Z0000 Encounter for general adult medical examination without abnormal findings: Secondary | ICD-10-CM | POA: Diagnosis not present

## 2023-07-25 DIAGNOSIS — I10 Essential (primary) hypertension: Secondary | ICD-10-CM | POA: Diagnosis not present

## 2023-07-25 DIAGNOSIS — E1122 Type 2 diabetes mellitus with diabetic chronic kidney disease: Secondary | ICD-10-CM | POA: Diagnosis not present

## 2023-10-20 DIAGNOSIS — J40 Bronchitis, not specified as acute or chronic: Secondary | ICD-10-CM | POA: Diagnosis not present

## 2023-10-27 ENCOUNTER — Other Ambulatory Visit: Payer: Self-pay

## 2023-10-27 DIAGNOSIS — N401 Enlarged prostate with lower urinary tract symptoms: Secondary | ICD-10-CM | POA: Diagnosis not present

## 2023-10-27 DIAGNOSIS — R350 Frequency of micturition: Secondary | ICD-10-CM | POA: Diagnosis not present

## 2023-10-27 MED ORDER — CLONAZEPAM 0.5 MG PO TABS: 0.2500 mg | ORAL_TABLET | Freq: Every evening | ORAL | 1 refills | Status: DC | PRN

## 2023-10-27 NOTE — Progress Notes (Signed)
 Please make upcoming appointment with Butch Penny, NP

## 2023-10-31 NOTE — Progress Notes (Signed)
 Patient has been placed for Office visit with Megan on 12/28/22.

## 2023-11-09 DIAGNOSIS — D123 Benign neoplasm of transverse colon: Secondary | ICD-10-CM | POA: Diagnosis not present

## 2023-11-09 DIAGNOSIS — K648 Other hemorrhoids: Secondary | ICD-10-CM | POA: Diagnosis not present

## 2023-11-09 DIAGNOSIS — K5730 Diverticulosis of large intestine without perforation or abscess without bleeding: Secondary | ICD-10-CM | POA: Diagnosis not present

## 2023-11-09 DIAGNOSIS — D124 Benign neoplasm of descending colon: Secondary | ICD-10-CM | POA: Diagnosis not present

## 2023-11-09 DIAGNOSIS — Z09 Encounter for follow-up examination after completed treatment for conditions other than malignant neoplasm: Secondary | ICD-10-CM | POA: Diagnosis not present

## 2023-11-09 DIAGNOSIS — Z860100 Personal history of colon polyps, unspecified: Secondary | ICD-10-CM | POA: Diagnosis not present

## 2023-11-09 DIAGNOSIS — K635 Polyp of colon: Secondary | ICD-10-CM | POA: Diagnosis not present

## 2023-11-15 DIAGNOSIS — K635 Polyp of colon: Secondary | ICD-10-CM | POA: Diagnosis not present

## 2023-11-15 DIAGNOSIS — D124 Benign neoplasm of descending colon: Secondary | ICD-10-CM | POA: Diagnosis not present

## 2023-11-15 DIAGNOSIS — D123 Benign neoplasm of transverse colon: Secondary | ICD-10-CM | POA: Diagnosis not present

## 2023-11-18 DIAGNOSIS — N183 Chronic kidney disease, stage 3 unspecified: Secondary | ICD-10-CM | POA: Diagnosis not present

## 2023-12-14 DIAGNOSIS — D225 Melanocytic nevi of trunk: Secondary | ICD-10-CM | POA: Diagnosis not present

## 2023-12-14 DIAGNOSIS — Z85820 Personal history of malignant melanoma of skin: Secondary | ICD-10-CM | POA: Diagnosis not present

## 2023-12-14 DIAGNOSIS — L821 Other seborrheic keratosis: Secondary | ICD-10-CM | POA: Diagnosis not present

## 2023-12-14 DIAGNOSIS — D485 Neoplasm of uncertain behavior of skin: Secondary | ICD-10-CM | POA: Diagnosis not present

## 2023-12-14 DIAGNOSIS — L814 Other melanin hyperpigmentation: Secondary | ICD-10-CM | POA: Diagnosis not present

## 2023-12-14 DIAGNOSIS — L570 Actinic keratosis: Secondary | ICD-10-CM | POA: Diagnosis not present

## 2023-12-26 NOTE — Progress Notes (Signed)
 Setup date 12/03/2020

## 2023-12-27 ENCOUNTER — Ambulatory Visit (INDEPENDENT_AMBULATORY_CARE_PROVIDER_SITE_OTHER): Admitting: Adult Health

## 2023-12-27 ENCOUNTER — Encounter: Payer: Self-pay | Admitting: Adult Health

## 2023-12-27 ENCOUNTER — Other Ambulatory Visit: Payer: Self-pay | Admitting: *Deleted

## 2023-12-27 VITALS — BP 143/85 | HR 64 | Ht 70.0 in | Wt 194.0 lb

## 2023-12-27 DIAGNOSIS — G4700 Insomnia, unspecified: Secondary | ICD-10-CM

## 2023-12-27 DIAGNOSIS — G4730 Sleep apnea, unspecified: Secondary | ICD-10-CM

## 2023-12-27 DIAGNOSIS — G4733 Obstructive sleep apnea (adult) (pediatric): Secondary | ICD-10-CM

## 2023-12-27 DIAGNOSIS — R569 Unspecified convulsions: Secondary | ICD-10-CM

## 2023-12-27 DIAGNOSIS — Z5181 Encounter for therapeutic drug level monitoring: Secondary | ICD-10-CM | POA: Diagnosis not present

## 2023-12-27 NOTE — Progress Notes (Signed)
 PATIENT: Kenneth Ortega DOB: Jun 21, 1951  REASON FOR VISIT: follow up HISTORY FROM: patient PRIMARY NEUROLOGIST: Dr. Vickey Huger  Chief Complaint  Patient presents with   Rm 19    Patient is here alone for seizure and osa follow-up. He states he still has absent seizures but "the medication controls them". He states he is unaware when he has them unless it goes on a little longer then he will be aware. He states even what he calls "a long one" is still short. He is states he has no problems using his cpap each day.      HISTORY OF PRESENT ILLNESS: Today 12/27/23:  Kenneth Ortega is a 73 y.o. male with a history of OSA on CPAP and seizures. Returns today for follow-up.   Seizures: reports that he is not aware of any known seizure events. Reports that he will sometimes lose concentration but that's only for a second. Denies staring events. No convulsive seizures. Remains on Lamictal 200 mg BID.   OSA on ASV: reports that ASV is woring well. No new issues. DL is below.   Insomnia/REM BD: takes klonopin only has needed. Works well.       04/29/22: Kenneth Ortega is a 73 year old male with a history of obstructive sleep apnea on CPAP and seizures.  He returns today for follow-up.  Reports that the CPAP is working well.  His download is below   Seizures:  Continue on Lamictal 200 mg BID.  He reports that he "has seizures all the time."  When asked to further explain he states that when he had an EEG in the past he was told by Dr. Anne Hahn that he has seizures all the time although he may not realize he is having them.  He does report that he has not had any known seizure events that he knows of or his wife has witnessed.  Patient does not have an EEG in our system.    10/27/2021 Kenneth Ortega is a 73 year old male with a history of obstructive sleep /apnea insomnia and seizures.  He returns today for follow-up.  He did not bring his CPAP with him.  But reports that he does use it  nightly.  Seizures: Stable no seizure events.  Remains on Lamictal 200 mg twice a day  Insomnia: Continues to struggle with insomnia.  States that it can take him 2 to 3 hours to fall asleep.  He has tried Ambien in the past and it worked well but he felt that he got addicted to the medication.  He has been taking gabapentin 300 mg at bedtime.  Recently he is increased to 600 mg at bedtime and taking Benadryl 2/5 mg.  He reports that sometimes this works but not consistently.  On occasion he will have to take Klonopin.  He does not like taking Klonopin nightly because he wakes up with a headache.  He does not watch television, will get his phone or tablet before bedtime.  06/25/21: Kenneth Ortega is a 73 year old male with a history of obstructive sleep apnea on ASV.  He returns today for follow-up.  He reports that the machine is working well for him.  Denies any issues with machine  Seizures: Currently on Lamictal 200 mg twice a day.  Lamictal level in January was in normal range.  Denies any seizure events.  Insomnia: Continues to struggle with insomnia.  Dr. Anne Hahn started the patient on gabapentin after a knee surgery to help with sleep.  Patient  reports that he does not take gabapentin nightly.  He reports that he has clonazepam and reports that he also does not take it nightly.  He states that most nights he tries to go to bed without use of medication.  If he wakes up after 1 to 2 hours of sleep he may take gabapentin or clonazepam if he is unable to fall back to sleep.  Patient does not watch television in bed.  Typically goes to bed around 9-10 PM and wakes up at 7 AM.  Reports that most mornings he wakes up but after he eats breakfast he is sleepy.    HISTORY (Copied from Dr.Dohmeier's note) 60 - year- old  Caucasian male patient seen here after his wife requested a transfer of care to me on 11/06/2020 . He was established with Dr Anne Hahn, who is now part-time. He has developed a more severe  insomnia problem over the last 12 month . He is often able to go to sleep, but wakes up after only 3 hours of sleep, feeling alert- but then suffers through the day feeling unrested , unrefreshed.   Chief concern according to patient : I have had nasal congestion over 50 years- and have seizures all day- absence spells. He wonders if his seizures are what keeps him from sleeping. Dr Anne Hahn had done several EEG s with absence documented. heis first manifestation of a seizure followed insomnia for 3 days- the sleep deprivation put him over the edge.  There was a time he was dependent on AMBIEN and he has weaned off started Prozac, which helped to sleep, too.      I have the pleasure of seeing Kenneth Ortega today, a right-handed White or Caucasian male with OSA and insomnia, nasal deviated septum. He uses nasal mist every night-  He  has a past medical history of Atypical mole (10/29/2009), Atypical mole (07/22/2015), Atypical mole (02/16/2017), Basal cell carcinoma (04/30/2009), Basal cell carcinoma (10/20/2016), Basal cell carcinoma (11/18/2016), Cancer (HCC), Chronic renal insufficiency, Complication of anesthesia, Depression, Diabetes mellitus without complication (HCC), Dyslipidemia, Headache(784.0), History of kidney stones, Hypertension, Melanoma (HCC) (03/14/2012), Memory disturbance, Obstructive sleep apnea on CPAP, Prostatitis, Renal cell carcinoma of left kidney (HCC), monokidney- last GFR 09-2020 [facility] was 52/h,  and absence Seizure (HCC).   The patient had the first sleep study in the year 4/ 2017, with a result of an AHI ( Apnea Hypopnea index)  of 21/h. He did not come back for an ordered CPAP titration in 2020.  Kenneth Ortega has been very compliant use of his CPAP under percent and times a day for 9 hours and 48 minutes on average use of CPAP is set to 10 cm 12 cm of water was 3 cm expiratory pressure relief his residual AHI is 2.8 which is excellent he does have a moderate to high air leak at  the 95th percentile of 28.6 L/min.  It does not seem to bother him that much.  His mask is in nasal mask he is using a [employer] ESON, which is a nasal mask.      REVIEW OF SYSTEMS: Out of a complete 14 system review of symptoms, the patient complains only of the following symptoms, and all other reviewed systems are negative.  See HPI   ALLERGIES: Allergies  Allergen Reactions   Tylox [Oxycodone-Acetaminophen] Other (See Comments)    Hallucinations/insomnia . Does not tolerate any of the "oxys". Prefers hydrocodone   Keppra [Levetiracetam] Other (See Comments)  Patient unsure of reaction   Oxycontin [Oxycodone] Other (See Comments)    hallucinations     HOME MEDICATIONS: Outpatient Medications Prior to Visit  Medication Sig Dispense Refill   atorvastatin (LIPITOR) 40 MG tablet Take 40 mg by mouth daily.     BENICAR 5 MG tablet Take 5 mg by mouth at bedtime.      Clobetasol Prop Emollient Base (CLOBETASOL PROPIONATE E) 0.05 % emollient cream Apply 1 application topically 2 (two) times daily. 60 g 3   clonazePAM (KLONOPIN) 0.5 MG tablet Take 0.5-1 tablets (0.25-0.5 mg total) by mouth at bedtime as needed for anxiety. For REM BD - use as needed. 90 tablet 1   dapagliflozin propanediol (FARXIGA) 5 MG TABS tablet Take 5 mg by mouth daily.      FLUoxetine (PROZAC) 20 MG capsule Take 20 mg by mouth daily.     gabapentin (NEURONTIN) 300 MG capsule Take 1 capsule (300 mg total) by mouth at bedtime. 90 capsule 3   lamoTRIgine (LAMICTAL) 200 MG tablet Take 1 tablet (200 mg total) by mouth 2 (two) times daily. 180 tablet 3   lamoTRIgine (LAMICTAL) 25 MG tablet Take 1 tablet (25 mg total) by mouth 2 (two) times daily. 60 tablet 3   omeprazole (PRILOSEC) 40 MG capsule Take 40 mg by mouth daily.     tamsulosin (FLOMAX) 0.4 MG CAPS capsule Take 0.4 mg by mouth daily.     traZODone (DESYREL) 50 MG tablet Take 1 tablet (50 mg total) by mouth at bedtime. 30 tablet 5   No  facility-administered medications prior to visit.    PAST MEDICAL HISTORY: Past Medical History:  Diagnosis Date   Atypical mole 10/29/2009   moderate-back   Atypical mole 07/22/2015   moderate- left shoulder   Atypical mole 02/16/2017   moderate-mid back   Atypical mole 12/29/2020   left lower back severe - widershave   Basal cell carcinoma 04/30/2009   right back- ([id number])   Basal cell carcinoma 10/20/2016   nod-left forehead (exc)   Basal cell carcinoma 11/18/2016   nod- left forehead (+margin) (MOHS)   Cancer (HCC)    Skin, melanoma   back  and basal cell forhead   Chronic renal insufficiency    Complication of anesthesia    BP flucuates up and down  , dizziness    Depression    Diabetes mellitus without complication (HCC)    Dyslipidemia    Headache(784.0)    Migraine as child   History of kidney stones    Hypertension    Melanoma (HCC) 03/14/2012   level III- right upper back (dr Roseanne Reno)   Memory disturbance    Obstructive sleep apnea on CPAP    Prostatitis    Renal cell carcinoma of left kidney (HCC)    removed 2007   Rotator cuff tear    Left   Rotator cuff tear    left shoulder    Seizure (HCC)    hx since 1996/7 ; per patient , has short bursts of seizures characterized with expressive aphasia, managed by neurologist Dr Anne Hahn    PAST SURGICAL HISTORY: Past Surgical History:  Procedure Laterality Date   Arthroscopic surgery     Left knee   INCISIONAL HERNIA REPAIR     Left flank   INGUINAL HERNIA REPAIR Right 05/22/2020   Procedure: OPEN RIGHT INGUINAL HERNIA REPAIR;  Surgeon: Ovidio Kin, MD;  Location: [facility];  Service: General;  Laterality: Right;   KIDNEY SURGERY Left 2007  Removed due to cancer; per patient " my other kidney is functioning great right now"   Pilonidal cyst resection     SKIN CANCER DESTRUCTION     TOTAL KNEE ARTHROPLASTY Left 07/18/2017   Procedure: LEFT TOTAL KNEE ARTHROPLASTY;  Surgeon: Ollen Gross, MD;  Location: [facility];  Service: Orthopedics;  Laterality: Left;    FAMILY HISTORY: Family History  Problem Relation Age of Onset   Cancer Mother    Heart Problems Mother    Throat cancer Father    Diabetes Sister    Sleep apnea Sister     SOCIAL HISTORY: Social History   Socioeconomic History   Marital status: Married    Spouse name: Rinaldo Cloud   Number of children: 0   Years of education: HS   Highest education level: Not on file  Occupational History   Occupation: Retired    Associate Professor: [employer]  Tobacco Use   Smoking status: Never   Smokeless tobacco: Never  Vaping Use   Vaping status: Never Used  Substance and Sexual Activity   Alcohol use: Yes    Alcohol/week: 0.0 - 1.0 standard drinks of alcohol    Comment: OCC   Drug use: No   Sexual activity: Yes  Other Topics Concern   Not on file  Social History Narrative   Patient lives at home with his wife Rinaldo Cloud). Patient is retired. High school education.   Right handed.   Caffeine- one or two cups daily   Social Drivers of Health   Financial Resource Strain: Not on file  Food Insecurity: Low Risk  (11/18/2023)   Received from [facility]   Hunger Vital Sign    Worried About Running Out of Food in the Last Year: Never true    Ran Out of Food in the Last Year: Never true  Transportation Needs: No Transportation Needs (11/18/2023)   Received from [facility]    In the past 12 months, has lack of reliable transportation kept you from medical appointments, meetings, work or from getting things needed for daily living? : No  Physical Activity: Not on file  Stress: Not on file  Social Connections: Not on file  Intimate Partner Violence: Not on file      PHYSICAL EXAM  Vitals:   12/27/23 0822  BP: (!) 143/85  Pulse: 64  Weight: 194 lb (88 kg)  Height: 5\' 10"  (1.778 m)    Body mass index is 27.84 kg/m. Generalized: Well developed, in no acute distress    Neurological examination  Mentation:  Alert oriented to time, place, history taking. Follows all commands speech and language fluent Cranial nerve II-XII: Extraocular movements were full, visual field were full on confrontational test Head turning and shoulder shrug  were normal and symmetric. Motor: The motor testing reveals 5 over 5 strength of all 4 extremities. Good symmetric motor tone is noted throughout.  Sensory: Sensory testing is intact to soft touch on all 4 extremities. No evidence of extinction is noted.  Gait and station: Gait is normal.    DIAGNOSTIC DATA (LABS, IMAGING, TESTING) - I reviewed patient records, labs, notes, testing and imaging myself where available.  Lab Results  Component Value Date   WBC 5.3 04/29/2022   HGB 14.2 04/29/2022   HCT 43.3 04/29/2022   MCV 93 04/29/2022   PLT 181 04/29/2022      Component Value Date/Time   NA 141 04/29/2022 1031   K 5.0 04/29/2022 1031   CL  103 04/29/2022 1031   CO2 24 04/29/2022 1031   GLUCOSE 114 (H) 04/29/2022 1031   GLUCOSE 77 05/14/2020 1450   BUN 24 04/29/2022 1031   CREATININE 1.48 (H) 04/29/2022 1031   CALCIUM 9.4 04/29/2022 1031   PROT 6.8 04/29/2022 1031   ALBUMIN 4.7 04/29/2022 1031   AST 16 04/29/2022 1031   ALT 19 04/29/2022 1031   ALKPHOS 64 04/29/2022 1031   BILITOT 0.3 04/29/2022 1031   GFRNONAA 49 (L) 11/06/2020 1022   GFRAA 56 (L) 11/06/2020 1022   Lab Results  Component Value Date   CHOL  07/20/2007    159        ATP III CLASSIFICATION:  <200     mg/dL   Desirable  161-096  mg/dL   Borderline High  >=045    mg/dL   High   HDL 15 (L) 40/98/1191   LDLCALC  07/20/2007    85        Total Cholesterol/HDL:CHD Risk Coronary Heart Disease Risk Table                     Men   Women  1/2 Average Risk   3.4   3.3   TRIG 295 (H) 07/20/2007   CHOLHDL 10.6 07/20/2007   Lab Results  Component Value Date   HGBA1C 6.4 (H) 05/14/2020   No results found for: "VITAMINB12"     ASSESSMENT AND PLAN 73 y.o. year old male  has a past  medical history of Atypical mole (10/29/2009), Atypical mole (07/22/2015), Atypical mole (02/16/2017), Atypical mole (12/29/2020), Basal cell carcinoma (04/30/2009), Basal cell carcinoma (10/20/2016), Basal cell carcinoma (11/18/2016), Cancer (HCC), Chronic renal insufficiency, Complication of anesthesia, Depression, Diabetes mellitus without complication (HCC), Dyslipidemia, Headache(784.0), History of kidney stones, Hypertension, Melanoma (HCC) (03/14/2012), Memory disturbance, Obstructive sleep apnea on CPAP, Prostatitis, Renal cell carcinoma of left kidney (HCC), Rotator cuff tear, Rotator cuff tear, and Seizure (HCC). here with:  OSA on ASV Good compliance Good treatment of apnea Encouraged the patient to use CPAP nightly greater than 4 hours each night  2.  Seizures  Continue Lamictal 200 mg twice a day  3. REM BD/Insomnia  Okay to take clonazepam 0.25-0.5 mg at bedtime as needed   Follow-up in 6 months or sooner if needed   Butch Penny, MSN, NP-C 12/27/2023, 7:44 AM [facility] [address] [place], [place] [address] [phone]

## 2023-12-28 ENCOUNTER — Ambulatory Visit: Payer: Medicare Other | Admitting: Adult Health

## 2024-01-02 MED ORDER — CLONAZEPAM 0.5 MG PO TABS: 0.2500 mg | ORAL_TABLET | Freq: Every evening | ORAL | 0 refills | Status: DC | PRN

## 2024-01-02 MED ORDER — LAMOTRIGINE 200 MG PO TABS: 200.0000 mg | ORAL_TABLET | Freq: Two times a day (BID) | ORAL | 3 refills | Status: DC

## 2024-01-06 DIAGNOSIS — H04123 Dry eye syndrome of bilateral lacrimal glands: Secondary | ICD-10-CM | POA: Diagnosis not present

## 2024-01-06 DIAGNOSIS — H40033 Anatomical narrow angle, bilateral: Secondary | ICD-10-CM | POA: Diagnosis not present

## 2024-01-06 DIAGNOSIS — H2513 Age-related nuclear cataract, bilateral: Secondary | ICD-10-CM | POA: Diagnosis not present

## 2024-01-06 DIAGNOSIS — E119 Type 2 diabetes mellitus without complications: Secondary | ICD-10-CM | POA: Diagnosis not present

## 2024-01-06 DIAGNOSIS — H43393 Other vitreous opacities, bilateral: Secondary | ICD-10-CM | POA: Diagnosis not present

## 2024-01-30 DIAGNOSIS — N1831 Chronic kidney disease, stage 3a: Secondary | ICD-10-CM | POA: Diagnosis not present

## 2024-01-30 DIAGNOSIS — E782 Mixed hyperlipidemia: Secondary | ICD-10-CM | POA: Diagnosis not present

## 2024-01-30 DIAGNOSIS — E1122 Type 2 diabetes mellitus with diabetic chronic kidney disease: Secondary | ICD-10-CM | POA: Diagnosis not present

## 2024-01-30 DIAGNOSIS — K219 Gastro-esophageal reflux disease without esophagitis: Secondary | ICD-10-CM | POA: Diagnosis not present

## 2024-01-30 DIAGNOSIS — G40909 Epilepsy, unspecified, not intractable, without status epilepticus: Secondary | ICD-10-CM | POA: Diagnosis not present

## 2024-01-30 DIAGNOSIS — R5383 Other fatigue: Secondary | ICD-10-CM | POA: Diagnosis not present

## 2024-01-30 DIAGNOSIS — I10 Essential (primary) hypertension: Secondary | ICD-10-CM | POA: Diagnosis not present

## 2024-02-06 DIAGNOSIS — E782 Mixed hyperlipidemia: Secondary | ICD-10-CM | POA: Diagnosis not present

## 2024-02-06 DIAGNOSIS — I10 Essential (primary) hypertension: Secondary | ICD-10-CM | POA: Diagnosis not present

## 2024-02-06 DIAGNOSIS — N1831 Chronic kidney disease, stage 3a: Secondary | ICD-10-CM | POA: Diagnosis not present

## 2024-02-06 DIAGNOSIS — K219 Gastro-esophageal reflux disease without esophagitis: Secondary | ICD-10-CM | POA: Diagnosis not present

## 2024-02-06 DIAGNOSIS — G40909 Epilepsy, unspecified, not intractable, without status epilepticus: Secondary | ICD-10-CM | POA: Diagnosis not present

## 2024-02-06 DIAGNOSIS — E1122 Type 2 diabetes mellitus with diabetic chronic kidney disease: Secondary | ICD-10-CM | POA: Diagnosis not present

## 2024-04-02 DIAGNOSIS — D123 Benign neoplasm of transverse colon: Secondary | ICD-10-CM | POA: Diagnosis not present

## 2024-04-02 DIAGNOSIS — K5140 Inflammatory polyps of colon without complications: Secondary | ICD-10-CM | POA: Diagnosis not present

## 2024-04-02 DIAGNOSIS — Z1211 Encounter for screening for malignant neoplasm of colon: Secondary | ICD-10-CM | POA: Diagnosis not present

## 2024-04-02 DIAGNOSIS — D128 Benign neoplasm of rectum: Secondary | ICD-10-CM | POA: Diagnosis not present

## 2024-04-02 DIAGNOSIS — Z79899 Other long term (current) drug therapy: Secondary | ICD-10-CM | POA: Diagnosis not present

## 2024-04-02 DIAGNOSIS — Z860101 Personal history of adenomatous and serrated colon polyps: Secondary | ICD-10-CM | POA: Diagnosis not present

## 2024-04-02 DIAGNOSIS — K621 Rectal polyp: Secondary | ICD-10-CM | POA: Diagnosis not present

## 2024-06-08 DIAGNOSIS — E782 Mixed hyperlipidemia: Secondary | ICD-10-CM | POA: Diagnosis not present

## 2024-06-08 DIAGNOSIS — N1831 Chronic kidney disease, stage 3a: Secondary | ICD-10-CM | POA: Diagnosis not present

## 2024-06-08 DIAGNOSIS — I10 Essential (primary) hypertension: Secondary | ICD-10-CM | POA: Diagnosis not present

## 2024-06-08 DIAGNOSIS — E1122 Type 2 diabetes mellitus with diabetic chronic kidney disease: Secondary | ICD-10-CM | POA: Diagnosis not present

## 2024-06-08 DIAGNOSIS — G40909 Epilepsy, unspecified, not intractable, without status epilepticus: Secondary | ICD-10-CM | POA: Diagnosis not present

## 2024-06-19 DIAGNOSIS — I10 Essential (primary) hypertension: Secondary | ICD-10-CM | POA: Diagnosis not present

## 2024-06-19 DIAGNOSIS — E1122 Type 2 diabetes mellitus with diabetic chronic kidney disease: Secondary | ICD-10-CM | POA: Diagnosis not present

## 2024-06-19 DIAGNOSIS — E782 Mixed hyperlipidemia: Secondary | ICD-10-CM | POA: Diagnosis not present

## 2024-06-19 DIAGNOSIS — N1831 Chronic kidney disease, stage 3a: Secondary | ICD-10-CM | POA: Diagnosis not present

## 2024-06-19 DIAGNOSIS — G40909 Epilepsy, unspecified, not intractable, without status epilepticus: Secondary | ICD-10-CM | POA: Diagnosis not present

## 2024-06-19 DIAGNOSIS — K219 Gastro-esophageal reflux disease without esophagitis: Secondary | ICD-10-CM | POA: Diagnosis not present

## 2024-11-30 ENCOUNTER — Encounter: Payer: Self-pay | Admitting: Neurology

## 2024-11-30 ENCOUNTER — Ambulatory Visit: Admitting: Neurology

## 2024-11-30 VITALS — BP 126/78 | HR 58 | Ht 70.0 in | Wt 191.8 lb

## 2024-11-30 DIAGNOSIS — Z79899 Other long term (current) drug therapy: Secondary | ICD-10-CM | POA: Insufficient documentation

## 2024-11-30 DIAGNOSIS — T8189XA Other complications of procedures, not elsewhere classified, initial encounter: Secondary | ICD-10-CM

## 2024-11-30 DIAGNOSIS — R9401 Abnormal electroencephalogram [EEG]: Secondary | ICD-10-CM

## 2024-11-30 DIAGNOSIS — G40909 Epilepsy, unspecified, not intractable, without status epilepticus: Secondary | ICD-10-CM

## 2024-11-30 DIAGNOSIS — M835 Other drug-induced osteomalacia in adults: Secondary | ICD-10-CM | POA: Insufficient documentation

## 2024-11-30 DIAGNOSIS — G40209 Localization-related (focal) (partial) symptomatic epilepsy and epileptic syndromes with complex partial seizures, not intractable, without status epilepticus: Secondary | ICD-10-CM

## 2024-11-30 DIAGNOSIS — Z9189 Other specified personal risk factors, not elsewhere classified: Secondary | ICD-10-CM | POA: Insufficient documentation

## 2024-11-30 MED ORDER — LAMOTRIGINE 200 MG PO TABS: 200.0000 mg | ORAL_TABLET | Freq: Two times a day (BID) | ORAL | 3 refills | Status: AC

## 2024-11-30 MED ORDER — CLONAZEPAM 0.5 MG PO TABS: 0.2500 mg | ORAL_TABLET | Freq: Every evening | ORAL | 1 refills | Status: AC | PRN

## 2024-11-30 MED ORDER — TRAZODONE HCL 50 MG PO TABS: 50.0000 mg | ORAL_TABLET | Freq: Every day | ORAL | 5 refills | Status: DC

## 2024-11-30 NOTE — Progress Notes (Unsigned)
 SABRA

## 2024-11-30 NOTE — Progress Notes (Unsigned)
 "        Provider:  Dedra Gores, MD  Primary Care Physician:  Verdia Lombard, MD [address] [place] [address] [place] [address]     Referring Provider: Verdia Lombard, Md [address] [place],  [place] [address]          Chief Complaint according to patient   Patient presents with:                HISTORY OF PRESENT ILLNESS:  Kenneth Ortega is a 74 y.o. male patient who is here for revisit 11/30/2024 for  routine  seizure medication refills and management. He has scoliosis, back pain, and he has osteopenia, likely medication induced.  His EEG was abnormal, Frequent generalized spike and slow wave discharges, up to 3Hz , some of the discharges are in run of up to 4 seconds.    Impression: This is an abnormal EEG recording in the waking and drowsy state due to generalized epileptiform discharges which are consistent with generalized epilepsy.   Plan : he will not be able to wean off seizure meds , we have to find the least bone toxic.  He is also here for CPAP follow up,  he is on ASV because of complex sleep apnea. His AHI is well controlled,  EEP is 14 cm water, min PS 3 cm and max PS of 11 cm.       Chief concern according to patient :   I have a lot of back pain, sleep OK, and need refills.    Kenneth Ortega is a 74 y.o. male patient formerly treated by Dr Kenneth Ortega, who is here for revisit 06/29/2023 for absence seizures, Insomnia and REM BD.  Chief concern according to patient :  He ran out off Lamictal  for 14 days, learnt [facility]'t [facility] it because  his new script was for 30 days, not 90 days (?). He now is on 25 mg bid to start titration. Used to take 200 mg bid.   We plan for a fast re-titration- spelled ut in his  wrap-up.      [name] had spent several months this summer abroad and learned about 3 weeks ago that his Lamictal  was not automatically refilled by his mail order pharmacy because of a change in duration  so he was switched from a 90-day supply to 30-day supply and apparently this will not be filled.  However, he is over 2 weeks out of Lamictal  total and used to take 200 mg twice a day.  Called on Labor day to [facility] and she refilled his meds to local [facility] as requested.  What he currently has on hand is a 25 mg twice daily regimen that would like him to go today to take 50 mg at night,  tomorrow 50 mg in the morning Thursday go to 75 mg tomorrow night,  and 75 mg the morning ( Friday ) after. From thereon he will take 100 mg in form of a high of 200 mg tablet twice a day for 8 days and then we do 200 mg twice daily from there on.  This is a very fast we titration but the patient had been on the medication for years and I really want a faster retitration than I would give a novice/ new to the medication.   He has known REM BD- on klonopin , and he has to take Lamictal  back on 200 mg bid. # days.  Refilled both  drugs.       Today 04/29/22: [name] is a 74 year old male with a history of obstructive sleep apnea on CPAP and seizures.  He returns today for follow-up.  Reports that the CPAP is working well.  His download is below     Seizures:  Continue on Lamictal  200 mg BID.  He reports that he has seizures all the time.  When asked to further explain he states that when he had an EEG in the past he was told by Dr. Jenel that he has seizures all the time although he may not realize he is having them.  He does report that he has not had any known seizure events that he knows of or his wife has witnessed.  Patient does not have an EEG in our system.    Review of Systems: Out of a complete 14 system review, the patient complains of only the following symptoms, and all other reviewed systems are negative.:   SLEEPINESS ?  How likely are you to doze in the following situations: 0 = not likely, 1 = slight chance, 2 = moderate chance, 3 = high chance  Sitting and Reading? Watching  Television? Sitting inactive in a public place (theater or meeting)? Lying down in the afternoon when circumstances permit? Sitting and talking to someone? Sitting quietly after lunch without alcohol? In a car, while stopped for a few minutes in traffic? As a passenger in a car for an hour without a break?  Total =  5/ 24        Social History   Socioeconomic History   Marital status: Married    Spouse name: Kenneth Ortega   Number of children: 0   Years of education: HS   Highest education level: Not on file  Occupational History   Occupation: Retired    Associate Professor: [employer]  Tobacco Use   Smoking status: Never   Smokeless tobacco: Never  Vaping Use   Vaping status: Never Used  Substance and Sexual Activity   Alcohol use: Yes    Alcohol/week: 0.0 - 1.0 standard drinks of alcohol    Comment: OCC   Drug use: No   Sexual activity: Yes  Other Topics Concern   Not on file  Social History Narrative   Patient lives at home with his wife Joesphine). Patient is retired. High school education.   Right handed.   Caffeine- one or two cups daily   Social Drivers of Health   Tobacco Use: Low Risk (11/30/2024)   Patient History    Smoking Tobacco Use: Never    Smokeless Tobacco Use: Never    Passive Exposure: Not on file  Financial Resource Strain: Not on file  Food Insecurity: Low Risk (11/18/2023)   Received from [facility]   Epic    Within the past 12 months, you worried that your food would run out before you got money to buy more: Never true    Within the past 12 months, the food you bought just didn't last and you didn't have money to get more. : Never true  Transportation Needs: No Transportation Needs (11/18/2023)   Received from [facility]    In the past 12 months, has lack of reliable transportation kept you from medical appointments, meetings, work or from getting things needed for daily living? : No  Physical Activity: Not on file  Stress:  Not on file  Social Connections: Not on file  Depression (EYV7-0): Not on file  Alcohol Screen: Not on file  Housing: Low Risk (11/18/2023)   Received from [facility]   Epic    What is your living situation today?: I have a steady place to live    Think about the place you live. Do you have problems with any of the following? Choose all that apply:: None/None on this list  Utilities: Low Risk (11/18/2023)   Received from [facility]   Utilities    In the past 12 months has the electric, gas, oil, or water company threatened to shut off services in your home? : No  Health Literacy: Not on file    Family History  Problem Relation Age of Onset   Cancer Mother    Heart Problems Mother    Throat cancer Father    Diabetes Sister    Sleep apnea Sister     Past Medical History:  Diagnosis Date   Atypical mole 10/29/2009   moderate-back   Atypical mole 07/22/2015   moderate- left shoulder   Atypical mole 02/16/2017   moderate-mid back   Atypical mole 12/29/2020   left lower back severe - widershave   Basal cell carcinoma 04/30/2009   right back- (CX35FU)   Basal cell carcinoma 10/20/2016   nod-left forehead (exc)   Basal cell carcinoma 11/18/2016   nod- left forehead (+margin) (MOHS)   Cancer (HCC)    Skin, melanoma   back  and basal cell forhead   Chronic renal insufficiency    Complication of anesthesia    BP flucuates up and down  , dizziness    Depression    Diabetes mellitus without complication (HCC)    Dyslipidemia    Headache(784.0)    Migraine as child   History of kidney stones    Hypertension    Melanoma (HCC) 03/14/2012   level III- right upper back (dr Jackquline)   Memory disturbance    Obstructive sleep apnea on CPAP    Prostatitis    Renal cell carcinoma of left kidney (HCC)    removed 2007   Rotator cuff tear    Left   Rotator cuff tear    left shoulder    Seizure (HCC)    hx since 1996/7 ; per patient , has short bursts of seizures  characterized with expressive aphasia, managed by neurologist Dr Kenneth Ortega    Past Surgical History:  Procedure Laterality Date   Arthroscopic surgery     Left knee   COLONOSCOPY     INCISIONAL HERNIA REPAIR     Left flank   INGUINAL HERNIA REPAIR Right 05/22/2020   Procedure: OPEN RIGHT INGUINAL HERNIA REPAIR;  Surgeon: Ethyl Lenis, MD;  Location: [facility];  Service: General;  Laterality: Right;   KIDNEY SURGERY Left 2007   Removed due to cancer; per patient  my other kidney is functioning great right now   Pilonidal cyst resection     SKIN CANCER DESTRUCTION     TOTAL KNEE ARTHROPLASTY Left 07/18/2017   Procedure: LEFT TOTAL KNEE ARTHROPLASTY;  Surgeon: Melodi Lerner, MD;  Location: [facility];  Service: Orthopedics;  Laterality: Left;     Medications Ordered Prior to Encounter[1]  Allergies[2]   DIAGNOSTIC DATA (LABS, IMAGING, TESTING) - I reviewed patient records, labs, notes, testing and imaging myself where available.  Lab Results  Component Value Date   WBC 5.3 04/29/2022   HGB 14.2 04/29/2022   HCT 43.3 04/29/2022   MCV 93 04/29/2022   PLT 181 04/29/2022  Component Value Date/Time   NA 141 04/29/2022 1031   K 5.0 04/29/2022 1031   CL 103 04/29/2022 1031   CO2 24 04/29/2022 1031   GLUCOSE 114 (H) 04/29/2022 1031   GLUCOSE 77 05/14/2020 1450   BUN 24 04/29/2022 1031   CREATININE 1.48 (H) 04/29/2022 1031   CALCIUM 9.4 04/29/2022 1031   PROT 6.8 04/29/2022 1031   ALBUMIN 4.7 04/29/2022 1031   AST 16 04/29/2022 1031   ALT 19 04/29/2022 1031   ALKPHOS 64 04/29/2022 1031   BILITOT 0.3 04/29/2022 1031   GFRNONAA 49 (L) 11/06/2020 1022   GFRAA 56 (L) 11/06/2020 1022   Lab Results  Component Value Date   CHOL  07/20/2007    159        ATP III CLASSIFICATION:  <200     mg/dL   Desirable  799-760  mg/dL   Borderline High  >=759    mg/dL   High   HDL 15 (L) 90/74/7991   LDLCALC  07/20/2007    85        Total Cholesterol/HDL:CHD Risk Coronary Heart Disease  Risk Table                     Men   Women  1/2 Average Risk   3.4   3.3   TRIG 295 (H) 07/20/2007   CHOLHDL 10.6 07/20/2007   Lab Results  Component Value Date   HGBA1C 6.4 (H) 05/14/2020   No results found for: VITAMINB12 Lab Results  Component Value Date   TSH 0.241 ***Test methodology is 3rd generation TSH*** (L) 07/19/2007    PHYSICAL EXAM:  Vitals:   11/30/24 1034 11/30/24 1036  BP: (!) 149/75 126/78  Pulse: (!) 58    No data found. Body mass index is 27.52 kg/m.   Wt Readings from Last 3 Encounters:  11/30/24 191 lb 12.8 oz (87 kg)  12/27/23 194 lb (88 kg)  06/29/23 187 lb (84.8 kg)     Ht Readings from Last 3 Encounters:  11/30/24 5' 10 (1.778 m)  12/27/23 5' 10 (1.778 m)  06/29/23 5' 10 (1.778 m)      General: The patient is awake, alert and appears not in acute distress and groomed. Head: Normocephalic, atraumatic.  Neck is supple. Mallampati ***,  neck circumference:*** inches .   Nasal airflow *** patent.   Overbite Dwan is *** seen.  Dental status:  Cardiovascular:  Regular rate and cardiac rhythm by pulse, without distended neck veins. Respiratory: no shortness of breath  Skin:  Without evidence of ankle edema, or rash. Trunk: BMI is ***    NEUROLOGIC EXAM: The patient is awake and alert, oriented to place and time.   Memory subjective described as intact.  Attention span & concentration ability appears normal.   Speech is fluent,  without  dysarthria, dysphonia or aphasia.  Mood and affect are appropriate.   Neurological Examination: Mental Status: Intact. Language and speech are normal. No cognitive deficits. Cranial Nerves II-XII: Intact. PERL. EOMI. VFF. No nystagmus.  No facial droop.  No ptosis.  Hearing is grossly intact bilaterally.  The tongue is normal and midline. Motor: Strengths are 5/5 throughout. Muscle bulk and tone are normal. No tremors.  Coordination: No ataxia or dysmetria.  Sensory: Grossly intact  throughout to all modalities. Reflexes: Normal and symmetric throughout. No ankle clonus. Babinski's sign is absent bilaterally. Hoffman's sign is absent bilaterally. Gait and Station: Normal. Romberg's sign is absent.   ASSESSMENT  AND PLAN :   74 y.o. year old male  here with:    1) seizure disorder , well controlled on Lamictal , refilled indef.  2) medication induced higher risk for osteopenia, needs  bone density scan-   2) ASV is 74 years old. He is not willing to undergo another sleep study and I agree, the process of obtaining a baseline, failing  CPAP and BiPAP and then going to ASV again.  If his machine indicates an end-of motor life message, he will get a new prescription outright .    RV in 6 month.    I would like to thank Verdia Lombard, MD and Verdia Lombard, Md [address] [place],  [place] [address] for allowing me to meet with this pleasant patient.   Sleep Clinic Patients are generally offered input on sleep hygiene, life style changes and how to improve compliance with medical treatment where applicable. Review and reiteration of good sleep hygiene measures is offered to any sleep clinic patient, be it in the first consultation or with any follow up visits.    Any patient with sleepiness should be cautioned not to drive, work at heights, or operate dangerous or heavy equipment when feeling tired or sleepy.      The patient will be seen in follow-up in the sleep clinic at [facility] for discussion of test results, sleep related symptoms and treatment compliance review, further management strategies, etc.   The referring provider will be notified of the test results.   The patient's condition requires frequent monitoring and adjustments in the treatment plan, reflecting the ongoing complexity of care.  This provider is the continuing focal point for all needed services for this condition.  After spending a total time of  35  minutes face to face  and time for  history taking, physical and neurologic examination, review of laboratory studies,  personal review of imaging studies, reports and results of other testing and review of referral information / records as far as provided in visit,   Electronically signed by: Dedra Gores, MD 11/30/2024 10:57 AM  [facility] Board certified by The [employer] of Sleep Medicine and Diplomate of the [employer] of Sleep Medicine. Board certified In Neurology through the ABPN, Fellow of the [employer] of Neurology.      [1]  Current Outpatient Medications on File Prior to Visit  Medication Sig Dispense Refill   atorvastatin (LIPITOR) 80 MG tablet Take 80 mg by mouth daily.     BENICAR 5 MG tablet Take 5 mg by mouth at bedtime.      Clobetasol  Prop Emollient Base (CLOBETASOL  PROPIONATE E) 0.05 % emollient cream Apply 1 application topically 2 (two) times daily. 60 g 3   clonazePAM  (KLONOPIN ) 0.5 MG tablet Take 0.5-1 tablets (0.25-0.5 mg total) by mouth at bedtime as needed for anxiety. For REM BD - use as needed. 90 tablet 0   dapagliflozin  propanediol (FARXIGA ) 5 MG TABS tablet Take 5 mg by mouth daily.      FLUoxetine  (PROZAC ) 20 MG capsule Take 20 mg by mouth daily.     gabapentin  (NEURONTIN ) 300 MG capsule Take 1 capsule (300 mg total) by mouth at bedtime. (Patient taking differently: Take 300 mg by mouth at bedtime as needed.) 90 capsule 3   lamoTRIgine  (LAMICTAL ) 200 MG tablet Take 1 tablet (200 mg total) by mouth 2 (two) times daily. 180 tablet 3   omeprazole (PRILOSEC) 40 MG capsule Take 40  mg by mouth as needed.     tamsulosin  (FLOMAX ) 0.4 MG CAPS capsule Take 0.4 mg by mouth daily.     traZODone  (DESYREL ) 50 MG tablet Take 1 tablet (50 mg total) by mouth at bedtime. 30 tablet 5   No current facility-administered medications on file prior to visit.  [2]  Allergies Allergen Reactions   Tylox Gideon.gilding ] Other (See  Comments)    Hallucinations/insomnia . Does not tolerate any of the oxys. Prefers hydrocodone    Keppra  [Levetiracetam ] Other (See Comments)    Patient unsure of reaction   Oxycontin [Oxycodone] Other (See Comments)    hallucinations    Trazodone  And Nefazodone     Stumbling and dizziness   "

## 2024-11-30 NOTE — Assessment & Plan Note (Signed)
 Nephrologist [facility]-  Dr Genette, Isaiah,

## 2025-01-01 ENCOUNTER — Ambulatory Visit: Admitting: Neurology

## 2025-12-04 ENCOUNTER — Ambulatory Visit: Admitting: Neurology
